# Patient Record
Sex: Female | Born: 1945 | Race: White | Hispanic: No | Marital: Married | Smoking: Former smoker
Health system: Southern US, Community
[De-identification: ages and names within clinical notes are randomized; demographics above are authoritative.]

## PROBLEM LIST (undated history)

## (undated) DIAGNOSIS — K648 Other hemorrhoids: Secondary | ICD-10-CM

## (undated) DIAGNOSIS — T1491XA Suicide attempt, initial encounter: Secondary | ICD-10-CM

## (undated) DIAGNOSIS — H9190 Unspecified hearing loss, unspecified ear: Secondary | ICD-10-CM

## (undated) DIAGNOSIS — Z8711 Personal history of peptic ulcer disease: Secondary | ICD-10-CM

## (undated) DIAGNOSIS — K56609 Unspecified intestinal obstruction, unspecified as to partial versus complete obstruction: Secondary | ICD-10-CM

## (undated) DIAGNOSIS — Z833 Family history of diabetes mellitus: Secondary | ICD-10-CM

## (undated) DIAGNOSIS — K5090 Crohn's disease, unspecified, without complications: Secondary | ICD-10-CM

## (undated) DIAGNOSIS — K439 Ventral hernia without obstruction or gangrene: Secondary | ICD-10-CM

## (undated) DIAGNOSIS — F329 Major depressive disorder, single episode, unspecified: Secondary | ICD-10-CM

## (undated) DIAGNOSIS — Z87442 Personal history of urinary calculi: Secondary | ICD-10-CM

## (undated) DIAGNOSIS — N390 Urinary tract infection, site not specified: Secondary | ICD-10-CM

## (undated) DIAGNOSIS — M545 Low back pain, unspecified: Secondary | ICD-10-CM

## (undated) DIAGNOSIS — D649 Anemia, unspecified: Secondary | ICD-10-CM

## (undated) DIAGNOSIS — K766 Portal hypertension: Secondary | ICD-10-CM

## (undated) DIAGNOSIS — F3289 Other specified depressive episodes: Secondary | ICD-10-CM

## (undated) DIAGNOSIS — K5900 Constipation, unspecified: Secondary | ICD-10-CM

## (undated) DIAGNOSIS — M5030 Other cervical disc degeneration, unspecified cervical region: Secondary | ICD-10-CM

## (undated) DIAGNOSIS — M797 Fibromyalgia: Secondary | ICD-10-CM

## (undated) DIAGNOSIS — K7460 Unspecified cirrhosis of liver: Secondary | ICD-10-CM

## (undated) DIAGNOSIS — Z8719 Personal history of other diseases of the digestive system: Secondary | ICD-10-CM

## (undated) DIAGNOSIS — I609 Nontraumatic subarachnoid hemorrhage, unspecified: Secondary | ICD-10-CM

## (undated) DIAGNOSIS — D509 Iron deficiency anemia, unspecified: Secondary | ICD-10-CM

## (undated) DIAGNOSIS — I10 Essential (primary) hypertension: Secondary | ICD-10-CM

## (undated) DIAGNOSIS — R161 Splenomegaly, not elsewhere classified: Secondary | ICD-10-CM

## (undated) DIAGNOSIS — E119 Type 2 diabetes mellitus without complications: Secondary | ICD-10-CM

## (undated) HISTORY — PX: PARTIAL COLECTOMY: SHX5273

## (undated) HISTORY — DX: Low back pain, unspecified: M54.50

## (undated) HISTORY — DX: Personal history of peptic ulcer disease: Z87.11

## (undated) HISTORY — DX: Nontraumatic subarachnoid hemorrhage, unspecified: I60.9

## (undated) HISTORY — DX: Personal history of urinary calculi: Z87.442

## (undated) HISTORY — DX: Low back pain: M54.5

## (undated) HISTORY — DX: Crohn's disease, unspecified, without complications: K50.90

## (undated) HISTORY — DX: Type 2 diabetes mellitus without complications: E11.9

## (undated) HISTORY — PX: HERNIA REPAIR: SHX51

## (undated) HISTORY — DX: Essential (primary) hypertension: I10

## (undated) HISTORY — DX: Morbid (severe) obesity due to excess calories: E66.01

## (undated) HISTORY — DX: Other specified depressive episodes: F32.89

## (undated) HISTORY — DX: Urinary tract infection, site not specified: N39.0

## (undated) HISTORY — DX: Fibromyalgia: M79.7

## (undated) HISTORY — DX: Splenomegaly, not elsewhere classified: R16.1

## (undated) HISTORY — DX: Portal hypertension: K76.6

## (undated) HISTORY — PX: LIVER BIOPSY: SHX301

## (undated) HISTORY — DX: Unspecified hearing loss, unspecified ear: H91.90

## (undated) HISTORY — PX: ABDOMINAL HYSTERECTOMY: SHX81

## (undated) HISTORY — DX: Personal history of other diseases of the digestive system: Z87.19

## (undated) HISTORY — DX: Ventral hernia without obstruction or gangrene: K43.9

## (undated) HISTORY — PX: OTHER SURGICAL HISTORY: SHX169

## (undated) HISTORY — PX: BREAST CYST EXCISION: SHX579

## (undated) HISTORY — DX: Other hemorrhoids: K64.8

## (undated) HISTORY — DX: Unspecified cirrhosis of liver: K74.60

## (undated) HISTORY — DX: Constipation, unspecified: K59.00

## (undated) HISTORY — DX: Other cervical disc degeneration, unspecified cervical region: M50.30

## (undated) HISTORY — PX: CHOLECYSTECTOMY: SHX55

## (undated) HISTORY — DX: Suicide attempt, initial encounter: T14.91XA

## (undated) HISTORY — PX: ORIF TIBIA & FIBULA FRACTURES: SHX2131

## (undated) HISTORY — DX: Family history of diabetes mellitus: Z83.3

## (undated) HISTORY — DX: Anemia, unspecified: D64.9

## (undated) HISTORY — DX: Unspecified intestinal obstruction, unspecified as to partial versus complete obstruction: K56.609

## (undated) HISTORY — DX: Major depressive disorder, single episode, unspecified: F32.9

## (undated) HISTORY — DX: Iron deficiency anemia, unspecified: D50.9

---

## 1998-01-21 ENCOUNTER — Inpatient Hospital Stay (HOSPITAL_COMMUNITY): Admission: EM | Admit: 1998-01-21 | Discharge: 1998-01-24 | Payer: Self-pay | Admitting: Emergency Medicine

## 1998-05-14 ENCOUNTER — Ambulatory Visit (HOSPITAL_BASED_OUTPATIENT_CLINIC_OR_DEPARTMENT_OTHER): Admission: RE | Admit: 1998-05-14 | Discharge: 1998-05-14 | Payer: Self-pay | Admitting: Orthopedic Surgery

## 1998-07-31 ENCOUNTER — Encounter: Payer: Self-pay | Admitting: Internal Medicine

## 1998-07-31 ENCOUNTER — Ambulatory Visit (HOSPITAL_COMMUNITY): Admission: RE | Admit: 1998-07-31 | Discharge: 1998-07-31 | Payer: Self-pay | Admitting: Internal Medicine

## 1998-12-18 ENCOUNTER — Other Ambulatory Visit: Admission: RE | Admit: 1998-12-18 | Discharge: 1998-12-18 | Payer: Self-pay | Admitting: Obstetrics and Gynecology

## 1999-05-07 ENCOUNTER — Encounter: Admission: RE | Admit: 1999-05-07 | Discharge: 1999-08-05 | Payer: Self-pay | Admitting: Internal Medicine

## 1999-08-19 ENCOUNTER — Encounter: Payer: Self-pay | Admitting: Internal Medicine

## 1999-08-19 ENCOUNTER — Encounter: Admission: RE | Admit: 1999-08-19 | Discharge: 1999-08-19 | Payer: Self-pay | Admitting: Internal Medicine

## 1999-11-14 ENCOUNTER — Ambulatory Visit (HOSPITAL_COMMUNITY): Admission: RE | Admit: 1999-11-14 | Discharge: 1999-11-14 | Payer: Self-pay | Admitting: Internal Medicine

## 1999-11-14 ENCOUNTER — Encounter: Payer: Self-pay | Admitting: Internal Medicine

## 2000-01-21 ENCOUNTER — Other Ambulatory Visit: Admission: RE | Admit: 2000-01-21 | Discharge: 2000-01-21 | Payer: Self-pay | Admitting: *Deleted

## 2000-01-31 ENCOUNTER — Encounter: Payer: Self-pay | Admitting: Internal Medicine

## 2000-01-31 ENCOUNTER — Encounter: Payer: Self-pay | Admitting: Emergency Medicine

## 2000-01-31 ENCOUNTER — Inpatient Hospital Stay (HOSPITAL_COMMUNITY): Admission: EM | Admit: 2000-01-31 | Discharge: 2000-02-01 | Payer: Self-pay | Admitting: Emergency Medicine

## 2000-02-24 ENCOUNTER — Ambulatory Visit (HOSPITAL_BASED_OUTPATIENT_CLINIC_OR_DEPARTMENT_OTHER): Admission: RE | Admit: 2000-02-24 | Discharge: 2000-02-24 | Payer: Self-pay

## 2000-04-04 DIAGNOSIS — T1491XA Suicide attempt, initial encounter: Secondary | ICD-10-CM

## 2000-04-04 HISTORY — DX: Suicide attempt, initial encounter: T14.91XA

## 2000-04-09 ENCOUNTER — Encounter: Payer: Self-pay | Admitting: Internal Medicine

## 2000-04-09 ENCOUNTER — Ambulatory Visit (HOSPITAL_COMMUNITY): Admission: RE | Admit: 2000-04-09 | Discharge: 2000-04-09 | Payer: Self-pay | Admitting: Internal Medicine

## 2000-04-26 ENCOUNTER — Emergency Department (HOSPITAL_COMMUNITY): Admission: EM | Admit: 2000-04-26 | Discharge: 2000-04-26 | Payer: Self-pay | Admitting: Emergency Medicine

## 2000-04-26 ENCOUNTER — Encounter: Payer: Self-pay | Admitting: Emergency Medicine

## 2000-04-28 ENCOUNTER — Ambulatory Visit (HOSPITAL_COMMUNITY): Admission: RE | Admit: 2000-04-28 | Discharge: 2000-04-28 | Payer: Self-pay | Admitting: Internal Medicine

## 2000-05-01 ENCOUNTER — Ambulatory Visit (HOSPITAL_COMMUNITY): Admission: RE | Admit: 2000-05-01 | Discharge: 2000-05-01 | Payer: Self-pay | Admitting: Internal Medicine

## 2000-05-01 ENCOUNTER — Encounter: Payer: Self-pay | Admitting: Internal Medicine

## 2000-05-27 ENCOUNTER — Encounter: Payer: Self-pay | Admitting: Internal Medicine

## 2000-05-27 ENCOUNTER — Ambulatory Visit (HOSPITAL_COMMUNITY): Admission: RE | Admit: 2000-05-27 | Discharge: 2000-05-27 | Payer: Self-pay | Admitting: Internal Medicine

## 2000-06-10 ENCOUNTER — Ambulatory Visit (HOSPITAL_COMMUNITY): Admission: RE | Admit: 2000-06-10 | Discharge: 2000-06-10 | Payer: Self-pay | Admitting: Internal Medicine

## 2000-06-10 ENCOUNTER — Encounter: Payer: Self-pay | Admitting: Internal Medicine

## 2000-07-27 ENCOUNTER — Encounter: Payer: Self-pay | Admitting: *Deleted

## 2000-07-27 ENCOUNTER — Encounter: Admission: RE | Admit: 2000-07-27 | Discharge: 2000-07-27 | Payer: Self-pay | Admitting: *Deleted

## 2000-11-21 ENCOUNTER — Inpatient Hospital Stay (HOSPITAL_COMMUNITY): Admission: EM | Admit: 2000-11-21 | Discharge: 2000-11-25 | Payer: Self-pay | Admitting: Emergency Medicine

## 2000-11-21 ENCOUNTER — Encounter: Payer: Self-pay | Admitting: Emergency Medicine

## 2000-11-22 ENCOUNTER — Encounter: Payer: Self-pay | Admitting: Pulmonary Disease

## 2001-06-07 ENCOUNTER — Emergency Department (HOSPITAL_COMMUNITY): Admission: EM | Admit: 2001-06-07 | Discharge: 2001-06-07 | Payer: Self-pay | Admitting: *Deleted

## 2001-06-07 ENCOUNTER — Ambulatory Visit (HOSPITAL_BASED_OUTPATIENT_CLINIC_OR_DEPARTMENT_OTHER): Admission: RE | Admit: 2001-06-07 | Discharge: 2001-06-07 | Payer: Self-pay | Admitting: Oral Surgery

## 2001-06-08 ENCOUNTER — Emergency Department (HOSPITAL_COMMUNITY): Admission: EM | Admit: 2001-06-08 | Discharge: 2001-06-08 | Payer: Self-pay | Admitting: Emergency Medicine

## 2001-06-22 ENCOUNTER — Ambulatory Visit (HOSPITAL_BASED_OUTPATIENT_CLINIC_OR_DEPARTMENT_OTHER): Admission: RE | Admit: 2001-06-22 | Discharge: 2001-06-22 | Payer: Self-pay | Admitting: Oral Surgery

## 2001-08-18 ENCOUNTER — Other Ambulatory Visit: Admission: RE | Admit: 2001-08-18 | Discharge: 2001-08-18 | Payer: Self-pay | Admitting: Obstetrics and Gynecology

## 2001-10-09 ENCOUNTER — Inpatient Hospital Stay (HOSPITAL_COMMUNITY): Admission: EM | Admit: 2001-10-09 | Discharge: 2001-10-12 | Payer: Self-pay | Admitting: Psychiatry

## 2001-10-22 ENCOUNTER — Encounter: Payer: Self-pay | Admitting: Emergency Medicine

## 2001-10-22 ENCOUNTER — Emergency Department (HOSPITAL_COMMUNITY): Admission: EM | Admit: 2001-10-22 | Discharge: 2001-10-22 | Payer: Self-pay | Admitting: Emergency Medicine

## 2001-11-01 ENCOUNTER — Ambulatory Visit (HOSPITAL_COMMUNITY): Admission: RE | Admit: 2001-11-01 | Discharge: 2001-11-01 | Payer: Self-pay | Admitting: Internal Medicine

## 2001-11-01 ENCOUNTER — Encounter: Payer: Self-pay | Admitting: Internal Medicine

## 2002-01-04 ENCOUNTER — Inpatient Hospital Stay (HOSPITAL_COMMUNITY): Admission: RE | Admit: 2002-01-04 | Discharge: 2002-01-10 | Payer: Self-pay

## 2002-01-07 ENCOUNTER — Encounter: Payer: Self-pay | Admitting: General Surgery

## 2002-02-17 ENCOUNTER — Ambulatory Visit (HOSPITAL_COMMUNITY): Admission: RE | Admit: 2002-02-17 | Discharge: 2002-02-17 | Payer: Self-pay

## 2002-03-14 ENCOUNTER — Inpatient Hospital Stay (HOSPITAL_COMMUNITY): Admission: RE | Admit: 2002-03-14 | Discharge: 2002-03-17 | Payer: Self-pay

## 2002-05-15 ENCOUNTER — Inpatient Hospital Stay (HOSPITAL_COMMUNITY): Admission: AD | Admit: 2002-05-15 | Discharge: 2002-06-02 | Payer: Self-pay | Admitting: General Surgery

## 2002-05-16 ENCOUNTER — Encounter: Payer: Self-pay | Admitting: General Surgery

## 2002-05-17 ENCOUNTER — Encounter: Payer: Self-pay | Admitting: General Surgery

## 2002-05-20 ENCOUNTER — Encounter: Payer: Self-pay | Admitting: General Surgery

## 2002-05-22 ENCOUNTER — Encounter: Payer: Self-pay | Admitting: General Surgery

## 2002-06-06 ENCOUNTER — Encounter: Payer: Self-pay | Admitting: Emergency Medicine

## 2002-06-06 ENCOUNTER — Inpatient Hospital Stay (HOSPITAL_COMMUNITY): Admission: EM | Admit: 2002-06-06 | Discharge: 2002-06-07 | Payer: Self-pay | Admitting: Emergency Medicine

## 2002-08-04 ENCOUNTER — Inpatient Hospital Stay (HOSPITAL_COMMUNITY): Admission: RE | Admit: 2002-08-04 | Discharge: 2002-08-08 | Payer: Self-pay

## 2002-12-07 ENCOUNTER — Inpatient Hospital Stay (HOSPITAL_COMMUNITY): Admission: RE | Admit: 2002-12-07 | Discharge: 2002-12-13 | Payer: Self-pay

## 2003-01-26 ENCOUNTER — Encounter: Admission: RE | Admit: 2003-01-26 | Discharge: 2003-01-26 | Payer: Self-pay

## 2003-01-29 ENCOUNTER — Ambulatory Visit (HOSPITAL_COMMUNITY): Admission: RE | Admit: 2003-01-29 | Discharge: 2003-01-29 | Payer: Self-pay

## 2003-02-12 ENCOUNTER — Ambulatory Visit (HOSPITAL_COMMUNITY): Admission: RE | Admit: 2003-02-12 | Discharge: 2003-02-12 | Payer: Self-pay

## 2003-03-07 ENCOUNTER — Ambulatory Visit (HOSPITAL_COMMUNITY): Admission: RE | Admit: 2003-03-07 | Discharge: 2003-03-07 | Payer: Self-pay

## 2003-03-14 ENCOUNTER — Inpatient Hospital Stay (HOSPITAL_COMMUNITY): Admission: RE | Admit: 2003-03-14 | Discharge: 2003-03-21 | Payer: Self-pay

## 2003-03-21 ENCOUNTER — Encounter: Payer: Self-pay | Admitting: Surgery

## 2003-07-10 ENCOUNTER — Ambulatory Visit (HOSPITAL_COMMUNITY): Admission: RE | Admit: 2003-07-10 | Discharge: 2003-07-10 | Payer: Self-pay | Admitting: Orthopedic Surgery

## 2003-07-10 ENCOUNTER — Ambulatory Visit (HOSPITAL_BASED_OUTPATIENT_CLINIC_OR_DEPARTMENT_OTHER): Admission: RE | Admit: 2003-07-10 | Discharge: 2003-07-10 | Payer: Self-pay | Admitting: Orthopedic Surgery

## 2003-08-13 ENCOUNTER — Ambulatory Visit (HOSPITAL_COMMUNITY): Admission: RE | Admit: 2003-08-13 | Discharge: 2003-08-13 | Payer: Self-pay | Admitting: Orthopedic Surgery

## 2003-11-08 ENCOUNTER — Other Ambulatory Visit: Admission: RE | Admit: 2003-11-08 | Discharge: 2003-11-08 | Payer: Self-pay | Admitting: Obstetrics and Gynecology

## 2003-11-19 ENCOUNTER — Inpatient Hospital Stay (HOSPITAL_COMMUNITY): Admission: EM | Admit: 2003-11-19 | Discharge: 2003-11-28 | Payer: Self-pay | Admitting: Internal Medicine

## 2003-11-22 ENCOUNTER — Encounter: Payer: Self-pay | Admitting: Gastroenterology

## 2003-11-22 ENCOUNTER — Encounter (INDEPENDENT_AMBULATORY_CARE_PROVIDER_SITE_OTHER): Payer: Self-pay | Admitting: *Deleted

## 2004-02-27 ENCOUNTER — Ambulatory Visit (HOSPITAL_COMMUNITY): Admission: RE | Admit: 2004-02-27 | Discharge: 2004-02-27 | Payer: Self-pay | Admitting: Internal Medicine

## 2004-02-27 ENCOUNTER — Encounter (INDEPENDENT_AMBULATORY_CARE_PROVIDER_SITE_OTHER): Payer: Self-pay | Admitting: *Deleted

## 2004-02-27 ENCOUNTER — Encounter: Payer: Self-pay | Admitting: Internal Medicine

## 2004-04-25 ENCOUNTER — Emergency Department (HOSPITAL_COMMUNITY): Admission: EM | Admit: 2004-04-25 | Discharge: 2004-04-26 | Payer: Self-pay | Admitting: Emergency Medicine

## 2004-04-29 ENCOUNTER — Inpatient Hospital Stay (HOSPITAL_COMMUNITY): Admission: AD | Admit: 2004-04-29 | Discharge: 2004-05-05 | Payer: Self-pay | Admitting: Internal Medicine

## 2004-05-02 ENCOUNTER — Encounter: Payer: Self-pay | Admitting: Urology

## 2004-05-18 ENCOUNTER — Emergency Department (HOSPITAL_COMMUNITY): Admission: EM | Admit: 2004-05-18 | Discharge: 2004-05-18 | Payer: Self-pay | Admitting: *Deleted

## 2004-07-11 ENCOUNTER — Emergency Department (HOSPITAL_COMMUNITY): Admission: EM | Admit: 2004-07-11 | Discharge: 2004-07-11 | Payer: Self-pay | Admitting: Emergency Medicine

## 2004-07-23 ENCOUNTER — Encounter (HOSPITAL_BASED_OUTPATIENT_CLINIC_OR_DEPARTMENT_OTHER): Admission: RE | Admit: 2004-07-23 | Discharge: 2004-08-26 | Payer: Self-pay | Admitting: Internal Medicine

## 2004-08-04 ENCOUNTER — Ambulatory Visit: Payer: Self-pay | Admitting: Internal Medicine

## 2004-08-04 ENCOUNTER — Ambulatory Visit (HOSPITAL_COMMUNITY): Admission: RE | Admit: 2004-08-04 | Discharge: 2004-08-04 | Payer: Self-pay | Admitting: Internal Medicine

## 2004-08-18 ENCOUNTER — Ambulatory Visit: Payer: Self-pay | Admitting: Internal Medicine

## 2004-09-15 ENCOUNTER — Ambulatory Visit: Payer: Self-pay | Admitting: Internal Medicine

## 2004-10-01 ENCOUNTER — Ambulatory Visit: Payer: Self-pay | Admitting: Internal Medicine

## 2004-10-02 ENCOUNTER — Ambulatory Visit: Payer: Self-pay | Admitting: Internal Medicine

## 2004-10-02 ENCOUNTER — Ambulatory Visit (HOSPITAL_COMMUNITY): Admission: RE | Admit: 2004-10-02 | Discharge: 2004-10-02 | Payer: Self-pay | Admitting: Internal Medicine

## 2004-10-23 ENCOUNTER — Ambulatory Visit: Payer: Self-pay | Admitting: Internal Medicine

## 2004-12-05 ENCOUNTER — Ambulatory Visit: Payer: Self-pay | Admitting: Internal Medicine

## 2004-12-06 ENCOUNTER — Inpatient Hospital Stay (HOSPITAL_COMMUNITY): Admission: EM | Admit: 2004-12-06 | Discharge: 2004-12-08 | Payer: Self-pay | Admitting: Emergency Medicine

## 2004-12-06 ENCOUNTER — Ambulatory Visit: Payer: Self-pay | Admitting: Internal Medicine

## 2004-12-16 ENCOUNTER — Ambulatory Visit: Payer: Self-pay | Admitting: Internal Medicine

## 2004-12-16 ENCOUNTER — Emergency Department (HOSPITAL_COMMUNITY): Admission: EM | Admit: 2004-12-16 | Discharge: 2004-12-16 | Payer: Self-pay | Admitting: Emergency Medicine

## 2004-12-22 ENCOUNTER — Ambulatory Visit: Payer: Self-pay | Admitting: Internal Medicine

## 2005-01-19 ENCOUNTER — Ambulatory Visit: Payer: Self-pay | Admitting: Internal Medicine

## 2005-01-26 ENCOUNTER — Ambulatory Visit: Payer: Self-pay | Admitting: Internal Medicine

## 2005-02-03 ENCOUNTER — Ambulatory Visit: Payer: Self-pay | Admitting: Endocrinology

## 2005-02-06 ENCOUNTER — Ambulatory Visit: Payer: Self-pay

## 2005-02-23 ENCOUNTER — Ambulatory Visit: Payer: Self-pay | Admitting: Internal Medicine

## 2005-02-23 ENCOUNTER — Ambulatory Visit: Payer: Self-pay | Admitting: Endocrinology

## 2005-02-23 ENCOUNTER — Ambulatory Visit (HOSPITAL_COMMUNITY): Admission: RE | Admit: 2005-02-23 | Discharge: 2005-02-23 | Payer: Self-pay | Admitting: Internal Medicine

## 2005-03-07 ENCOUNTER — Emergency Department (HOSPITAL_COMMUNITY): Admission: EM | Admit: 2005-03-07 | Discharge: 2005-03-07 | Payer: Self-pay | Admitting: Emergency Medicine

## 2005-03-09 ENCOUNTER — Ambulatory Visit: Payer: Self-pay | Admitting: Internal Medicine

## 2005-03-17 ENCOUNTER — Ambulatory Visit: Payer: Self-pay | Admitting: Internal Medicine

## 2005-03-20 ENCOUNTER — Ambulatory Visit: Payer: Self-pay | Admitting: Internal Medicine

## 2005-04-17 ENCOUNTER — Ambulatory Visit: Payer: Self-pay | Admitting: Endocrinology

## 2005-04-21 ENCOUNTER — Ambulatory Visit: Payer: Self-pay | Admitting: Internal Medicine

## 2005-04-24 ENCOUNTER — Ambulatory Visit (HOSPITAL_COMMUNITY): Admission: RE | Admit: 2005-04-24 | Discharge: 2005-04-24 | Payer: Self-pay | Admitting: Internal Medicine

## 2005-04-27 ENCOUNTER — Ambulatory Visit: Payer: Self-pay | Admitting: Internal Medicine

## 2005-05-06 ENCOUNTER — Ambulatory Visit: Payer: Self-pay | Admitting: Endocrinology

## 2005-05-06 ENCOUNTER — Inpatient Hospital Stay (HOSPITAL_COMMUNITY): Admission: EM | Admit: 2005-05-06 | Discharge: 2005-05-13 | Payer: Self-pay | Admitting: Emergency Medicine

## 2005-05-06 ENCOUNTER — Ambulatory Visit: Payer: Self-pay | Admitting: Gastroenterology

## 2005-05-07 ENCOUNTER — Encounter: Payer: Self-pay | Admitting: Gastroenterology

## 2005-05-07 ENCOUNTER — Encounter (INDEPENDENT_AMBULATORY_CARE_PROVIDER_SITE_OTHER): Payer: Self-pay | Admitting: Specialist

## 2005-05-20 ENCOUNTER — Ambulatory Visit: Payer: Self-pay | Admitting: Internal Medicine

## 2005-05-26 ENCOUNTER — Inpatient Hospital Stay (HOSPITAL_COMMUNITY): Admission: AD | Admit: 2005-05-26 | Discharge: 2005-05-28 | Payer: Self-pay | Admitting: Internal Medicine

## 2005-05-26 ENCOUNTER — Ambulatory Visit: Payer: Self-pay | Admitting: Internal Medicine

## 2005-06-02 ENCOUNTER — Ambulatory Visit: Payer: Self-pay | Admitting: Internal Medicine

## 2005-06-02 ENCOUNTER — Inpatient Hospital Stay (HOSPITAL_COMMUNITY): Admission: EM | Admit: 2005-06-02 | Discharge: 2005-06-05 | Payer: Self-pay | Admitting: Emergency Medicine

## 2005-06-12 ENCOUNTER — Ambulatory Visit: Payer: Self-pay | Admitting: Internal Medicine

## 2005-06-29 ENCOUNTER — Ambulatory Visit: Payer: Self-pay | Admitting: Internal Medicine

## 2005-07-13 ENCOUNTER — Ambulatory Visit: Payer: Self-pay | Admitting: Internal Medicine

## 2005-07-17 ENCOUNTER — Emergency Department (HOSPITAL_COMMUNITY): Admission: EM | Admit: 2005-07-17 | Discharge: 2005-07-17 | Payer: Self-pay | Admitting: *Deleted

## 2005-08-10 ENCOUNTER — Ambulatory Visit: Payer: Self-pay | Admitting: Internal Medicine

## 2005-09-07 ENCOUNTER — Ambulatory Visit: Payer: Self-pay | Admitting: Internal Medicine

## 2005-09-14 ENCOUNTER — Ambulatory Visit: Payer: Self-pay | Admitting: Internal Medicine

## 2005-10-12 ENCOUNTER — Ambulatory Visit: Payer: Self-pay | Admitting: Internal Medicine

## 2005-10-20 ENCOUNTER — Ambulatory Visit: Payer: Self-pay | Admitting: Internal Medicine

## 2005-10-20 ENCOUNTER — Observation Stay (HOSPITAL_COMMUNITY): Admission: EM | Admit: 2005-10-20 | Discharge: 2005-10-21 | Payer: Self-pay | Admitting: Emergency Medicine

## 2005-10-30 ENCOUNTER — Ambulatory Visit: Payer: Self-pay | Admitting: Internal Medicine

## 2005-11-16 ENCOUNTER — Ambulatory Visit: Payer: Self-pay | Admitting: Internal Medicine

## 2005-12-07 ENCOUNTER — Ambulatory Visit: Payer: Self-pay | Admitting: Internal Medicine

## 2006-01-11 ENCOUNTER — Ambulatory Visit: Payer: Self-pay | Admitting: Internal Medicine

## 2006-01-25 ENCOUNTER — Other Ambulatory Visit: Admission: RE | Admit: 2006-01-25 | Discharge: 2006-01-25 | Payer: Self-pay | Admitting: Gynecology

## 2006-02-04 ENCOUNTER — Ambulatory Visit: Payer: Self-pay | Admitting: Internal Medicine

## 2006-03-18 ENCOUNTER — Encounter: Admission: RE | Admit: 2006-03-18 | Discharge: 2006-03-18 | Payer: Self-pay

## 2006-03-19 ENCOUNTER — Inpatient Hospital Stay (HOSPITAL_COMMUNITY): Admission: AD | Admit: 2006-03-19 | Discharge: 2006-03-23 | Payer: Self-pay

## 2006-03-29 ENCOUNTER — Ambulatory Visit: Payer: Self-pay | Admitting: Internal Medicine

## 2006-04-01 ENCOUNTER — Encounter: Admission: RE | Admit: 2006-04-01 | Discharge: 2006-04-01 | Payer: Self-pay

## 2006-04-07 ENCOUNTER — Encounter: Payer: Self-pay | Admitting: Vascular Surgery

## 2006-04-07 ENCOUNTER — Inpatient Hospital Stay (HOSPITAL_COMMUNITY): Admission: EM | Admit: 2006-04-07 | Discharge: 2006-04-11 | Payer: Self-pay | Admitting: Family Medicine

## 2006-04-08 ENCOUNTER — Ambulatory Visit: Payer: Self-pay | Admitting: Internal Medicine

## 2006-04-26 ENCOUNTER — Ambulatory Visit: Payer: Self-pay | Admitting: Internal Medicine

## 2006-04-27 ENCOUNTER — Encounter: Payer: Self-pay | Admitting: Vascular Surgery

## 2006-04-28 ENCOUNTER — Inpatient Hospital Stay (HOSPITAL_COMMUNITY): Admission: AD | Admit: 2006-04-28 | Discharge: 2006-04-30 | Payer: Self-pay | Admitting: Internal Medicine

## 2006-04-29 ENCOUNTER — Ambulatory Visit: Payer: Self-pay | Admitting: Infectious Diseases

## 2006-05-03 ENCOUNTER — Inpatient Hospital Stay (HOSPITAL_COMMUNITY): Admission: EM | Admit: 2006-05-03 | Discharge: 2006-05-06 | Payer: Self-pay | Admitting: Emergency Medicine

## 2006-05-10 ENCOUNTER — Ambulatory Visit: Payer: Self-pay | Admitting: Internal Medicine

## 2006-05-12 ENCOUNTER — Ambulatory Visit: Payer: Self-pay | Admitting: Internal Medicine

## 2006-05-20 ENCOUNTER — Ambulatory Visit: Payer: Self-pay | Admitting: Internal Medicine

## 2006-06-04 ENCOUNTER — Ambulatory Visit: Payer: Self-pay | Admitting: Internal Medicine

## 2006-06-04 ENCOUNTER — Inpatient Hospital Stay (HOSPITAL_COMMUNITY): Admission: AD | Admit: 2006-06-04 | Discharge: 2006-06-09 | Payer: Self-pay | Admitting: Internal Medicine

## 2006-06-05 ENCOUNTER — Ambulatory Visit: Payer: Self-pay | Admitting: Internal Medicine

## 2006-06-15 ENCOUNTER — Ambulatory Visit: Payer: Self-pay | Admitting: Internal Medicine

## 2006-06-30 ENCOUNTER — Inpatient Hospital Stay (HOSPITAL_COMMUNITY): Admission: RE | Admit: 2006-06-30 | Discharge: 2006-07-01 | Payer: Self-pay

## 2006-07-12 ENCOUNTER — Ambulatory Visit: Payer: Self-pay | Admitting: Internal Medicine

## 2006-07-30 ENCOUNTER — Ambulatory Visit: Payer: Self-pay | Admitting: Internal Medicine

## 2006-08-30 ENCOUNTER — Ambulatory Visit: Payer: Self-pay | Admitting: Internal Medicine

## 2006-09-09 ENCOUNTER — Encounter: Admission: RE | Admit: 2006-09-09 | Discharge: 2006-09-09 | Payer: Self-pay

## 2006-09-09 ENCOUNTER — Encounter: Payer: Self-pay | Admitting: Internal Medicine

## 2006-09-24 ENCOUNTER — Ambulatory Visit: Payer: Self-pay | Admitting: Internal Medicine

## 2006-10-25 ENCOUNTER — Ambulatory Visit: Payer: Self-pay | Admitting: Internal Medicine

## 2006-11-22 ENCOUNTER — Ambulatory Visit: Payer: Self-pay | Admitting: Internal Medicine

## 2006-12-06 ENCOUNTER — Ambulatory Visit: Payer: Self-pay | Admitting: Internal Medicine

## 2006-12-06 LAB — CONVERTED CEMR LAB
AST: 21 units/L (ref 0–37)
Albumin: 3 g/dL — ABNORMAL LOW (ref 3.5–5.2)
BUN: 12 mg/dL (ref 6–23)
Basophils Absolute: 0 10*3/uL (ref 0.0–0.1)
Calcium: 9.4 mg/dL (ref 8.4–10.5)
Chloride: 101 meq/L (ref 96–112)
Cholesterol: 117 mg/dL (ref 0–200)
Eosinophils Absolute: 0.1 10*3/uL (ref 0.0–0.6)
GFR calc non Af Amer: 78 mL/min
Hgb A1c MFr Bld: 8.8 % — ABNORMAL HIGH (ref 4.6–6.0)
Iron: 59 ug/dL (ref 42–145)
MCHC: 34.1 g/dL (ref 30.0–36.0)
MCV: 89.1 fL (ref 78.0–100.0)
Monocytes Relative: 8.4 % (ref 3.0–11.0)
Platelets: 37 10*3/uL — CL (ref 150–400)
RBC: 2.89 M/uL — ABNORMAL LOW (ref 3.87–5.11)
Sodium: 135 meq/L (ref 135–145)
Total CHOL/HDL Ratio: 2.7
Triglycerides: 63 mg/dL (ref 0–149)

## 2006-12-07 ENCOUNTER — Ambulatory Visit: Payer: Self-pay | Admitting: Internal Medicine

## 2006-12-07 ENCOUNTER — Observation Stay (HOSPITAL_COMMUNITY): Admission: AD | Admit: 2006-12-07 | Discharge: 2006-12-09 | Payer: Self-pay | Admitting: Internal Medicine

## 2006-12-14 ENCOUNTER — Ambulatory Visit: Payer: Self-pay | Admitting: Gastroenterology

## 2006-12-17 ENCOUNTER — Ambulatory Visit: Payer: Self-pay | Admitting: Internal Medicine

## 2007-01-24 ENCOUNTER — Ambulatory Visit: Payer: Self-pay | Admitting: Internal Medicine

## 2007-01-24 LAB — CONVERTED CEMR LAB
ALT: 42 units/L — ABNORMAL HIGH (ref 0–40)
AST: 46 units/L — ABNORMAL HIGH (ref 0–37)
Albumin: 3.6 g/dL (ref 3.5–5.2)
Basophils Absolute: 0 10*3/uL (ref 0.0–0.1)
HCT: 42.8 % (ref 36.0–46.0)
INR: 1.4 (ref 0.9–2.0)
MCHC: 33.6 g/dL (ref 30.0–36.0)
Monocytes Relative: 7 % (ref 3.0–11.0)
Neutrophils Relative %: 67.9 % (ref 43.0–77.0)
RBC: 4.73 M/uL (ref 3.87–5.11)
RDW: 15.5 % — ABNORMAL HIGH (ref 11.5–14.6)

## 2007-02-04 ENCOUNTER — Inpatient Hospital Stay (HOSPITAL_COMMUNITY): Admission: EM | Admit: 2007-02-04 | Discharge: 2007-02-11 | Payer: Self-pay | Admitting: Emergency Medicine

## 2007-02-04 ENCOUNTER — Ambulatory Visit: Payer: Self-pay | Admitting: Internal Medicine

## 2007-02-09 ENCOUNTER — Ambulatory Visit: Payer: Self-pay | Admitting: Physical Medicine & Rehabilitation

## 2007-02-13 ENCOUNTER — Emergency Department (HOSPITAL_COMMUNITY): Admission: EM | Admit: 2007-02-13 | Discharge: 2007-02-13 | Payer: Self-pay | Admitting: Emergency Medicine

## 2007-03-02 ENCOUNTER — Encounter: Payer: Self-pay | Admitting: Endocrinology

## 2007-03-02 DIAGNOSIS — N390 Urinary tract infection, site not specified: Secondary | ICD-10-CM | POA: Insufficient documentation

## 2007-03-02 DIAGNOSIS — F329 Major depressive disorder, single episode, unspecified: Secondary | ICD-10-CM

## 2007-03-02 DIAGNOSIS — M545 Low back pain: Secondary | ICD-10-CM

## 2007-03-02 DIAGNOSIS — Z87442 Personal history of urinary calculi: Secondary | ICD-10-CM | POA: Insufficient documentation

## 2007-03-02 DIAGNOSIS — Z8719 Personal history of other diseases of the digestive system: Secondary | ICD-10-CM | POA: Insufficient documentation

## 2007-03-02 DIAGNOSIS — H9190 Unspecified hearing loss, unspecified ear: Secondary | ICD-10-CM | POA: Insufficient documentation

## 2007-03-02 DIAGNOSIS — I10 Essential (primary) hypertension: Secondary | ICD-10-CM | POA: Insufficient documentation

## 2007-03-02 DIAGNOSIS — E119 Type 2 diabetes mellitus without complications: Secondary | ICD-10-CM | POA: Insufficient documentation

## 2007-03-02 DIAGNOSIS — K7460 Unspecified cirrhosis of liver: Secondary | ICD-10-CM | POA: Insufficient documentation

## 2007-03-02 DIAGNOSIS — I609 Nontraumatic subarachnoid hemorrhage, unspecified: Secondary | ICD-10-CM

## 2007-03-02 DIAGNOSIS — D509 Iron deficiency anemia, unspecified: Secondary | ICD-10-CM

## 2007-03-29 ENCOUNTER — Ambulatory Visit: Payer: Self-pay | Admitting: Internal Medicine

## 2007-03-29 LAB — CONVERTED CEMR LAB
Albumin: 2.8 g/dL — ABNORMAL LOW (ref 3.5–5.2)
Basophils Absolute: 0 10*3/uL (ref 0.0–0.1)
Bilirubin, Direct: 0.3 mg/dL (ref 0.0–0.3)
Eosinophils Absolute: 0.1 10*3/uL (ref 0.0–0.6)
GFR calc non Af Amer: 68 mL/min
HCT: 33.3 % — ABNORMAL LOW (ref 36.0–46.0)
Hemoglobin: 11.1 g/dL — ABNORMAL LOW (ref 12.0–15.0)
Lymphocytes Relative: 33.8 % (ref 12.0–46.0)
MCHC: 33.3 g/dL (ref 30.0–36.0)
MCV: 91.5 fL (ref 78.0–100.0)
Monocytes Absolute: 0.4 10*3/uL (ref 0.2–0.7)
Neutrophils Relative %: 47.4 % (ref 43.0–77.0)
Potassium: 4.5 meq/L (ref 3.5–5.1)
Sodium: 134 meq/L — ABNORMAL LOW (ref 135–145)
TSH: 1.93 microintl units/mL (ref 0.35–5.50)
Total Bilirubin: 1.2 mg/dL (ref 0.3–1.2)

## 2007-04-14 ENCOUNTER — Ambulatory Visit: Payer: Self-pay | Admitting: Internal Medicine

## 2007-04-14 ENCOUNTER — Observation Stay (HOSPITAL_COMMUNITY): Admission: EM | Admit: 2007-04-14 | Discharge: 2007-04-17 | Payer: Self-pay | Admitting: Emergency Medicine

## 2007-04-14 ENCOUNTER — Ambulatory Visit: Payer: Self-pay | Admitting: Cardiology

## 2007-04-19 ENCOUNTER — Ambulatory Visit: Payer: Self-pay | Admitting: Internal Medicine

## 2007-06-23 ENCOUNTER — Ambulatory Visit: Payer: Self-pay | Admitting: Internal Medicine

## 2007-06-23 LAB — CONVERTED CEMR LAB
ALT: 31 units/L (ref 0–35)
AST: 30 units/L (ref 0–37)
Alkaline Phosphatase: 135 units/L — ABNORMAL HIGH (ref 39–117)
Basophils Relative: 0.3 % (ref 0.0–1.0)
Bilirubin, Direct: 0.3 mg/dL (ref 0.0–0.3)
CO2: 26 meq/L (ref 19–32)
Calcium: 10.3 mg/dL (ref 8.4–10.5)
Chloride: 107 meq/L (ref 96–112)
Creatinine, Ser: 0.7 mg/dL (ref 0.4–1.2)
Eosinophils Relative: 4 % (ref 0.0–5.0)
GFR calc Af Amer: 110 mL/min
Glucose, Bld: 355 mg/dL — ABNORMAL HIGH (ref 70–99)
Hemoglobin: 13.2 g/dL (ref 12.0–15.0)
Lymphocytes Relative: 26.5 % (ref 12.0–46.0)
Neutro Abs: 2.2 10*3/uL (ref 1.4–7.7)
Platelets: 53 10*3/uL — ABNORMAL LOW (ref 150–400)
RDW: 15.7 % — ABNORMAL HIGH (ref 11.5–14.6)
Total Bilirubin: 1.2 mg/dL (ref 0.3–1.2)
Total Protein: 7.8 g/dL (ref 6.0–8.3)
WBC: 3.4 10*3/uL — ABNORMAL LOW (ref 4.5–10.5)

## 2007-08-05 ENCOUNTER — Ambulatory Visit: Payer: Self-pay | Admitting: Internal Medicine

## 2007-08-10 ENCOUNTER — Other Ambulatory Visit: Admission: RE | Admit: 2007-08-10 | Discharge: 2007-08-10 | Payer: Self-pay | Admitting: Gynecology

## 2007-08-26 ENCOUNTER — Ambulatory Visit: Payer: Self-pay | Admitting: Internal Medicine

## 2007-12-07 ENCOUNTER — Ambulatory Visit: Payer: Self-pay | Admitting: Internal Medicine

## 2008-01-23 ENCOUNTER — Ambulatory Visit: Payer: Self-pay | Admitting: Internal Medicine

## 2008-01-25 LAB — CONVERTED CEMR LAB
ALT: 25 units/L (ref 0–35)
AST: 27 units/L (ref 0–37)
Albumin: 3.2 g/dL — ABNORMAL LOW (ref 3.5–5.2)
Ammonia: 40 umol/L — ABNORMAL HIGH (ref 11–35)
BUN: 12 mg/dL (ref 6–23)
Basophils Absolute: 0 10*3/uL (ref 0.0–0.1)
Basophils Relative: 0.2 % (ref 0.0–1.0)
CO2: 25 meq/L (ref 19–32)
Calcium: 9.5 mg/dL (ref 8.4–10.5)
Chloride: 110 meq/L (ref 96–112)
Creatinine, Ser: 0.9 mg/dL (ref 0.4–1.2)
Eosinophils Absolute: 0.1 10*3/uL (ref 0.0–0.7)
Eosinophils Relative: 4.3 % (ref 0.0–5.0)
Hemoglobin: 13.1 g/dL (ref 12.0–15.0)
Hgb A1c MFr Bld: 9.3 % — ABNORMAL HIGH (ref 4.6–6.0)
MCHC: 33.3 g/dL (ref 30.0–36.0)
MCV: 89.4 fL (ref 78.0–100.0)
Mucus, UA: NEGATIVE
Neutro Abs: 1.2 10*3/uL — ABNORMAL LOW (ref 1.4–7.7)
Neutrophils Relative %: 61 % (ref 43.0–77.0)
RBC: 4.4 M/uL (ref 3.87–5.11)
Total Protein, Urine: 30 mg/dL — AB
Total Protein: 7.2 g/dL (ref 6.0–8.3)
Urine Glucose: 250 mg/dL — AB
WBC: 2 10*3/uL — ABNORMAL LOW (ref 4.5–10.5)
pH: 5.5 (ref 5.0–8.0)

## 2008-01-27 ENCOUNTER — Ambulatory Visit: Payer: Self-pay | Admitting: Internal Medicine

## 2008-03-16 ENCOUNTER — Encounter: Payer: Self-pay | Admitting: Internal Medicine

## 2008-07-23 ENCOUNTER — Ambulatory Visit: Payer: Self-pay | Admitting: Internal Medicine

## 2008-08-14 ENCOUNTER — Encounter: Payer: Self-pay | Admitting: Internal Medicine

## 2008-09-03 ENCOUNTER — Ambulatory Visit: Payer: Self-pay | Admitting: Internal Medicine

## 2008-09-03 DIAGNOSIS — L719 Rosacea, unspecified: Secondary | ICD-10-CM

## 2008-09-07 LAB — CONVERTED CEMR LAB
ALT: 32 units/L (ref 0–35)
AST: 29 units/L (ref 0–37)
Albumin: 3.3 g/dL — ABNORMAL LOW (ref 3.5–5.2)
BUN: 11 mg/dL (ref 6–23)
CO2: 26 meq/L (ref 19–32)
Calcium: 10.1 mg/dL (ref 8.4–10.5)
Chloride: 110 meq/L (ref 96–112)
Creatinine, Ser: 0.8 mg/dL (ref 0.4–1.2)
Glucose, Bld: 231 mg/dL — ABNORMAL HIGH (ref 70–99)
Hgb A1c MFr Bld: 7.6 % — ABNORMAL HIGH (ref 4.6–6.0)
Total Bilirubin: 1.1 mg/dL (ref 0.3–1.2)
Total Protein: 7.4 g/dL (ref 6.0–8.3)

## 2008-10-30 ENCOUNTER — Telehealth: Payer: Self-pay | Admitting: Internal Medicine

## 2008-10-31 ENCOUNTER — Ambulatory Visit: Payer: Self-pay | Admitting: Gastroenterology

## 2008-10-31 DIAGNOSIS — K5010 Crohn's disease of large intestine without complications: Secondary | ICD-10-CM

## 2008-10-31 DIAGNOSIS — K5900 Constipation, unspecified: Secondary | ICD-10-CM | POA: Insufficient documentation

## 2008-10-31 DIAGNOSIS — K56609 Unspecified intestinal obstruction, unspecified as to partial versus complete obstruction: Secondary | ICD-10-CM | POA: Insufficient documentation

## 2008-11-01 ENCOUNTER — Telehealth: Payer: Self-pay | Admitting: Nurse Practitioner

## 2008-11-01 ENCOUNTER — Encounter: Payer: Self-pay | Admitting: Nurse Practitioner

## 2008-11-06 ENCOUNTER — Telehealth: Payer: Self-pay | Admitting: Physician Assistant

## 2008-11-07 DIAGNOSIS — Z8711 Personal history of peptic ulcer disease: Secondary | ICD-10-CM

## 2008-11-07 DIAGNOSIS — K439 Ventral hernia without obstruction or gangrene: Secondary | ICD-10-CM | POA: Insufficient documentation

## 2008-11-07 DIAGNOSIS — M5030 Other cervical disc degeneration, unspecified cervical region: Secondary | ICD-10-CM | POA: Insufficient documentation

## 2008-11-12 ENCOUNTER — Telehealth: Payer: Self-pay | Admitting: Internal Medicine

## 2008-11-12 ENCOUNTER — Encounter: Payer: Self-pay | Admitting: Gastroenterology

## 2008-11-12 ENCOUNTER — Ambulatory Visit: Payer: Self-pay | Admitting: Internal Medicine

## 2008-11-13 ENCOUNTER — Encounter: Payer: Self-pay | Admitting: Internal Medicine

## 2008-11-13 ENCOUNTER — Ambulatory Visit: Payer: Self-pay | Admitting: Internal Medicine

## 2008-11-17 ENCOUNTER — Encounter: Payer: Self-pay | Admitting: Internal Medicine

## 2008-12-03 ENCOUNTER — Ambulatory Visit: Payer: Self-pay | Admitting: Internal Medicine

## 2008-12-04 LAB — CONVERTED CEMR LAB
ALT: 36 units/L — ABNORMAL HIGH (ref 0–35)
Basophils Absolute: 0 10*3/uL (ref 0.0–0.1)
Basophils Relative: 0.6 % (ref 0.0–3.0)
CO2: 25 meq/L (ref 19–32)
Calcium: 9.6 mg/dL (ref 8.4–10.5)
Creatinine, Ser: 0.8 mg/dL (ref 0.4–1.2)
Ferritin: 62.8 ng/mL (ref 10.0–291.0)
GFR calc Af Amer: 94 mL/min
Glucose, Bld: 208 mg/dL — ABNORMAL HIGH (ref 70–99)
HCT: 41 % (ref 36.0–46.0)
Hemoglobin: 14 g/dL (ref 12.0–15.0)
Lymphocytes Relative: 19.5 % (ref 12.0–46.0)
MCHC: 34.2 g/dL (ref 30.0–36.0)
Monocytes Absolute: 0.2 10*3/uL (ref 0.1–1.0)
Neutro Abs: 2.7 10*3/uL (ref 1.4–7.7)
Prothrombin Time: 11.8 s (ref 10.9–13.3)
RBC: 4.53 M/uL (ref 3.87–5.11)
Total Bilirubin: 1.8 mg/dL — ABNORMAL HIGH (ref 0.3–1.2)
Total Protein: 7.4 g/dL (ref 6.0–8.3)
Vitamin B-12: 411 pg/mL (ref 211–911)

## 2009-01-28 ENCOUNTER — Ambulatory Visit: Payer: Self-pay | Admitting: Internal Medicine

## 2009-01-28 DIAGNOSIS — R1012 Left upper quadrant pain: Secondary | ICD-10-CM | POA: Insufficient documentation

## 2009-01-29 ENCOUNTER — Encounter: Payer: Self-pay | Admitting: Internal Medicine

## 2009-02-12 ENCOUNTER — Encounter: Admission: RE | Admit: 2009-02-12 | Discharge: 2009-02-12 | Payer: Self-pay | Admitting: Internal Medicine

## 2009-04-02 ENCOUNTER — Ambulatory Visit: Payer: Self-pay | Admitting: Internal Medicine

## 2009-04-05 LAB — CONVERTED CEMR LAB
Albumin: 3 g/dL — ABNORMAL LOW (ref 3.5–5.2)
Ammonia: 61 umol/L — ABNORMAL HIGH (ref 11–35)
CO2: 29 meq/L (ref 19–32)
Calcium: 9.6 mg/dL (ref 8.4–10.5)
Creatinine, Ser: 0.8 mg/dL (ref 0.4–1.2)
Glucose, Bld: 340 mg/dL — ABNORMAL HIGH (ref 70–99)
INR: 1.2 — ABNORMAL HIGH (ref 0.8–1.0)
Prothrombin Time: 12.5 s (ref 10.9–13.3)
Total Protein: 7.3 g/dL (ref 6.0–8.3)

## 2009-06-03 ENCOUNTER — Emergency Department (HOSPITAL_COMMUNITY): Admission: EM | Admit: 2009-06-03 | Discharge: 2009-06-03 | Payer: Self-pay | Admitting: Emergency Medicine

## 2009-06-03 ENCOUNTER — Telehealth: Payer: Self-pay | Admitting: Internal Medicine

## 2009-06-04 ENCOUNTER — Ambulatory Visit: Payer: Self-pay | Admitting: Internal Medicine

## 2009-06-04 DIAGNOSIS — E860 Dehydration: Secondary | ICD-10-CM | POA: Insufficient documentation

## 2009-06-07 ENCOUNTER — Telehealth: Payer: Self-pay | Admitting: Internal Medicine

## 2009-06-13 ENCOUNTER — Ambulatory Visit: Payer: Self-pay | Admitting: Internal Medicine

## 2009-06-14 LAB — CONVERTED CEMR LAB
AST: 34 units/L (ref 0–37)
Albumin: 3.1 g/dL — ABNORMAL LOW (ref 3.5–5.2)
BUN: 17 mg/dL (ref 6–23)
Calcium: 9.8 mg/dL (ref 8.4–10.5)
Cholesterol: 159 mg/dL (ref 0–200)
GFR calc non Af Amer: 77.36 mL/min (ref >60)
Glucose, Bld: 217 mg/dL — ABNORMAL HIGH (ref 70–99)
Nitrite: NEGATIVE
Potassium: 4.4 meq/L (ref 3.5–5.1)
Prothrombin Time: 11.3 s (ref 9.1–11.7)
Specific Gravity, Urine: 1.025 (ref 1.000–1.030)
Urine Glucose: >=1000 mg/dL
Urobilinogen, UA: 0.2 (ref 0.0–1.0)
VLDL: 7.4 mg/dL (ref 0.0–40.0)

## 2009-06-17 ENCOUNTER — Ambulatory Visit: Payer: Self-pay | Admitting: Internal Medicine

## 2009-06-25 ENCOUNTER — Telehealth: Payer: Self-pay | Admitting: Internal Medicine

## 2009-06-26 ENCOUNTER — Telehealth: Payer: Self-pay | Admitting: Internal Medicine

## 2009-07-31 ENCOUNTER — Ambulatory Visit: Payer: Self-pay | Admitting: Internal Medicine

## 2009-07-31 DIAGNOSIS — R1011 Right upper quadrant pain: Secondary | ICD-10-CM

## 2009-07-31 LAB — CONVERTED CEMR LAB: Blood Glucose, Fingerstick: 281

## 2009-08-01 LAB — CONVERTED CEMR LAB
Albumin: 3.2 g/dL — ABNORMAL LOW (ref 3.5–5.2)
BUN: 14 mg/dL (ref 6–23)
Basophils Relative: 0.1 % (ref 0.0–3.0)
CO2: 28 meq/L (ref 19–32)
Calcium: 9.9 mg/dL (ref 8.4–10.5)
Creatinine, Ser: 1 mg/dL (ref 0.4–1.2)
Eosinophils Relative: 5 % (ref 0.0–5.0)
Glucose, Bld: 300 mg/dL — ABNORMAL HIGH (ref 70–99)
Lipase: 43 units/L (ref 11.0–59.0)
Lymphocytes Relative: 18.4 % (ref 12.0–46.0)
Neutrophils Relative %: 70.4 % (ref 43.0–77.0)
Nitrite: NEGATIVE
Platelets: 42 10*3/uL — CL (ref 150.0–400.0)
RBC: 4.35 M/uL (ref 3.87–5.11)
Sed Rate: 18 mm/hr (ref 0–22)
Total Protein, Urine: NEGATIVE mg/dL
Total Protein: 7.2 g/dL (ref 6.0–8.3)
WBC: 2.7 10*3/uL — ABNORMAL LOW (ref 4.5–10.5)
pH: 6.5 (ref 5.0–8.0)

## 2009-08-02 ENCOUNTER — Telehealth: Payer: Self-pay | Admitting: Internal Medicine

## 2009-08-06 ENCOUNTER — Ambulatory Visit: Payer: Self-pay | Admitting: Internal Medicine

## 2009-10-11 ENCOUNTER — Ambulatory Visit: Payer: Self-pay | Admitting: Internal Medicine

## 2009-10-11 LAB — CONVERTED CEMR LAB
ALT: 29 units/L (ref 0–35)
AST: 34 units/L (ref 0–37)
Albumin: 3.2 g/dL — ABNORMAL LOW (ref 3.5–5.2)
BUN: 17 mg/dL (ref 6–23)
Basophils Absolute: 0 10*3/uL (ref 0.0–0.1)
Chloride: 106 meq/L (ref 96–112)
Creatinine, Ser: 1 mg/dL (ref 0.4–1.2)
Eosinophils Relative: 6 % — ABNORMAL HIGH (ref 0–5)
Glucose, Bld: 144 mg/dL — ABNORMAL HIGH (ref 70–99)
HCT: 43.5 % (ref 36.0–46.0)
Hgb A1c MFr Bld: 8.9 % — ABNORMAL HIGH (ref 4.6–6.5)
Lymphocytes Relative: 31 % (ref 12–46)
Lymphs Abs: 1.1 10*3/uL (ref 0.7–4.0)
Neutro Abs: 2 10*3/uL (ref 1.7–7.7)
Neutrophils Relative %: 56 % (ref 43–77)
Platelets: 41 10*3/uL — CL (ref 150–400)
Potassium: 4.4 meq/L (ref 3.5–5.1)
RDW: 14.8 % (ref 11.5–15.5)
Total Bilirubin: 1.5 mg/dL — ABNORMAL HIGH (ref 0.3–1.2)
Total Protein: 7.8 g/dL (ref 6.0–8.3)
WBC: 3.6 10*3/uL — ABNORMAL LOW (ref 4.0–10.5)

## 2009-10-29 ENCOUNTER — Ambulatory Visit: Payer: Self-pay | Admitting: Internal Medicine

## 2009-10-29 DIAGNOSIS — Z87891 Personal history of nicotine dependence: Secondary | ICD-10-CM

## 2009-12-03 ENCOUNTER — Ambulatory Visit: Payer: Self-pay | Admitting: Internal Medicine

## 2009-12-03 ENCOUNTER — Telehealth: Payer: Self-pay | Admitting: Internal Medicine

## 2009-12-03 LAB — CONVERTED CEMR LAB
AST: 26 units/L (ref 0–37)
Alkaline Phosphatase: 64 units/L (ref 39–117)
BUN: 15 mg/dL (ref 6–23)
Basophils Absolute: 0 10*3/uL (ref 0.0–0.1)
Basophils Relative: 0.5 % (ref 0.0–3.0)
Bilirubin, Direct: 0.3 mg/dL (ref 0.0–0.3)
Chloride: 108 meq/L (ref 96–112)
Eosinophils Absolute: 0.2 10*3/uL (ref 0.0–0.7)
GFR calc non Af Amer: 67.43 mL/min (ref >60)
Hemoglobin: 13.6 g/dL (ref 12.0–15.0)
Hgb A1c MFr Bld: 9.6 % — ABNORMAL HIGH (ref 4.6–6.5)
Lymphocytes Relative: 23.4 % (ref 12.0–46.0)
MCHC: 33.1 g/dL (ref 30.0–36.0)
Monocytes Relative: 8.8 % (ref 3.0–12.0)
Neutro Abs: 1.8 10*3/uL (ref 1.4–7.7)
Neutrophils Relative %: 60.5 % (ref 43.0–77.0)
Potassium: 4.2 meq/L (ref 3.5–5.1)
RBC: 4.34 M/uL (ref 3.87–5.11)
Sodium: 139 meq/L (ref 135–145)
Vitamin B-12: 392 pg/mL (ref 211–911)

## 2009-12-10 ENCOUNTER — Ambulatory Visit: Payer: Self-pay | Admitting: Internal Medicine

## 2009-12-30 ENCOUNTER — Encounter: Payer: Self-pay | Admitting: Internal Medicine

## 2010-01-03 ENCOUNTER — Ambulatory Visit: Payer: Self-pay | Admitting: Internal Medicine

## 2010-01-03 DIAGNOSIS — M25569 Pain in unspecified knee: Secondary | ICD-10-CM

## 2010-01-03 DIAGNOSIS — D696 Thrombocytopenia, unspecified: Secondary | ICD-10-CM

## 2010-01-22 ENCOUNTER — Telehealth: Payer: Self-pay | Admitting: Internal Medicine

## 2010-02-20 ENCOUNTER — Ambulatory Visit: Payer: Self-pay | Admitting: Internal Medicine

## 2010-02-21 LAB — CONVERTED CEMR LAB
ALT: 22 units/L (ref 0–35)
AST: 25 units/L (ref 0–37)
Albumin: 3.2 g/dL — ABNORMAL LOW (ref 3.5–5.2)
Alkaline Phosphatase: 55 units/L (ref 39–117)
BUN: 15 mg/dL (ref 6–23)
Basophils Absolute: 0 10*3/uL (ref 0.0–0.1)
CO2: 24 meq/L (ref 19–32)
Eosinophils Absolute: 0.2 10*3/uL (ref 0.0–0.7)
Glucose, Bld: 201 mg/dL — ABNORMAL HIGH (ref 70–99)
Hgb A1c MFr Bld: 9.2 % — ABNORMAL HIGH (ref 4.6–6.5)
INR: 1.2 — ABNORMAL HIGH (ref 0.8–1.0)
Lymphocytes Relative: 30.3 % (ref 12.0–46.0)
MCHC: 34.4 g/dL (ref 30.0–36.0)
Neutro Abs: 1.3 10*3/uL — ABNORMAL LOW (ref 1.4–7.7)
Neutrophils Relative %: 50.9 % (ref 43.0–77.0)
Platelets: 36 10*3/uL — CL (ref 150.0–400.0)
Potassium: 4.1 meq/L (ref 3.5–5.1)
Prothrombin Time: 12 s — ABNORMAL HIGH (ref 9.1–11.7)
RDW: 16.6 % — ABNORMAL HIGH (ref 11.5–14.6)
Sodium: 136 meq/L (ref 135–145)
TSH: 3.39 microintl units/mL (ref 0.35–5.50)
Total Protein: 6.8 g/dL (ref 6.0–8.3)

## 2010-02-25 ENCOUNTER — Ambulatory Visit: Payer: Self-pay | Admitting: Internal Medicine

## 2010-02-25 DIAGNOSIS — M79609 Pain in unspecified limb: Secondary | ICD-10-CM | POA: Insufficient documentation

## 2010-02-25 DIAGNOSIS — R21 Rash and other nonspecific skin eruption: Secondary | ICD-10-CM | POA: Insufficient documentation

## 2010-03-05 ENCOUNTER — Encounter (INDEPENDENT_AMBULATORY_CARE_PROVIDER_SITE_OTHER): Payer: Self-pay | Admitting: *Deleted

## 2010-03-21 ENCOUNTER — Ambulatory Visit: Payer: Self-pay | Admitting: Internal Medicine

## 2010-04-11 ENCOUNTER — Ambulatory Visit: Payer: Self-pay | Admitting: Internal Medicine

## 2010-04-11 DIAGNOSIS — L03119 Cellulitis of unspecified part of limb: Secondary | ICD-10-CM

## 2010-04-11 DIAGNOSIS — L02419 Cutaneous abscess of limb, unspecified: Secondary | ICD-10-CM | POA: Insufficient documentation

## 2010-04-22 ENCOUNTER — Ambulatory Visit: Payer: Self-pay | Admitting: Internal Medicine

## 2010-04-22 ENCOUNTER — Telehealth: Payer: Self-pay | Admitting: Internal Medicine

## 2010-04-22 LAB — CONVERTED CEMR LAB
Bilirubin, Direct: 0.3 mg/dL (ref 0.0–0.3)
CO2: 28 meq/L (ref 19–32)
Calcium: 9.7 mg/dL (ref 8.4–10.5)
Creatinine, Ser: 0.8 mg/dL (ref 0.4–1.2)
Eosinophils Relative: 6.5 % — ABNORMAL HIGH (ref 0.0–5.0)
GFR calc non Af Amer: 78.27 mL/min (ref >60)
HCT: 41 % (ref 36.0–46.0)
INR: 1.1 — ABNORMAL HIGH (ref 0.8–1.0)
Lymphs Abs: 0.7 10*3/uL (ref 0.7–4.0)
Monocytes Relative: 8.1 % (ref 3.0–12.0)
Neutrophils Relative %: 62.1 % (ref 43.0–77.0)
Platelets: 37 10*3/uL — CL (ref 150.0–400.0)
Rh Type: NEGATIVE
Total Protein: 7.2 g/dL (ref 6.0–8.3)
WBC: 3 10*3/uL — ABNORMAL LOW (ref 4.5–10.5)

## 2010-04-28 ENCOUNTER — Ambulatory Visit: Payer: Self-pay | Admitting: Internal Medicine

## 2010-06-03 ENCOUNTER — Telehealth: Payer: Self-pay | Admitting: Internal Medicine

## 2010-06-03 ENCOUNTER — Ambulatory Visit: Payer: Self-pay | Admitting: Cardiovascular Disease

## 2010-06-03 ENCOUNTER — Ambulatory Visit: Payer: Self-pay | Admitting: Internal Medicine

## 2010-06-03 DIAGNOSIS — R519 Headache, unspecified: Secondary | ICD-10-CM | POA: Insufficient documentation

## 2010-06-03 DIAGNOSIS — R51 Headache: Secondary | ICD-10-CM

## 2010-06-03 DIAGNOSIS — R040 Epistaxis: Secondary | ICD-10-CM | POA: Insufficient documentation

## 2010-06-04 LAB — CONVERTED CEMR LAB
AST: 32 units/L (ref 0–37)
Albumin: 2.9 g/dL — ABNORMAL LOW (ref 3.5–5.2)
Alkaline Phosphatase: 65 units/L (ref 39–117)
Basophils Absolute: 0 10*3/uL (ref 0.0–0.1)
Basophils Relative: 0.2 % (ref 0.0–3.0)
Chloride: 107 meq/L (ref 96–112)
Eosinophils Relative: 5.6 % — ABNORMAL HIGH (ref 0.0–5.0)
HCT: 39.6 % (ref 36.0–46.0)
Lymphs Abs: 0.7 10*3/uL (ref 0.7–4.0)
MCV: 96.6 fL (ref 78.0–100.0)
Monocytes Absolute: 0.2 10*3/uL (ref 0.1–1.0)
Potassium: 4.3 meq/L (ref 3.5–5.1)
RBC: 4.1 M/uL (ref 3.87–5.11)
Total Bilirubin: 1.3 mg/dL — ABNORMAL HIGH (ref 0.3–1.2)
WBC: 3 10*3/uL — ABNORMAL LOW (ref 4.5–10.5)

## 2010-06-10 ENCOUNTER — Telehealth: Payer: Self-pay | Admitting: Internal Medicine

## 2010-06-18 ENCOUNTER — Encounter: Payer: Self-pay | Admitting: Internal Medicine

## 2010-06-24 ENCOUNTER — Ambulatory Visit: Payer: Self-pay | Admitting: Internal Medicine

## 2010-06-24 DIAGNOSIS — J209 Acute bronchitis, unspecified: Secondary | ICD-10-CM | POA: Insufficient documentation

## 2010-06-26 ENCOUNTER — Telehealth: Payer: Self-pay | Admitting: Internal Medicine

## 2010-06-30 ENCOUNTER — Telehealth: Payer: Self-pay | Admitting: Internal Medicine

## 2010-07-01 ENCOUNTER — Ambulatory Visit: Payer: Self-pay | Admitting: Internal Medicine

## 2010-07-01 DIAGNOSIS — R609 Edema, unspecified: Secondary | ICD-10-CM

## 2010-07-04 ENCOUNTER — Telehealth: Payer: Self-pay | Admitting: Internal Medicine

## 2010-07-10 ENCOUNTER — Telehealth: Payer: Self-pay | Admitting: Internal Medicine

## 2010-07-10 ENCOUNTER — Ambulatory Visit: Payer: Self-pay | Admitting: Internal Medicine

## 2010-07-10 LAB — CONVERTED CEMR LAB
ALT: 35 units/L (ref 0–35)
AST: 52 units/L — ABNORMAL HIGH (ref 0–37)
Albumin: 2.8 g/dL — ABNORMAL LOW (ref 3.5–5.2)
Alkaline Phosphatase: 99 units/L (ref 39–117)
Basophils Absolute: 0 10*3/uL (ref 0.0–0.1)
Chloride: 100 meq/L (ref 96–112)
GFR calc non Af Amer: 79.38 mL/min (ref >60)
Glucose, Bld: 445 mg/dL — ABNORMAL HIGH (ref 70–99)
HCT: 38.8 % (ref 36.0–46.0)
Lymphs Abs: 0.5 10*3/uL — ABNORMAL LOW (ref 0.7–4.0)
MCV: 94.9 fL (ref 78.0–100.0)
Monocytes Absolute: 0.2 10*3/uL (ref 0.1–1.0)
Neutrophils Relative %: 58.8 % (ref 43.0–77.0)
Platelets: 38 10*3/uL — CL (ref 150.0–400.0)
Potassium: 4.4 meq/L (ref 3.5–5.1)
RDW: 16.6 % — ABNORMAL HIGH (ref 11.5–14.6)
Sodium: 135 meq/L (ref 135–145)
Total Bilirubin: 1.2 mg/dL (ref 0.3–1.2)

## 2010-07-14 ENCOUNTER — Telehealth: Payer: Self-pay | Admitting: Internal Medicine

## 2010-07-15 ENCOUNTER — Ambulatory Visit: Payer: Self-pay | Admitting: Internal Medicine

## 2010-08-05 ENCOUNTER — Ambulatory Visit: Payer: Self-pay | Admitting: Internal Medicine

## 2010-08-05 DIAGNOSIS — R319 Hematuria, unspecified: Secondary | ICD-10-CM | POA: Insufficient documentation

## 2010-08-05 LAB — CONVERTED CEMR LAB
Basophils Absolute: 0 10*3/uL (ref 0.0–0.1)
Basophils Relative: 0.1 % (ref 0.0–3.0)
Calcium: 10.4 mg/dL (ref 8.4–10.5)
Eosinophils Relative: 8.1 % — ABNORMAL HIGH (ref 0.0–5.0)
GFR calc non Af Amer: 70.9 mL/min (ref >60)
Hemoglobin: 13.6 g/dL (ref 12.0–15.0)
Lymphocytes Relative: 24 % (ref 12.0–46.0)
Monocytes Relative: 8.4 % (ref 3.0–12.0)
Neutro Abs: 2.1 10*3/uL (ref 1.4–7.7)
Potassium: 4.4 meq/L (ref 3.5–5.1)
RBC: 4.23 M/uL (ref 3.87–5.11)
Sodium: 138 meq/L (ref 135–145)
WBC: 3.5 10*3/uL — ABNORMAL LOW (ref 4.5–10.5)

## 2010-08-06 ENCOUNTER — Telehealth: Payer: Self-pay | Admitting: Internal Medicine

## 2010-08-08 ENCOUNTER — Ambulatory Visit: Payer: Self-pay | Admitting: Internal Medicine

## 2010-08-11 LAB — CONVERTED CEMR LAB
Nitrite: NEGATIVE
Specific Gravity, Urine: 1.01 (ref 1.000–1.030)
Total Protein, Urine: NEGATIVE mg/dL

## 2010-08-15 ENCOUNTER — Ambulatory Visit: Payer: Self-pay | Admitting: Internal Medicine

## 2010-08-15 DIAGNOSIS — R1031 Right lower quadrant pain: Secondary | ICD-10-CM

## 2010-08-21 ENCOUNTER — Ambulatory Visit: Payer: Self-pay | Admitting: Cardiovascular Disease

## 2010-08-22 ENCOUNTER — Telehealth: Payer: Self-pay | Admitting: Internal Medicine

## 2010-08-26 ENCOUNTER — Encounter: Payer: Self-pay | Admitting: Internal Medicine

## 2010-09-09 ENCOUNTER — Ambulatory Visit: Payer: Self-pay | Admitting: Internal Medicine

## 2010-09-15 ENCOUNTER — Telehealth: Payer: Self-pay | Admitting: Internal Medicine

## 2010-09-15 LAB — CONVERTED CEMR LAB
Albumin: 2.9 g/dL — ABNORMAL LOW (ref 3.5–5.2)
Alkaline Phosphatase: 62 units/L (ref 39–117)
BUN: 18 mg/dL (ref 6–23)
Basophils Relative: 0.2 % (ref 0.0–3.0)
Calcium: 9.4 mg/dL (ref 8.4–10.5)
Eosinophils Relative: 4.7 % (ref 0.0–5.0)
GFR calc non Af Amer: 88.43 mL/min (ref >60.00)
Glucose, Bld: 179 mg/dL — ABNORMAL HIGH (ref 70–99)
HCT: 38.3 % (ref 36.0–46.0)
Hemoglobin: 13.2 g/dL (ref 12.0–15.0)
Lymphs Abs: 0.6 10*3/uL — ABNORMAL LOW (ref 0.7–4.0)
Monocytes Relative: 10 % (ref 3.0–12.0)
Neutro Abs: 1.6 10*3/uL (ref 1.4–7.7)
Sodium: 139 meq/L (ref 135–145)
WBC: 2.6 10*3/uL — ABNORMAL LOW (ref 4.5–10.5)

## 2010-09-16 ENCOUNTER — Ambulatory Visit: Payer: Self-pay | Admitting: Internal Medicine

## 2010-10-26 ENCOUNTER — Encounter: Payer: Self-pay | Admitting: Gynecology

## 2010-10-26 ENCOUNTER — Encounter: Payer: Self-pay | Admitting: Endocrinology

## 2010-10-26 ENCOUNTER — Encounter: Payer: Self-pay | Admitting: Internal Medicine

## 2010-10-27 ENCOUNTER — Telehealth: Payer: Self-pay | Admitting: Internal Medicine

## 2010-11-04 NOTE — Assessment & Plan Note (Signed)
Summary: 1 mo rov /nws  RS'D PER PT/NWS   Vital Signs:  Patient profile:   65 year old female Height:      63 inches Weight:      211 pounds BMI:     37.51 O2 Sat:      92 % on Room air Temp:     98.9 degrees F oral Pulse rate:   89 / minute Pulse rhythm:   regular Resp:     16 per minute BP sitting:   110 / 80  (left arm) Cuff size:   large  Vitals Entered By: Lanier Prude, CMA(AAMA) (April 28, 2010 3:43 PM)  O2 Flow:  Room air CC: 1 mo f/u.  c/o bilat knee pain/dizziness/HA X 3 days Is Patient Diabetic? Yes Comments pt is not takingomeprazole or Soothe XP   Primary Care Provider:  Trinna Post Christe Tellez,MD  CC:  1 mo f/u.  c/o bilat knee pain/dizziness/HA X 3 days.  History of Present Illness: The patient presents for a follow up of hypertension, diabetes, knee OA C/o HA today  Current Medications (verified): 1)  Spironolactone 100 Mg  Tabs (Spironolactone) .Marland Kitchen.. 1 By Mouth Qd 2)  Vitamin D3 1000 Unit  Tabs (Cholecalciferol) .Marland Kitchen.. 1 Qd 3)  Omeprazole 20 Mg Cpdr (Omeprazole) .... One By Mouth Daily 4)  Freestyle Lite   Strp (Glucose Blood) .... Use Bid 5)  Freestyle Lancets   Misc (Lancets) .... Two Times A Day Prn 6)  Lactulose 10 Gm/38ml Soln (Lactulose) .... 30 Cc By Mouth 2-3 Times A Week 7)  Budeprion Sr 150 Mg Tb12 (Bupropion Hcl) .Marland Kitchen.. 1 By Mouth Q Am 8)  Citalopram Hydrobromide 10 Mg  Tabs (Citalopram Hydrobromide) .... Take 1 Tab By Mouth Daily 9)  Furosemide 40 Mg Tabs (Furosemide) .... Take One Tablet Daily As Needed Bad Swelling X 1-3 D 10)  Ulticare Short Pen Needles 31g X 8 Mm Misc (Insulin Pen Needle) .... Use As Directed 11)  Humalog Pen 100 Unit/ml Soln (Insulin Lispro (Human)) .... 7 To 14 Units Qid 12)  Bd Pen Needle Mini U/f 31g X 5 Mm Misc (Insulin Pen Needle) .... Use As Directed 13)  Gabapentin 100 Mg Caps (Gabapentin) .Marland Kitchen.. 1 By Mouth Three Times A Day Prn 14)  Gabapentin 300 Mg Caps (Gabapentin) .Marland Kitchen.. 1 By Mouth Three Times A Day As Needed Pain 15)   Soothe Xp  Soln (Artificial Tear Solution) .Marland Kitchen.. 1-2 Gtt Each Eye Three Times A Day Prn 16)  Pennsaid 1.5 % Soln (Diclofenac Sodium) .... 3-5 Gtt On Skin Three Times A Day For Pain 17)  Triamcinolone Acetonide 0.5 % Crea (Triamcinolone Acetonide) .... Use Two Times A Day Prn 18)  Accu-Chek Compact Test Drum  Strp (Glucose Blood) .... Check Blood Sugar Two Times A Day  Allergies (verified): 1)  ! 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) CONSTIPATION (ICD-564.00) BOWEL OBSTRUCTION (ICD-560.9) WELL ADULT EXAM (ICD-V70.0) ROSACEA (ICD-695.3) CIRRHOSIS (ICD-571.5) DIABETES MELLITUS, TYPE II (ICD-250.00) HYPERTENSION (ICD-401.9) SUBARACHNOID HEMORRHAGE (ICD-430) DEPRESSION (ICD-311) ANEMIA-IRON 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) LOW BACK PAIN (ICD-724.2) HEARING LOSS (ICD-389.9) UTI'S, CHRONIC (ICD-599.0) MORBID OBESITY (ICD-278.01) PANCREATITIS, HX OF (ICD-V12.70) NEPHROLITHIASIS, HX OF (ICD-V13.01) FAMILY HISTORY DIABETES 1ST DEGREE RELATIVE (ICD-V18.0)  Past Surgical History: Cholecystectomy hysterectomy partial colectomy breast  cyst removal  repair of right tibia-fibula fracture      MRI (L) SPINE (05/1999) LIVER BIOSPY SUCIDIAL ATTEMPT (04/2000) EGD (05/07/2005)  Review of Systems       The patient complains of difficulty walking and depression.  The patient denies fever and dyspnea on exertion.    Physical Exam  General:  NAD overweight-appearing.   Nose:  External nasal examination shows no deformity or inflammation. Nasal mucosa are pink and moist without lesions or exudates. Mouth:  Not dry Lungs:  Clear throughout to auscultation. Heart:  Regular  rate and rhythm; no murmurs, rubs or bruits. Abdomen:   obese abdomen with normal active bowel sounds and multiple surgical scars in right upper quadrant and in midline. There was some  tenderness in RUQ. There was nol tenderness in the lower abdomen,  no mass or fullness. Msk:  L distal anterolat shin is tender with purple erythema area 11x20 cm - warm and tender R knee is tender w/ROM Neurologic:  Alert and  oriented x3;  grossly normal neurologically. Skin:  as above Psych:  Alert and cooperative. not suicidal and subdued.     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   Problem # 2:  HYPERGLYCEMIA (ICD-790.29) Assessment: Deteriorated Risks of noncompliance with diet/treatment discussed. Compliance encouraged.  Her updated medication list for this problem includes:    Humalog Pen 100 Unit/ml Soln (Insulin lispro (human)) .Marland KitchenMarland KitchenMarland KitchenMarland Kitchen 7  to 14 units qid  Labs Reviewed: Creat: 0.8 (04/22/2010)     Problem # 3:  KNEE PAIN (ICD-719.46) B; L>>R Assessment: Unchanged  Problem # 4:  DEPRESSION (ICD-311) Assessment: Unchanged  Her updated medication list for this problem includes:    Budeprion Sr 150 Mg Tb12 (Bupropion hcl) .Marland Kitchen... 1 by mouth q am    Citalopram Hydrobromide 10 Mg Tabs (Citalopram hydrobromide) .Marland Kitchen... Take 1 tab by mouth daily  Problem # 5:  DIABETES MELLITUS, TYPE II (ICD-250.00) Assessment: Deteriorated Risks of noncompliance with treatment discussed. Compliance encouraged.  Her updated medication list for this problem includes:    Humalog Pen 100 Unit/ml Soln (Insulin lispro (human)) .Marland Kitchen... 9 to 16 units qid  Labs Reviewed: Creat: 0.8 (04/22/2010)    Reviewed HgBA1c results: 9.2 (02/20/2010)  9.6 (12/03/2009)  Complete Medication List: 1)  Spironolactone 100 Mg Tabs (Spironolactone) .Marland Kitchen.. 1 by mouth qd 2)  Vitamin D3 1000 Unit Tabs (Cholecalciferol) .Marland Kitchen.. 1 qd 3)  Omeprazole  20 Mg Cpdr (Omeprazole) .... One by mouth daily 4)  Freestyle Lite Strp (Glucose blood) .... Use bid 5)  Freestyle Lancets Misc (Lancets) .... Two times a day prn 6)  Lactulose 10 Gm/56ml Soln (Lactulose) .... 30 cc by mouth 2-3 times a week 7)  Budeprion Sr 150 Mg Tb12 (Bupropion hcl) .Marland Kitchen.. 1 by mouth q am 8)  Citalopram Hydrobromide 10 Mg Tabs (Citalopram hydrobromide) .... Take 1 tab by mouth daily 9)  Furosemide 40 Mg Tabs (Furosemide) .... Take one tablet daily as needed bad swelling x 1-3 d 10)  Ulticare Short Pen Needles 31g X 8 Mm Misc (Insulin pen needle) .... Use as directed 11)  Humalog Pen 100 Unit/ml Soln (Insulin lispro (human)) .... 9 to 16 units qid 12)  Bd Pen Needle Mini U/f 31g X 5 Mm Misc (Insulin pen needle) .... Use as directed 13)  Gabapentin 100 Mg Caps (Gabapentin) .Marland Kitchen.. 1 by mouth three times a day prn 14)  Gabapentin 300 Mg Caps (Gabapentin) .Marland KitchenMarland KitchenMarland Kitchen  1 by mouth three times a day as needed pain 15)  Soothe Xp Soln (Artificial tear solution) .Marland Kitchen.. 1-2 gtt each eye three times a day prn 16)  Pennsaid 1.5 % Soln (Diclofenac sodium) .... 3-5 gtt on skin three times a day for pain 17)  Triamcinolone Acetonide 0.5 % Crea (Triamcinolone acetonide) .... Use two times a day prn 18)  Accu-chek Compact Test Drum Strp (Glucose blood) .... Check blood sugar two times a day  Patient Instructions: 1)  Please schedule a follow-up appointment in 1 month. 2)  BMP prior to visit, ICD-9: 995.20 3)  CBC w/ Diff prior to visit, ICD-9: 4)  Hepatic Panel prior to visit, ICD-9: 5)  INR

## 2010-11-04 NOTE — Progress Notes (Signed)
Summary: Rf Glimeperide  Phone Note Refill Request   Refills Requested: Medication #1:  Glimeperide 4mg  1 bid(not on active med list)   Supply Requested: 60  Method Requested: Electronic Initial call taken by: Lanier Prude, [facility]),  June 10, 2010 3:08 PM  Follow-up for Phone Call        Has she been taking it lately? Follow-up by: Tresa Garter MD,  June 10, 2010 4:44 PM  Additional Follow-up for Phone Call Additional follow up Details #1::        called pt/No answer. Additional Follow-up by: Lanier Prude, [facility]),  June 11, 2010 8:34 AM    Additional Follow-up for Phone Call Additional follow up Details #2::    Attempted to reach pt, no answer, no voicemail. Nicki Guadalajara Fergerson CMA [facility])  June 13, 2010 4:32 PM   called pt/no answer/no voicemail.........Marland Kitchenwill mail contact letter Follow-up by: Lanier Prude, [facility]),  June 18, 2010 2:21 PM   Appended Document: Rf Glimeperide pt in office today...she states she does take glimeperide 4mg  tabs.  Med added to active med list

## 2010-11-04 NOTE — Progress Notes (Signed)
Summary: CT results  Phone Note Other Incoming   Summary of Call: I spoke with [place] and patient studies are normal. I told them that the patient can go home and our office will call her there. Please advise. Initial call taken by: Lucious Groves CMA,  June 03, 2010 5:02 PM  Follow-up for Phone Call        agree Thx Follow-up by: Tresa Garter MD,  June 03, 2010 5:50 PM  Additional Follow-up for Phone Call Additional follow up Details #1::        Pt informed that CT was normal.  Additional Follow-up by: Lamar Sprinkles, CMA,  June 03, 2010 5:56 PM

## 2010-11-04 NOTE — Assessment & Plan Note (Signed)
Summary: blood in urine/offered same day w/another MD - refused/cd   Vital Signs:  Patient profile:   65 year old female Height:      63 inches Weight:      218 pounds BMI:     38.76 Temp:     98.1 degrees F oral Pulse rate:   72 / minute Pulse rhythm:   regular Resp:     16 per minute BP sitting:   122 / 78  (left arm) Cuff size:   large  Vitals Entered By: Lanier Prude, Beverly Gust) (August 15, 2010 4:26 PM) CC: dysuria, hematuria, Rt side abd pain Is Patient Diabetic? Yes Comments pt is not taking Tessalon pearles or prometha-codeine   Primary Care Provider:  Trinna Post Plotnikov,MD  CC:  dysuria, hematuria, and Rt side abd pain.  History of Present Illness: C/o pain in R abd and bloody urine x 4 wks - not better C/o callus on R foot  Current Medications (verified): 1)  Torsemide 20 Mg Tabs (Torsemide) .... 2 By Mouth Once Daily For Swelling 2)  Spironolactone 100 Mg  Tabs (Spironolactone) .Marland Kitchen.. 1 By Mouth Qd 3)  Lactulose 10 Gm/71ml Soln (Lactulose) .... 30 Cc By Mouth 2-3 Times A Week 4)  Budeprion Sr 150 Mg Tb12 (Bupropion Hcl) .Marland Kitchen.. 1 By Mouth Q Am 5)  Citalopram Hydrobromide 10 Mg  Tabs (Citalopram Hydrobromide) .... Take 1 Tab By Mouth Daily 6)  Ulticare Short Pen Needles 31g X 8 Mm Misc (Insulin Pen Needle) .... Use As Directed 7)  Humalog Pen 100 Unit/ml Soln (Insulin Lispro (Human)) .... 9 To 16 Units Qid 8)  Bd Pen Needle Mini U/f 31g X 5 Mm Misc (Insulin Pen Needle) .... Use As Directed 9)  Pennsaid 1.5 % Soln (Diclofenac Sodium) .... 3-5 Gtt On Skin Three Times A Day For Pain 10)  Promethazine-Codeine 6.25-10 Mg/13ml Syrp (Promethazine-Codeine) .... 5-10 Ml By Mouth Q Id As Needed Cough 11)  Tessalon Perles 100 Mg Caps (Benzonatate) .Marland Kitchen.. 1-2 By Mouth Two Times A Day As Needed Cogh 12)  Align  Caps (Probiotic Product) .Marland Kitchen.. 1 By Mouth Once Daily For Your Intesinal Flora Restoraion 13)  Freestyle Lite   Strp (Glucose Blood) .... Use Bid 14)  Vitamin D3 1000 Unit  Tabs  (Cholecalciferol) .Marland Kitchen.. 1 Qd 15)  Freestyle Lancets   Misc (Lancets) .... Two Times A Day Prn 16)  Accu-Chek Compact Test Drum  Strp (Glucose Blood) .... Check Blood Sugar Two Times A Day 17)  Gabapentin 400 Mg Caps (Gabapentin) .Marland Kitchen.. 1 By Mouth Three Times A Day As Needed Pain  Allergies (verified): 1)  ! 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) CONSTIPATION (ICD-564.00) BOWEL OBSTRUCTION (ICD-560.9) WELL ADULT EXAM (ICD-V70.0) ROSACEA (ICD-695.3) CIRRHOSIS (ICD-571.5) DIABETES MELLITUS, TYPE II (ICD-250.00) HYPERTENSION (ICD-401.9) SUBARACHNOID HEMORRHAGE (ICD-430) DEPRESSION (ICD-311) ANEMIA-IRON 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) LOW BACK PAIN (ICD-724.2) HEARING LOSS (ICD-389.9) UTI'S, CHRONIC (ICD-599.0) MORBID OBESITY (ICD-278.01) PANCREATITIS, HX OF (ICD-V12.70) NEPHROLITHIASIS, HX OF (ICD-V13.01) FAMILY HISTORY DIABETES 1ST DEGREE RELATIVE (ICD-V18.0)  Social History: Last updated: 04/02/2009 Occupation: disabled Married - may be separating 2009      still  married Former Smoker Illicit Drug Use - no 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  The patient denies anorexia, fever, and melena.    Physical Exam  General:  NAD overweight-appearing.   Nose:  External nasal examination shows no deformity or inflammation. R nostril w/dry blood Mouth:  Erythematous throat and intranasal mucosa c/w URI  Neck:  No deformities, masses, or tenderness noted. Lungs:  Clear throughout to auscultation. Heart:  Regular rate and rhythm; no murmurs, rubs or bruits. Abdomen:   obese abdomen  with normal active bowel sounds and multiple surgical scars in right upper quadrant and in midline. There was no  tenderness in RUQ. There was nol tenderness in the lower abdomen,  no mass or fullness. Msk:  R knee is not tender w/ROM a 1.5 cm R sole lat callus Extremities:  no edema B Neurologic:  Alert and  oriented x3;  grossly normal neurologically. Skin:  R lat foot callus   Impression & Recommendations:  Problem # 1:  RLQ PAIN (ICD-789.03) r/o kidney stone Assessment Deteriorated  Orders: Radiology Referral (Radiology) The labs were reviewed with the patient.   Problem # 2:  HEMATURIA UNSPECIFIED (ICD-599.70) Assessment: Unchanged  Orders: Radiology Referral (Radiology)  Problem # 3:  CALLUS, FOOT (ICD-700) R Assessment: Deteriorated Debrided w/a blade Tolerated well. Complicatons - none. Good pain relief following the procedure.   Complete Medication List: 1)  Torsemide 20 Mg Tabs (Torsemide) .... 2 by mouth once daily for swelling 2)  Spironolactone 100 Mg Tabs (Spironolactone) .Marland Kitchen.. 1 by mouth qd 3)  Lactulose 10 Gm/44ml Soln (Lactulose) .... 30 cc by mouth 2-3 times a week 4)  Budeprion Sr 150 Mg Tb12 (Bupropion hcl) .Marland Kitchen.. 1 by mouth q am 5)  Citalopram Hydrobromide 10 Mg Tabs (Citalopram hydrobromide) .... Take 1 tab by mouth daily 6)  Ulticare Short Pen Needles 31g X 8 Mm Misc (Insulin pen needle) .... Use as directed 7)  Humalog Pen 100 Unit/ml Soln (Insulin lispro (human)) .... 9 to 16 units qid 8)  Bd Pen Needle Mini U/f 31g X 5 Mm Misc (Insulin pen needle) .... Use as directed 9)  Pennsaid 1.5 % Soln (Diclofenac sodium) .... 3-5 gtt on skin three times a day for pain 10)  Promethazine-codeine 6.25-10 Mg/29ml Syrp (Promethazine-codeine) .... 5-10 ml by mouth q id as needed cough 11)  Tessalon Perles 100 Mg Caps (Benzonatate) .Marland Kitchen.. 1-2 by mouth two times a day as needed cogh 12)  Align Caps (Probiotic product) .Marland Kitchen.. 1 by mouth once daily for your intesinal flora  restoraion 13)  Freestyle Lite Strp (Glucose blood) .... Use bid 14)  Vitamin D3 1000 Unit Tabs (Cholecalciferol) .Marland Kitchen.. 1 qd 15)  Freestyle Lancets Misc (Lancets) .... Two times a day prn 16)  Accu-chek Compact Test Drum Strp (Glucose blood) .... Check blood sugar two times a day 17)  Gabapentin 400 Mg Caps (Gabapentin) .Marland Kitchen.. 1 by mouth three times a day as needed pain  Patient Instructions: 1)  Please schedule a follow-up appointment in 2 weeks. 2)  Call if you are not better in a reasonable amount of time or if worse. Go to ER if feeling really bad!    Orders Added: 1)  Radiology Referral [Radiology] 2)  Est. Patient Level IV [09811]

## 2010-11-04 NOTE — Assessment & Plan Note (Signed)
Summary: 6 WK ROV /NWS   Vital Signs:  Patient profile:   65 year old female Height:      63 inches (160.02 cm) Weight:      215.50 pounds (97.95 kg) BMI:     38.31 O2 Sat:      91 % on Room air Temp:     98.3 degrees F (36.83 degrees C) oral Pulse rate:   85 / minute BP sitting:   100 / 70  (left arm) Cuff size:   large  Vitals Entered By: Lucious Groves CMA (June 03, 2010 10:24 AM)  O2 Flow:  Room air .CC: 6 week rtn ov./kb Is Patient Diabetic? No Pain Assessment Patient in pain? no      Comments Patient states that she is not taking Omeprazole, Soothe XP, Pennsaid, or Triamcinolone. Lucious Groves CMA  June 03, 2010 10:25 AM    Primary Care Provider:  Trinna Post Plotnikov,MD  CC:  6 week rtn ov./kb.  History of Present Illness: C/o HA on L since her fall in June. C/o occasional nose bleeds since then. C/o pain everywhere. C/o coughing up green x 1 wk F/u OA, abn labs, DM  Current Medications (verified): 1)  Spironolactone 100 Mg  Tabs (Spironolactone) .Marland Kitchen.. 1 By Mouth Qd 2)  Vitamin D3 1000 Unit  Tabs (Cholecalciferol) .Marland Kitchen.. 1 Qd 3)  Omeprazole 20 Mg Cpdr (Omeprazole) .... One By Mouth Daily 4)  Freestyle Lite   Strp (Glucose Blood) .... Use Bid 5)  Freestyle Lancets   Misc (Lancets) .... Two Times A Day Prn 6)  Lactulose 10 Gm/4ml Soln (Lactulose) .... 30 Cc By Mouth 2-3 Times A Week 7)  Budeprion Sr 150 Mg Tb12 (Bupropion Hcl) .Marland Kitchen.. 1 By Mouth Q Am 8)  Citalopram Hydrobromide 10 Mg  Tabs (Citalopram Hydrobromide) .... Take 1 Tab By Mouth Daily 9)  Furosemide 40 Mg Tabs (Furosemide) .... Take One Tablet Daily As Needed Bad Swelling X 1-3 D 10)  Ulticare Short Pen Needles 31g X 8 Mm Misc (Insulin Pen Needle) .... Use As Directed 11)  Humalog Pen 100 Unit/ml Soln (Insulin Lispro (Human)) .... 9 To 16 Units Qid 12)  Bd Pen Needle Mini U/f 31g X 5 Mm Misc (Insulin Pen Needle) .... Use As Directed 13)  Gabapentin 100 Mg Caps (Gabapentin) .Marland Kitchen.. 1 By Mouth Three Times A Day  Prn 14)  Gabapentin 300 Mg Caps (Gabapentin) .Marland Kitchen.. 1 By Mouth Three Times A Day As Needed Pain 15)  Soothe Xp  Soln (Artificial Tear Solution) .Marland Kitchen.. 1-2 Gtt Each Eye Three Times A Day Prn 16)  Pennsaid 1.5 % Soln (Diclofenac Sodium) .... 3-5 Gtt On Skin Three Times A Day For Pain 17)  Triamcinolone Acetonide 0.5 % Crea (Triamcinolone Acetonide) .... Use Two Times A Day Prn 18)  Accu-Chek Compact Test Drum  Strp (Glucose Blood) .... Check Blood Sugar Two Times A Day  Allergies (verified): 1)  ! 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) CONSTIPATION (ICD-564.00) BOWEL OBSTRUCTION (ICD-560.9) WELL ADULT EXAM (ICD-V70.0) ROSACEA (ICD-695.3) CIRRHOSIS (ICD-571.5) DIABETES MELLITUS, TYPE II (ICD-250.00) HYPERTENSION (ICD-401.9) SUBARACHNOID HEMORRHAGE (ICD-430) DEPRESSION (ICD-311) ANEMIA-IRON 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) LOW BACK PAIN (ICD-724.2) HEARING LOSS (ICD-389.9) UTI'S, CHRONIC (ICD-599.0) MORBID OBESITY (ICD-278.01) PANCREATITIS, HX OF (ICD-V12.70) NEPHROLITHIASIS, HX  OF (ICD-V13.01) FAMILY HISTORY DIABETES 1ST DEGREE RELATIVE (ICD-V18.0)  Social History: Last updated: 04/02/2009 Occupation: disabled Married - may be separating 2009      still  married Former Smoker Illicit Drug Use - no Got herself a puppy 2010  Review of Systems       The patient complains of headaches, difficulty walking, and depression.  The patient denies fever, syncope, abdominal pain, and melena.         nose bleed  Physical Exam  General:  NAD overweight-appearing.   Head:  R scull around R eye is tender Ears:  2 mm scab in L ear canal Nose:  External nasal examination  shows no deformity or inflammation. R nostril w/dry blood Lungs:  Clear throughout to auscultation. Heart:  Regular rate and rhythm; no murmurs, rubs or bruits. Abdomen:   obese abdomen with normal active bowel sounds and multiple surgical scars in right upper quadrant and in midline. There was some  tenderness in RUQ. There was nol tenderness in the lower abdomen,  no mass or fullness. Msk:  L distal anterolat shin is tender with purple erythema area 11x20 cm - warm and tender R knee is tender w/ROM Neurologic:  Alert and  oriented x3;  grossly normal neurologically. Skin:  small bruises on forearms Psych:  Alert and cooperative. not suicidal and subdued.    Diabetes Management Exam:    Foot Exam (with socks and/or shoes not present):       Sensory-Pinprick/Light touch:          Left medial foot (L-4): normal          Left dorsal foot (L-5): normal          Left lateral foot (S-1): normal          Right medial foot (L-4): normal          Right dorsal foot (L-5): normal          Right lateral foot (S-1): normal       Sensory-Monofilament:          Left foot: normal          Right foot: normal       Inspection:          Left foot: normal          Right foot: normal       Nails:          Left foot: normal          Right foot: normal   Impression & Recommendations:  Problem # 1:  HEADACHE (ICD-784.0) R and R face pain w/a h/o fall Assessment Deteriorated Poss sinusitis Orders: Radiology Referral (Radiology)  Problem # 2:  THROMBOCYTOPENIA (ICD-287.5) Assessment: Unchanged labs is pending   Problem # 3:  KNEE PAIN (ICD-719.46) B Assessment: Unchanged  Problem # 4:  DIABETES MELLITUS, TYPE II (ICD-250.00) Assessment: Unchanged  Her updated medication list for this problem includes:    Humalog Pen 100 Unit/ml Soln (Insulin lispro (human)) .Marland Kitchen... 9 to 16 units qid  Problem # 5:  CIRRHOSIS (ICD-571.5) Assessment: Unchanged  Problem # 6:  EPISTAXIS (ICD-784.7)  R Assessment: New r/o sinusitis  Complete Medication List: 1)  Spironolactone 100 Mg Tabs (Spironolactone) .Marland Kitchen.. 1 by mouth qd 2)  Vitamin D3 1000 Unit Tabs (Cholecalciferol) .Marland Kitchen.. 1 qd 3)  Freestyle Lite Strp (Glucose blood) .... Use bid 4)  Freestyle Lancets Misc (Lancets) .... Two times a day prn 5)  Lactulose 10 Gm/65ml  Soln (Lactulose) .... 30 cc by mouth 2-3 times a week 6)  Budeprion Sr 150 Mg Tb12 (Bupropion hcl) .Marland Kitchen.. 1 by mouth q am 7)  Citalopram Hydrobromide 10 Mg Tabs (Citalopram hydrobromide) .... Take 1 tab by mouth daily 8)  Furosemide 40 Mg Tabs (Furosemide) .... Take one tablet daily as needed bad swelling x 1-3 d 9)  Ulticare Short Pen Needles 31g X 8 Mm Misc (Insulin pen needle) .... Use as directed 10)  Humalog Pen 100 Unit/ml Soln (Insulin lispro (human)) .... 9 to 16 units qid 11)  Bd Pen Needle Mini U/f 31g X 5 Mm Misc (Insulin pen needle) .... Use as directed 12)  Gabapentin 100 Mg Caps (Gabapentin) .Marland Kitchen.. 1 by mouth three times a day prn 13)  Gabapentin 300 Mg Caps (Gabapentin) .Marland Kitchen.. 1 by mouth three times a day as needed pain 14)  Pennsaid 1.5 % Soln (Diclofenac sodium) .... 3-5 gtt on skin three times a day for pain 15)  Accu-chek Compact Test Drum Strp (Glucose blood) .... Check blood sugar two times a day 16)  Ceftin 250 Mg Tabs (Cefuroxime axetil) .Marland Kitchen.. 1 by mouth two times a day  Other Orders: Flu Vaccine 27yrs + ([address]) Administration Flu vaccine - MCR (U0454)  Patient Instructions: 1)  Please schedule a follow-up appointment in 2-4 weeks. 2)  Call if you are not better in a reasonable amount of time or if worse. Go to ER if feeling really bad!  Prescriptions: CEFTIN 250 MG TABS (CEFUROXIME AXETIL) 1 by mouth two times a day  #20 x 0   Entered and Authorized by:   Tresa Garter MD   Signed by:   Tresa Garter MD on 06/03/2010   Method used:   [facility]  [address] [phone]* (retail)       [address]       [place], [place]  [address]       Ph: [phone]       Fax: [phone]   RxID:   [id number]                Flu Vaccine Consent Questions     Do you have a history of severe allergic reactions to this vaccine? no    Any prior history of allergic reactions to egg and/or gelatin? no    Do you have a sensitivity to the preservative Thimersol? no    Do you have a past history of Guillan-Barre Syndrome? no    Do you currently have an acute febrile illness? no    Have you ever had a severe reaction to latex? no    Vaccine information given and explained to patient? yes    Are you currently pregnant? no    Lot Number:[id number]   Exp Date:04/04/2011   Site Given  Left Deltoid IMlu

## 2010-11-04 NOTE — Letter (Signed)
Summary: Primary Care Appointment Letter  [facility]  [address] [place], [place] [address]   Phone: [phone]  Fax: [phone]    03/05/2010 MRN: [id number]  [name] [address] [place], [place]  [address]  Dear [name],   Your Primary Care Physician Tresa Garter MD has indicated that:    _______it is time to schedule an appointment.    _______you missed your appointment on______ and need to call and          reschedule.    _______you need to have lab work done.    _______you need to schedule an appointment discuss lab or test results.    __X_____you need to call to reschedule your appointment that is                       scheduled on 04/01/10 @ 130P.     Please call our office as soon as possible. Our phone number is [phone]. Please press option 1. Our office is open 8a-12noon and 1p-5p, Monday through Friday.     Thank you,     Primary Care Scheduler

## 2010-11-04 NOTE — Progress Notes (Signed)
Summary: CRITICAL LAB  Phone Note From Other Clinic   Summary of Call: Critical LAB: Platelet 30,000 Initial call taken by: Lamar Sprinkles, CMA,  December 03, 2009 12:18 PM  Follow-up for Phone Call        noted - not new. thx Follow-up by: Tresa Garter MD,  December 03, 2009 1:28 PM

## 2010-11-04 NOTE — Assessment & Plan Note (Signed)
Summary: KNEE PAIN/ WANTS A CORTISONE SHOT/NWS   Vital Signs:  Patient profile:   65 year old female Height:      63 inches Weight:      213.25 pounds BMI:     37.91 O2 Sat:      94 % on Room air Temp:     98.2 degrees F oral Pulse rate:   85 / minute BP sitting:   134 / 100  (left arm) Cuff size:   large  Vitals Entered By: Lucious Groves CMA (January 03, 2010 1:51 PM)  O2 Flow:  Room air CC: Est pt--knee pain x 2 yrs./kb Is Patient Diabetic? Yes Did you bring your meter with you today? No Pain Assessment Patient in pain? yes     Location: knee Intensity: 9 Type: sharp Onset of pain  2 yrs ago/daily   Primary Care Provider:  Trinna Post Shandell Giovanni,MD  CC:  Est pt--knee pain x 2 yrs./kb.  History of Present Illness: C/o L knee pain - worse  Current Medications (verified): 1)  Spironolactone 100 Mg  Tabs (Spironolactone) .Marland Kitchen.. 1 By Mouth Qd 2)  Vitamin D3 1000 Unit  Tabs (Cholecalciferol) .Marland Kitchen.. 1 Qd 3)  Omeprazole 20 Mg Cpdr (Omeprazole) .... One By Mouth Daily 4)  Freestyle Lite   Strp (Glucose Blood) .... Use Bid 5)  Freestyle Lancets   Misc (Lancets) .... Two Times A Day Prn 6)  Lactulose 10 Gm/57ml Soln (Lactulose) .... 30 Cc By Mouth 2-3 Times A Week 7)  Budeprion Sr 150 Mg Tb12 (Bupropion Hcl) .Marland Kitchen.. 1 By Mouth Q Am 8)  Citalopram Hydrobromide 10 Mg  Tabs (Citalopram Hydrobromide) .... Take 1 Tab By Mouth Daily 9)  Furosemide 40 Mg Tabs (Furosemide) .... Take One Tablet Daily As Needed Bad Swelling X 1-3 D 10)  Ulticare Short Pen Needles 31g X 8 Mm Misc (Insulin Pen Needle) .... Use As Directed 11)  Humalog Pen 100 Unit/ml Soln (Insulin Lispro (Human)) .... 7 To 14 Units Qid 12)  Bd Pen Needle Mini U/f 31g X 5 Mm Misc (Insulin Pen Needle) .... Use As Directed 13)  Gabapentin 100 Mg Caps (Gabapentin) .Marland Kitchen.. 1 By Mouth Three Times A Day Prn 14)  Gabapentin 300 Mg Caps (Gabapentin) .Marland Kitchen.. 1 By Mouth Three Times A Day As Needed Pain 15)  Soothe Xp  Soln (Artificial Tear Solution)  .Marland Kitchen.. 1-2 Gtt Each Eye Three Times A Day Prn  Allergies (verified): 1)  ! Tetracycline Hcl (Tetracycline Hcl) 2)  Metformin Hcl 3)  Zithromax 4)  Cipro 5)  Augmentin (Amoxicillin-Pot Clavulanate)  Physical Exam  General:  NAD overweight-appearing.   Nose:  External nasal examination shows no deformity or inflammation. Nasal mucosa are pink and moist without lesions or exudates. Lungs:  Clear throughout to auscultation. Heart:  Regular rate and rhythm; no murmurs, rubs or bruits. Abdomen:   obese abdomen with normal active bowel sounds and multiple surgical scars in right upper quadrant and in midline. There was some  tenderness in RUQ. There was nol tenderness in the lower abdomen,  no mass or fullness. Msk:  L knee is tender anteriorly Extremities:  No edema B Neurologic:  Alert and  oriented x3;  grossly normal neurologically. Skin:  No rash Psych:  Alert and cooperative. not suicidal and subdued.     Impression & Recommendations:  Problem # 1:  KNEE PAIN (ICD-719.46) L, OA Assessment Deteriorated I'm not willing to inject due to low PLT count Pennsaid  Problem # 2:  CIRRHOSIS (ICD-571.5) Assessment: Unchanged The labs were reviewed with the patient.   Problem # 3:  THROMBOCYTOPENIA (ICD-287.5) Assessment: Unchanged See #1  Problem # 4:  DIABETES MELLITUS, TYPE II (ICD-250.00) Assessment: Unchanged  Her updated medication list for this problem includes:    Humalog Pen 100 Unit/ml Soln (Insulin lispro (human)) .Marland KitchenMarland KitchenMarland KitchenMarland Kitchen 7 to 14 units qid  Labs Reviewed: Creat: 0.9 (12/03/2009)    Reviewed HgBA1c results: 9.6 (12/03/2009)  8.9 (10/11/2009)  Complete Medication List: 1)  Spironolactone 100 Mg Tabs (Spironolactone) .Marland Kitchen.. 1 by mouth qd 2)  Vitamin D3 1000 Unit Tabs (Cholecalciferol) .Marland Kitchen.. 1 qd 3)  Omeprazole 20 Mg Cpdr (Omeprazole) .... One by mouth daily 4)  Freestyle Lite Strp (Glucose blood) .... Use bid 5)  Freestyle Lancets Misc (Lancets) .... Two times a day prn 6)   Lactulose 10 Gm/42ml Soln (Lactulose) .... 30 cc by mouth 2-3 times a week 7)  Budeprion Sr 150 Mg Tb12 (Bupropion hcl) .Marland Kitchen.. 1 by mouth q am 8)  Citalopram Hydrobromide 10 Mg Tabs (Citalopram hydrobromide) .... Take 1 tab by mouth daily 9)  Furosemide 40 Mg Tabs (Furosemide) .... Take one tablet daily as needed bad swelling x 1-3 d 10)  Ulticare Short Pen Needles 31g X 8 Mm Misc (Insulin pen needle) .... Use as directed 11)  Humalog Pen 100 Unit/ml Soln (Insulin lispro (human)) .... 7 to 14 units qid 12)  Bd Pen Needle Mini U/f 31g X 5 Mm Misc (Insulin pen needle) .... Use as directed 13)  Gabapentin 100 Mg Caps (Gabapentin) .Marland Kitchen.. 1 by mouth three times a day prn 14)  Gabapentin 300 Mg Caps (Gabapentin) .Marland Kitchen.. 1 by mouth three times a day as needed pain 15)  Soothe Xp Soln (Artificial tear solution) .Marland Kitchen.. 1-2 gtt each eye three times a day prn 16)  Pennsaid 1.5 % Soln (Diclofenac sodium) .... 3-5 gtt on skin three times a day for pain  Patient Instructions: 1)  Please schedule a follow-up appointment in 2 weeks. Prescriptions: PENNSAID 1.5 % SOLN (DICLOFENAC SODIUM) 3-5 gtt on skin three times a day for pain  #1 x 3   Entered and Authorized by:   Tresa Garter MD   Signed by:   Tresa Garter MD on 01/03/2010   Method used:   Print then Give to Patient   RxID:   [id number]

## 2010-11-04 NOTE — Progress Notes (Signed)
Summary: Critical Lab  Phone Note Other Incoming   Caller: [facility] Summary of Call: pt's Plt count is 37,000.  PCP informed.  Do you want spceimen sent to outside lab for verification? Initial call taken by: Lanier Prude, [facility]),  April 22, 2010 10:48 AM  Follow-up for Phone Call        No Thanks - noted. Follow-up by: Tresa Garter MD,  April 22, 2010 1:17 PM  Additional Follow-up for Phone Call Additional follow up Details #1::        lab informed Additional Follow-up by: Lamar Sprinkles, CMA,  April 22, 2010 2:32 PM

## 2010-11-04 NOTE — Progress Notes (Signed)
  Phone Note Call from Patient   Summary of Call: pt called stating she is in pain all over.......including Rt side chest pain.  Pt offered 4:15 same day appt.  She does not have transportation.  Pt advised to go to ER as soon as transportation is available. Pt understands. Initial call taken by: Lanier Prude, [facility]),  July 04, 2010 2:07 PM  Follow-up for Phone Call        agree Thx Follow-up by: Tresa Garter MD,  July 04, 2010 4:55 PM

## 2010-11-04 NOTE — Miscellaneous (Signed)
Summary: RT Knee Injection/[facility]  RT Knee Injection/[employer] [facility]   Imported By: Sherian Rein 03/25/2010 10:48:37  _____________________________________________________________________  External Attachment:    Type:   Image     Comment:   External Document

## 2010-11-04 NOTE — Progress Notes (Signed)
Summary: Xray results  Phone Note Call from Patient   Caller: Patient Summary of Call:  called requesting results of Chest xray. Please let her know after Jeyren Danowski reviews   Initial call taken by: Lanier Prude, [facility]),  June 26, 2010 8:08 AM  Follow-up for Phone Call        CXR was OK Follow-up by: Tresa Garter MD,  June 26, 2010 11:49 AM  Additional Follow-up for Phone Call Additional follow up Details #1::        No answer.  Will retry later Additional Follow-up by: Lanier Prude, [facility]),  June 26, 2010 1:33 PM    Additional Follow-up for Phone Call Additional follow up Details #2::    pt informed  Follow-up by: Lanier Prude, [facility]),  June 26, 2010 1:52 PM

## 2010-11-04 NOTE — Progress Notes (Signed)
Summary: Med ?  Phone Note From Pharmacy   Summary of Call: [facility] Prom[facility]Code[facility].  Did you give pt hand written on 06-24-10?? ok to RF? Initial call taken by: Lanier Prude, [employer]),  June 30, 2010 1:30 PM  Follow-up for Phone Call        ok to ref Follow-up by: Tresa Garter MD,  June 30, 2010 5:17 PM    Prescriptions: PROMETHAZINE-CODEINE 6.25-10 MG/5ML SYRP (PROMETHAZINE-CODEINE) 5-10 ml by mouth q id as needed cough  #300 ml x 0   Entered by:   Lamar Sprinkles, CMA   Authorized by:   Tresa Garter MD   Signed by:   Lamar Sprinkles, CMA on 06/30/2010   Method used:   [facility] ...       [facility]  [address] [id number]       [address]       [place], [place]  [address]       Ph: [phone]       Fax: [phone]   RxID:   [id number]

## 2010-11-04 NOTE — Assessment & Plan Note (Signed)
Summary: f/u appt/cd   Vital Signs:  Patient profile:   65 year old female Weight:      208 pounds BMI:     36.98 Temp:     98.3 degrees F oral Pulse rate:   80 / minute BP sitting:   120 / 82  (left arm)  Vitals Entered By: Tora Perches (October 29, 2009 9:46 AM) CC: f/u Is Patient Diabetic? No   Primary Care Provider:  Trinna Post Skylynn Burkley,MD  CC:  f/u.  History of Present Illness: The patient presents for a follow up of hypertension, diabetes, hyperlipidemia C/o aches all over C/o L earache x 1 wk  Preventive Screening-Counseling & Management  Alcohol-Tobacco     Smoking Status: quit  Current Medications (verified): 1)  Spironolactone 100 Mg  Tabs (Spironolactone) .Marland Kitchen.. 1 By Mouth Qd 2)  Vitamin D3 1000 Unit  Tabs (Cholecalciferol) .Marland Kitchen.. 1 Qd 3)  Januvia 50 Mg  Tabs (Sitagliptin Phosphate) .Marland Kitchen.. 1 By Mouth Qd 4)  Omeprazole 20 Mg Cpdr (Omeprazole) .... One By Mouth Daily 5)  Freestyle Lite   Strp (Glucose Blood) .... Use Bid 6)  Freestyle Lancets   Misc (Lancets) .... Two Times A Day Prn 7)  Lactulose 10 Gm/32ml Soln (Lactulose) .... 30 Cc By Mouth 2-3 Times A Week 8)  Budeprion Sr 150 Mg Tb12 (Bupropion Hcl) .Marland Kitchen.. 1 By Mouth Q Am 9)  Citalopram Hydrobromide 10 Mg  Tabs (Citalopram Hydrobromide) .... Take 1 Tab By Mouth Daily 10)  Furosemide 40 Mg Tabs (Furosemide) .... Take One Tablet Daily As Needed Bad Swelling X 1-3 D 11)  Ulticare Short Pen Needles 31g X 8 Mm Misc (Insulin Pen Needle) .... Use As Directed 12)  Humalog Pen 100 Unit/ml Soln (Insulin Lispro (Human)) .... 5 T0 12 Units Once Daily Depending On Cbg 13)  Bd Pen Needle Mini U/f 31g X 5 Mm Misc (Insulin Pen Needle) .... Use As Directed 14)  Gabapentin 100 Mg Caps (Gabapentin) .Marland Kitchen.. 1 By Mouth Three Times A Day Prn  Allergies: 1)  ! 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) CONSTIPATION (ICD-564.00) BOWEL OBSTRUCTION (ICD-560.9) WELL ADULT EXAM (ICD-V70.0) ROSACEA (ICD-695.3) CIRRHOSIS (ICD-571.5) DIABETES MELLITUS, TYPE II (ICD-250.00) HYPERTENSION (ICD-401.9) SUBARACHNOID HEMORRHAGE (ICD-430) DEPRESSION (ICD-311) ANEMIA-IRON 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) LOW BACK PAIN (ICD-724.2) HEARING LOSS (ICD-389.9) UTI'S, CHRONIC (ICD-599.0) MORBID OBESITY (ICD-278.01) PANCREATITIS, HX OF (ICD-V12.70) NEPHROLITHIASIS, HX OF (ICD-V13.01) FAMILY HISTORY DIABETES 1ST DEGREE RELATIVE (ICD-V18.0)  Past Surgical History: Last updated: 11/07/2008 Cholecystectomy hysterectomy partial colectomy breast cyst removal  repair of right tibia-fibula     MRI (L) SPINE (05/1999) LIVER BIOSPY SUCIDIAL ATTEMPT (04/2000) EGD (05/07/2005)  Social History: Last updated: 04/02/2009 Occupation: disabled Married - may be separating 2009      still  married Former Smoker Illicit Drug Use - no Got herself a puppy 2010  Family History: Reviewed history from 11/07/2008 and no changes required. Family History Diabetes 1st degree relative Family History of Clotting disorder:  Family History of Colitis/Crohn's: Family History of Diabetes:  Family History of Irritable Bowel Syndrome: Family History of Heart Disease: Sister  Social History: Reviewed history from 04/02/2009 and no changes required. Occupation: disabled Married - may be separating 2009      still  married Former Smoker  Illicit Drug Use - no Got herself a puppy 2010  Physical Exam  General:  NAD overweight-appearing.   Ears:  2 mm scab in L ear canal Nose:  External nasal examination shows no deformity or inflammation. Nasal mucosa are pink and moist without lesions or exudates. Mouth:  Not dry Lungs:  Clear throughout  to auscultation. Heart:  Regular rate and rhythm; no murmurs, rubs or bruits. Abdomen:   obese abdomen with normal active bowel sounds and multiple surgical scars in right upper quadrant and in midline. There was some  tenderness in RUQ. There was nol tenderness in the lower abdomen,  no mass or fullness. Msk:  Lumbar-sacral spine is tender to palpation over paraspinal muscles and painfull with the ROM    Extremities:  No edema B Neurologic:  Alert and  oriented x3;  grossly normal neurologically. Skin:  No rash Psych:  Alert and cooperative. not suicidal and subdued.     Impression & Recommendations:  Problem # 1:  CIRRHOSIS (ICD-571.5) Assessment Unchanged On prescription therapy The labs were reviewed with the patient.   Problem # 2:  DIABETES MELLITUS, TYPE II (ICD-250.00) Assessment: Deteriorated  Her updated medication list for this problem includes:    Januvia 50 Mg Tabs (Sitagliptin phosphate) .Marland Kitchen... 1 by mouth qd    Humalog Pen 100 Unit/ml Soln (Insulin lispro (human)) .Marland KitchenMarland KitchenMarland KitchenMarland Kitchen 5 to 12 units once daily depending on cbg  Problem # 3:  DEPRESSION (ICD-311) Assessment: Unchanged  Her updated medication list for this problem includes:    Budeprion Sr 150 Mg Tb12 (Bupropion hcl) .Marland Kitchen... 1 by mouth q am    Citalopram Hydrobromide 10 Mg Tabs (Citalopram hydrobromide) .Marland Kitchen... 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 1 by mouth qd 2)  Vitamin D3 1000 Unit Tabs (Cholecalciferol) .Marland Kitchen.. 1 qd 3)  Januvia 50 Mg Tabs (Sitagliptin phosphate) .Marland Kitchen.. 1 by mouth qd 4)  Omeprazole 20 Mg Cpdr (Omeprazole) .... One by mouth daily 5)  Freestyle Lite Strp (Glucose blood) .... Use bid 6)  Freestyle Lancets Misc (Lancets) ....  Two times a day prn 7)  Lactulose 10 Gm/90ml Soln (Lactulose) .... 30 cc by mouth 2-3 times a week 8)  Budeprion Sr 150 Mg Tb12 (Bupropion hcl) .Marland Kitchen.. 1 by mouth q am 9)  Citalopram Hydrobromide 10 Mg Tabs (Citalopram hydrobromide) .... Take 1 tab by mouth daily 10)  Furosemide 40 Mg Tabs (Furosemide) .... Take one tablet daily as needed bad swelling x 1-3 d 11)  Ulticare Short Pen Needles 31g X 8 Mm Misc (Insulin pen needle) .... Use as directed 12)  Humalog Pen 100 Unit/ml Soln (Insulin lispro (human)) .... 7 to 14 units once daily depending on cbg 13)  Bd Pen Needle Mini U/f 31g X 5 Mm Misc (Insulin pen needle) .... Use as directed 14)  Gabapentin 100 Mg Caps (Gabapentin) .Marland Kitchen.. 1 by mouth three times a day prn 15)  Gabapentin 300 Mg Caps (Gabapentin) .Marland Kitchen.. 1 by mouth three times a day as needed pain 16)  Soothe Xp Soln (Artificial tear solution) .Marland Kitchen.. 1-2 gtt each eye three times a day prn  Patient Instructions: 1)  Please schedule a follow-up appointment in 3 months. 2)  BMP prior to visit,  ICD-9: 3)  HbgA1C prior to visit, ICD-9: 4)  Vit B12 782.0 5)  CBC 6)  INR 7)  Hepatic Panel prior to visit, ICD-9:250.00 8)  HbgA1C prior to visit, ICD-9: Prescriptions: LACTULOSE 10 GM/15ML SOLN (LACTULOSE) 30 cc by mouth 2-3 times a week  #1 x 12   Entered and Authorized by:   Tresa Garter MD   Signed by:   Tresa Garter MD on 10/29/2009   Method used:   Electronically to        [facility]  [address] [phone]* (retail)       [address]       [place], [place]  [address]       Ph: [phone]       Fax: [phone]   RxID:   [id number] SOOTHE XP  SOLN (ARTIFICIAL TEAR SOLUTION) 1-2 gtt each eye three times a day prn  #1 x 12   Entered and Authorized by:   Tresa Garter MD   Signed by:   Tresa Garter MD on 10/29/2009   Method used:   Electronically to        [facility]  [address] [id number] (retail)       [address]       [place], [place]  [address]       Ph: [phone]       Fax: [phone]   RxID:   [id number] GABAPENTIN 300 MG CAPS (GABAPENTIN) 1 by mouth three times a day as needed pain  #90 x 6   Entered and Authorized by:   Tresa Garter MD   Signed by:   Tresa Garter MD on 10/29/2009   Method used:   Electronically to        [facility]  [address] [phone]* (retail)       [address]       [place], [place]  [address]       Ph: [phone]       Fax: [phone]   RxID:   [id number]

## 2010-11-04 NOTE — Assessment & Plan Note (Signed)
Summary: 3 MO ROV /NWS   Vital Signs:  Patient profile:   65 year old female Weight:      209 pounds Temp:     98.1 degrees F oral Pulse rate:   86 / minute BP sitting:   100 / 80  (left arm)  Vitals Entered By: Tora Perches (December 10, 2009 10:46 AM) CC: f/u Is Patient Diabetic? Yes   Primary Care Provider:  Trinna Post Drusilla Wampole,MD  CC:  f/u.  History of Present Illness: The patient presents for a follow up of hypertension, diabetes, cirrhosis, hyperlipidemia   Preventive Screening-Counseling & Management  Alcohol-Tobacco     Smoking Status: quit  Current Medications (verified): 1)  Spironolactone 100 Mg  Tabs (Spironolactone) .Marland Kitchen.. 1 By Mouth Qd 2)  Vitamin D3 1000 Unit  Tabs (Cholecalciferol) .Marland Kitchen.. 1 Qd 3)  Januvia 50 Mg  Tabs (Sitagliptin Phosphate) .Marland Kitchen.. 1 By Mouth Qd 4)  Omeprazole 20 Mg Cpdr (Omeprazole) .... One By Mouth Daily 5)  Freestyle Lite   Strp (Glucose Blood) .... Use Bid 6)  Freestyle Lancets   Misc (Lancets) .... Two Times A Day Prn 7)  Lactulose 10 Gm/74ml Soln (Lactulose) .... 30 Cc By Mouth 2-3 Times A Week 8)  Budeprion Sr 150 Mg Tb12 (Bupropion Hcl) .Marland Kitchen.. 1 By Mouth Q Am 9)  Citalopram Hydrobromide 10 Mg  Tabs (Citalopram Hydrobromide) .... Take 1 Tab By Mouth Daily 10)  Furosemide 40 Mg Tabs (Furosemide) .... Take One Tablet Daily As Needed Bad Swelling X 1-3 D 11)  Ulticare Short Pen Needles 31g X 8 Mm Misc (Insulin Pen Needle) .... Use As Directed 12)  Humalog Pen 100 Unit/ml Soln (Insulin Lispro (Human)) .... 7 To 14 Units Once Daily Depending On Cbg 13)  Bd Pen Needle Mini U/f 31g X 5 Mm Misc (Insulin Pen Needle) .... Use As Directed 14)  Gabapentin 100 Mg Caps (Gabapentin) .Marland Kitchen.. 1 By Mouth Three Times A Day Prn 15)  Gabapentin 300 Mg Caps (Gabapentin) .Marland Kitchen.. 1 By Mouth Three Times A Day As Needed Pain 16)  Soothe Xp  Soln (Artificial Tear Solution) .Marland Kitchen.. 1-2 Gtt Each Eye Three Times A Day Prn  Allergies: 1)  ! 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) CONSTIPATION (ICD-564.00) BOWEL OBSTRUCTION (ICD-560.9) WELL ADULT EXAM (ICD-V70.0) ROSACEA (ICD-695.3) CIRRHOSIS (ICD-571.5) DIABETES MELLITUS, TYPE II (ICD-250.00) HYPERTENSION (ICD-401.9) SUBARACHNOID HEMORRHAGE (ICD-430) DEPRESSION (ICD-311) ANEMIA-IRON 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    Physical Exam  General:  NAD overweight-appearing.   Nose:  External nasal examination shows no deformity or inflammation. Nasal mucosa are pink and moist without lesions or exudates. Mouth:  Not dry Lungs:  Clear throughout to auscultation. Heart:  Regular rate and rhythm; no murmurs, rubs or bruits. Abdomen:   obese abdomen with normal active bowel sounds and multiple  surgical scars in right upper quadrant and in midline. There was some  tenderness in RUQ. There was nol tenderness in the lower abdomen,  no mass or fullness. Msk:  Lumbar-sacral spine is tender to palpation over paraspinal muscles and painfull with the ROM    Extremities:  No edema B Neurologic:  Alert and  oriented x3;  grossly normal neurologically. Skin:  No rash Psych:  Alert and cooperative. not  suicidal and subdued.     Impression & Recommendations:  Problem # 1:  HYPERGLYCEMIA (ICD-790.29) Assessment Deteriorated  The following medications were removed from the medication list:    Januvia 50 Mg Tabs (Sitagliptin phosphate) .Marland Kitchen... 1 by mouth qd Her updated medication list for this problem includes:    Humalog Pen 100 Unit/ml Soln (Insulin lispro (human)) .Marland KitchenMarland KitchenMarland KitchenMarland Kitchen 7 to 14 units qid  Labs Reviewed: Creat: 0.9 (12/03/2009)     Problem # 2:  HYPERTENSION (ICD-401.9) Assessment: Comment Only  Her updated medication list for this problem includes:    Spironolactone 100 Mg Tabs (Spironolactone) .Marland Kitchen... 1 by mouth qd    Furosemide 40 Mg Tabs (Furosemide) .Marland Kitchen... Take one tablet daily as needed bad swelling x 1-3 d  Problem # 3:  DIABETES MELLITUS, TYPE II (ICD-250.00) Assessment: Deteriorated  The following medications were removed from the medication list:    Januvia 50 Mg Tabs (Sitagliptin phosphate) .Marland Kitchen... 1 by mouth once daily not effective Her updated medication list for this problem includes:    Humalog Pen 100 Unit/ml Soln (Insulin lispro (human)) .Marland KitchenMarland KitchenMarland KitchenMarland Kitchen 7 to 14 units qid  Labs Reviewed: Creat: 0.9 (12/03/2009)    Reviewed HgBA1c results: 9.6 (12/03/2009)  8.9 (10/11/2009)  Problem # 4:  CIRRHOSIS (ICD-571.5) Assessment: Unchanged The labs were reviewed with the patient.   Complete Medication List: 1)  Spironolactone 100 Mg Tabs (Spironolactone) .Marland Kitchen.. 1 by mouth qd 2)  Vitamin D3 1000 Unit Tabs (Cholecalciferol) .Marland Kitchen.. 1 qd 3)  Omeprazole 20 Mg Cpdr (Omeprazole) .... One by mouth daily 4)  Freestyle Lite Strp (Glucose blood) .... Use bid 5)  Freestyle Lancets Misc (Lancets) .... Two times a day prn 6)  Lactulose 10 Gm/43ml Soln (Lactulose) .... 30 cc by mouth 2-3 times a week 7)  Budeprion Sr 150 Mg Tb12 (Bupropion hcl) .Marland Kitchen.. 1 by mouth q am 8)  Citalopram Hydrobromide 10 Mg Tabs (Citalopram hydrobromide) .... Take 1 tab by mouth daily 9)  Furosemide 40 Mg Tabs  (Furosemide) .... Take one tablet daily as needed bad swelling x 1-3 d 10)  Ulticare Short Pen Needles 31g X 8 Mm Misc (Insulin pen needle) .... Use as directed 11)  Humalog Pen 100 Unit/ml Soln (Insulin lispro (human)) .... 7 to 14 units qid 12)  Bd Pen Needle Mini U/f 31g X 5 Mm Misc (Insulin pen needle) .... Use as directed 13)  Gabapentin 100 Mg Caps (Gabapentin) .Marland Kitchen.. 1 by mouth three times a day prn 14)  Gabapentin 300 Mg Caps (Gabapentin) .Marland Kitchen.. 1 by mouth three times a day as needed pain 15)  Soothe Xp Soln (Artificial tear solution) .Marland Kitchen.. 1-2 gtt each eye three times a day prn  Patient Instructions: 1)  Please schedule a follow-up appointment in 3 months. 2)  BMP prior to visit, ICD-9: 3)  Hepatic Panel prior to visit, ICD-9: 4)  TSH prior to visit, ICD-9: 5)  CBC w/ Diff prior to visit, ICD-9: 6)  HbgA1C prior to visit, ICD-9: 7)  INR 250.02  995.20 Prescriptions: HUMALOG PEN 100 UNIT/ML SOLN (INSULIN LISPRO (  HUMAN)) 7 to 14 units qid  #qs x 12   Entered and Authorized by:   Tresa Garter MD   Signed by:   Tresa Garter MD on 12/10/2009   Method used:   Print then Give to Patient   RxID:   [id number]

## 2010-11-04 NOTE — Assessment & Plan Note (Signed)
Summary: 2-3 wk f/u #/cd   Vital Signs:  Patient profile:   65 year old female Height:      63 inches Weight:      218 pounds BMI:     38.76 Temp:     98.4 degrees F oral Pulse rate:   76 / minute Pulse rhythm:   regular Resp:     16 per minute BP sitting:   108 / 76  (right arm) Cuff size:   large  Vitals Entered By: Lanier Prude, CMA(AAMA) (August 05, 2010 9:22 AM) CC: 3 wk f/u  c/o dysuria & hematuria Is Patient Diabetic? Yes   Primary Care Provider:  Trinna Post Plotnikov,MD  CC:  3 wk f/u  c/o dysuria & hematuria.  History of Present Illness: The patient presents for a follow up of hypertension, diabetes, hyperlipidemia. liver disease. The URI has resolved. C/o dark urine - blood/brown x 2 wks  Allergies: 1)  ! 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) CONSTIPATION (ICD-564.00) BOWEL OBSTRUCTION (ICD-560.9) WELL ADULT EXAM (ICD-V70.0) ROSACEA (ICD-695.3) CIRRHOSIS (ICD-571.5) DIABETES MELLITUS, TYPE II (ICD-250.00) HYPERTENSION (ICD-401.9) SUBARACHNOID HEMORRHAGE (ICD-430) DEPRESSION (ICD-311) ANEMIA-IRON 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  The patient denies fever, dyspnea on exertion, prolonged cough, melena, and hematochezia.    Physical Exam  General:  NAD overweight-appearing.   Eyes:  No corneal or conjunctival inflammation noted. EOMI. Perrla.  Nose:  External nasal examination shows no deformity or inflammation. R nostril w/dry blood Mouth:  Erythematous throat and intranasal mucosa c/w URI  Neck:  No deformities, masses, or tenderness noted. Lungs:  Clear throughout to auscultation. Heart:  Regular rate and rhythm; no murmurs, rubs or bruits. Abdomen:   obese abdomen with normal active bowel sounds and multiple surgical scars in right upper quadrant and in midline. There was no  tenderness in RUQ. There was nol tenderness in the lower abdomen,  no mass or fullness. Msk:  L distal anterolat shin is hyperpigmented area 11x20 cm -  not  tender R knee is tender w/ROM Dorsal R foot with 6 cm purple, tender and swollen area Extremities:  1+ left pedal edema and 1+ right pedal edema.   Neurologic:  Alert and  oriented x3;  grossly normal neurologically. Skin:  small bruises on forearms Psych:  Alert and cooperative. not suicidal and subdued.     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   Problem # 2:  CIRRHOSIS (ICD-571.5) Assessment: Unchanged The labs were reviewed with the patient.   Problem # 3:  DIABETES MELLITUS, TYPE II (ICD-250.00) Assessment: Unchanged  Her updated medication list for this problem includes:    Humalog Pen 100 Unit/ml Soln (Insulin  lispro (human)) .Marland Kitchen... 9 to 16 units qid  Problem # 4:  HYPERTENSION (ICD-401.9) Assessment: Unchanged  Her updated medication list for this problem includes:    Torsemide 20 Mg Tabs (Torsemide) .Marland Kitchen... 2 by mouth once daily for swelling    Spironolactone 100 Mg Tabs (Spironolactone) .Marland Kitchen... 1 by mouth qd  BP today: 108/76 Prior BP: 122/80  (07/15/2010)  Labs Reviewed: K+: 4.4 (07/10/2010) Creat: : 0.8 (07/10/2010)   Chol: 159 (06/13/2009)   HDL: 66.50 (06/13/2009)   LDL: 85 (06/13/2009)   TG: 37.0 (06/13/2009)  Problem # 5:  HEMATURIA UNSPECIFIED (ICD-599.70) Assessment: New UA is pending   Complete Medication List: 1)  Torsemide 20 Mg Tabs (Torsemide) .... 2 by mouth once daily for swelling 2)  Spironolactone 100 Mg Tabs (Spironolactone) .Marland Kitchen.. 1 by mouth qd 3)  Lactulose 10 Gm/33ml Soln (Lactulose) .... 30 cc by mouth 2-3 times a week 4)  Budeprion Sr 150 Mg Tb12 (Bupropion hcl) .Marland Kitchen.. 1 by mouth q am 5)  Citalopram Hydrobromide 10 Mg Tabs (Citalopram hydrobromide) .... Take 1 tab by mouth daily 6)  Ulticare Short Pen Needles 31g X 8 Mm Misc (Insulin pen needle) .... Use as directed 7)  Humalog Pen 100 Unit/ml Soln (Insulin lispro (human)) .... 9 to 16 units qid 8)  Bd Pen Needle Mini U/f 31g X 5 Mm Misc (Insulin pen needle) .... Use as directed 9)  Pennsaid 1.5 % Soln (Diclofenac sodium) .... 3-5 gtt on skin three times a day for pain 10)  Promethazine-codeine 6.25-10 Mg/43ml Syrp (Promethazine-codeine) .... 5-10 ml by mouth q id as needed cough 11)  Tessalon Perles 100 Mg Caps (Benzonatate) .Marland Kitchen.. 1-2 by mouth two times a day as needed cogh 12)  Align Caps (Probiotic product) .Marland Kitchen.. 1 by mouth once daily for your intesinal flora restoraion 13)  Freestyle Lite Strp (Glucose blood) .... Use bid 14)  Vitamin D3 1000 Unit Tabs (Cholecalciferol) .Marland Kitchen.. 1 qd 15)  Freestyle Lancets Misc (Lancets) .... Two times a day prn 16)  Accu-chek Compact Test Drum Strp (Glucose blood) .... Check blood sugar two times a day 17)  Gabapentin 400 Mg Caps (Gabapentin) .Marland Kitchen.. 1 by mouth three times a day as needed pain  Other Orders: Pneumococcal Vaccine (01027) Admin 1st Vaccine (25366)  Patient Instructions: 1)  Please schedule a follow-up appointment in 6 weeks. 2)  BMP prior to visit, ICD-9: 3)  Hepatic Panel prior to visit, ICD-9: 4)  CBC w/  Diff prior to visit, ICD-9: 5)  INR  571.5 Prescriptions: GABAPENTIN 400 MG CAPS (GABAPENTIN) 1 by mouth three times a day as needed pain  #90 x 3   Entered and Authorized by:   Tresa Garter MD   Signed by:   Tresa Garter MD on 08/05/2010   Method used:   Print then Give to Patient   RxID:   [id number]    Orders Added: 1)  Pneumococcal Vaccine [64332] 2)  Admin 1st Vaccine [90471] 3)  Est. Patient Level IV [95188]   Immunizations Administered:  Pneumonia Vaccine:    Vaccine Type: Pneumovax    Site: left deltoid    Mfr: Merck    Dose: 0.5 ml    Route: IM    Given by: Lanier Prude, CMA(AAMA)    Exp. Date: 01/30/2012    Lot #: [id number]    VIS given: 09/09/09 version given August 05, 2010.   Immunizations Administered:  Pneumonia Vaccine:    Vaccine Type: Pneumovax    Site:  left deltoid    Mfr: Merck    Dose: 0.5 ml    Route: IM    Given by: Lanier Prude, CMA(AAMA)    Exp. Date: 01/30/2012    Lot #: [id number]    VIS given: 09/09/09 version given August 05, 2010.

## 2010-11-04 NOTE — Assessment & Plan Note (Signed)
Summary: 2 WK ROV /NWS  #   Vital Signs:  Patient profile:   65 year old female Height:      63 inches Weight:      219 pounds BMI:     38.93 Temp:     97.5 degrees F oral Pulse rate:   84 / minute Pulse rhythm:   regular Resp:     16 per minute BP sitting:   122 / 80  (left arm) Cuff size:   large  Vitals Entered By: Lanier Prude, Beverly Gust) (July 15, 2010 3:27 PM) CC: 2 wk f/u Is Patient Diabetic? Yes Comments pt states she is not taking some of the meds on the current list.   Primary Care Provider:  Trinna Post Plotnikov,MD  CC:  2 wk f/u.  History of Present Illness: F/u URI and ankle swelling - not better C/o R foot pain and bruise x 3 wks  Current Medications (verified): 1)  Glimepiride 4 Mg Tabs (Glimepiride) .Marland Kitchen.. 1 Two Times A Day 2)  Torsemide 20 Mg Tabs (Torsemide) .Marland Kitchen.. 1 - 2 By Mouth Once Daily For Swelling 3)  Spironolactone 100 Mg  Tabs (Spironolactone) .Marland Kitchen.. 1 By Mouth Qd 4)  Lactulose 10 Gm/73ml Soln (Lactulose) .... 30 Cc By Mouth 2-3 Times A Week 5)  Budeprion Sr 150 Mg Tb12 (Bupropion Hcl) .Marland Kitchen.. 1 By Mouth Q Am 6)  Citalopram Hydrobromide 10 Mg  Tabs (Citalopram Hydrobromide) .... Take 1 Tab By Mouth Daily 7)  Ulticare Short Pen Needles 31g X 8 Mm Misc (Insulin Pen Needle) .... Use As Directed 8)  Humalog Pen 100 Unit/ml Soln (Insulin Lispro (Human)) .... 9 To 16 Units Qid 9)  Bd Pen Needle Mini U/f 31g X 5 Mm Misc (Insulin Pen Needle) .... Use As Directed 10)  Gabapentin 100 Mg Caps (Gabapentin) .Marland Kitchen.. 1 By Mouth Three Times A Day Prn 11)  Gabapentin 300 Mg Caps (Gabapentin) .Marland Kitchen.. 1 By Mouth Three Times A Day As Needed Pain 12)  Pennsaid 1.5 % Soln (Diclofenac Sodium) .... 3-5 Gtt On Skin Three Times A Day For Pain 13)  Promethazine-Codeine 6.25-10 Mg/56ml Syrp (Promethazine-Codeine) .... 5-10 Ml By Mouth Q Id As Needed Cough 14)  Tessalon Perles 100 Mg Caps (Benzonatate) .Marland Kitchen.. 1-2 By Mouth Two Times A Day As Needed Cogh 15)  Align  Caps (Probiotic Product) .Marland Kitchen..  1 By Mouth Once Daily For Your Intesinal Flora Restoraion 16)  Freestyle Lite   Strp (Glucose Blood) .... Use Bid 17)  Vitamin D3 1000 Unit  Tabs (Cholecalciferol) .Marland Kitchen.. 1 Qd 18)  Freestyle Lancets   Misc (Lancets) .... Two Times A Day Prn 19)  Accu-Chek Compact Test Drum  Strp (Glucose Blood) .... Check Blood Sugar Two Times A Day  Allergies (verified): 1)  ! 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) CONSTIPATION (ICD-564.00) BOWEL OBSTRUCTION (ICD-560.9) WELL ADULT EXAM (ICD-V70.0) ROSACEA (ICD-695.3) CIRRHOSIS (ICD-571.5) DIABETES MELLITUS, TYPE II (ICD-250.00) HYPERTENSION (ICD-401.9) SUBARACHNOID HEMORRHAGE (ICD-430) DEPRESSION (ICD-311) ANEMIA-IRON 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) LOW BACK PAIN (ICD-724.2) HEARING LOSS (ICD-389.9) UTI'S, CHRONIC (ICD-599.0) MORBID OBESITY (ICD-278.01) PANCREATITIS, HX OF (ICD-V12.70) NEPHROLITHIASIS, HX OF (ICD-V13.01) FAMILY HISTORY DIABETES 1ST DEGREE RELATIVE (ICD-V18.0)  Social History: Last updated: 04/02/2009 Occupation: disabled Married - may be separating 2009  still  married Former Smoker Illicit Drug Use - no Got herself a puppy 2010  Review of Systems  The patient denies anorexia, dyspnea on exertion, abdominal pain, and severe indigestion/heartburn.    Physical Exam  General:  NAD overweight-appearing.   Nose:  External nasal examination shows no deformity or inflammation. R nostril w/dry blood Mouth:  Erythematous throat and intranasal mucosa c/w URI  Lungs:  Clear throughout to auscultation. Heart:  Regular rate and rhythm; no murmurs, rubs or bruits. Abdomen:   obese abdomen with  normal active bowel sounds and multiple surgical scars in right upper quadrant and in midline. There was no  tenderness in RUQ. There was nol tenderness in the lower abdomen,  no mass or fullness. Msk:  L distal anterolat shin is hyperpigmented area 11x20 cm -  not  tender R knee is tender w/ROM Dorsal R foot with 6 cm purple, tender and swollen area Extremities:  1+ left pedal edema and 1+ right pedal edema.   Skin:  small bruises on forearms Psych:  Alert and cooperative. not suicidal and subdued.     Impression & Recommendations:  Problem # 1:  EDEMA (ICD-782.3) Assessment Unchanged  Her updated medication list for this problem includes:    Torsemide 20 Mg Tabs (Torsemide) .Marland Kitchen... 2 by mouth once daily for swelling    Spironolactone 100 Mg Tabs (Spironolactone) .Marland Kitchen... 1 by mouth qd  Problem # 2:  BRONCHITIS, ACUTE (ICD-466.0) Assessment: Unchanged  Her updated medication list for this problem includes:    Promethazine-codeine 6.25-10 Mg/71ml Syrp (Promethazine-codeine) .Marland Kitchen... 5-10 ml by mouth q id as needed cough    Tessalon Perles 100 Mg Caps (Benzonatate) .Marland Kitchen... 1-2 by mouth two times a day as needed cogh  Problem # 3:  FOOT PAIN (ICD-729.5) Assessment: New Poss E nodosa vs other  Problem # 4:  DIABETES MELLITUS, TYPE II (ICD-250.00) Assessment: Deteriorated  The following medications were removed from the medication list:    Glimepiride 4 Mg Tabs (Glimepiride) .Marland Kitchen... 1 two times a day Her updated medication list for this problem includes:    Humalog Pen 100 Unit/ml Soln (Insulin lispro (human)) .Marland Kitchen... 9 to 16 units qid  Labs Reviewed: Creat: 0.8 (07/10/2010)    Reviewed HgBA1c results: 9.2 (02/20/2010)  9.6 (12/03/2009)  Complete Medication List: 1)  Torsemide 20 Mg Tabs (Torsemide) .... 2 by mouth once daily for swelling 2)  Spironolactone 100 Mg Tabs (Spironolactone) .Marland Kitchen.. 1 by mouth qd 3)  Lactulose 10 Gm/48ml Soln (Lactulose) .... 30 cc by mouth 2-3 times a week 4)   Budeprion Sr 150 Mg Tb12 (Bupropion hcl) .Marland Kitchen.. 1 by mouth q am 5)  Citalopram Hydrobromide 10 Mg Tabs (Citalopram hydrobromide) .... Take 1 tab by mouth daily 6)  Ulticare Short Pen Needles 31g X 8 Mm Misc (Insulin pen needle) .... Use as directed 7)  Humalog Pen 100 Unit/ml Soln (Insulin lispro (human)) .... 9 to 16 units qid 8)  Bd Pen Needle Mini U/f 31g X 5 Mm Misc (Insulin pen needle) .... Use as directed 9)  Gabapentin 100 Mg Caps (Gabapentin) .Marland Kitchen.. 1 by mouth three times a day prn 10)  Gabapentin 300 Mg Caps (Gabapentin) .Marland Kitchen.. 1 by mouth three times a day as needed pain 11)  Pennsaid 1.5 % Soln (Diclofenac sodium) .... 3-5 gtt on skin three times a day for pain 12)  Promethazine-codeine 6.25-10 Mg/32ml Syrp (Promethazine-codeine) .... 5-10 ml by mouth q id as needed cough 13)  Tessalon Perles 100 Mg Caps (  Benzonatate) .Marland Kitchen.. 1-2 by mouth two times a day as needed cogh 14)  Align Caps (Probiotic product) .Marland Kitchen.. 1 by mouth once daily for your intesinal flora restoraion 15)  Freestyle Lite Strp (Glucose blood) .... Use bid 16)  Vitamin D3 1000 Unit Tabs (Cholecalciferol) .Marland Kitchen.. 1 qd 17)  Freestyle Lancets Misc (Lancets) .... Two times a day prn 18)  Accu-chek Compact Test Drum Strp (Glucose blood) .... Check blood sugar two times a day  Patient Instructions: 1)  Please schedule a follow-up appointment in 2-3 weeks. 2)  BMP prior to visit, ICD-9: 3)  CBC w/ Diff prior to visit, ICD-9: 4)  ESR  995.20 5)  uric acid 250.00 Prescriptions: GABAPENTIN 300 MG CAPS (GABAPENTIN) 1 by mouth three times a day as needed pain  #90 x 6   Entered and Authorized by:   Tresa Garter MD   Signed by:   Tresa Garter MD on 07/15/2010   Method used:   Electronically to        [facility]  [address] [phone]* (retail)       [address]       [place], [place]  [address]       Ph: [phone]       Fax: [phone]   RxID:   [id number] GABAPENTIN 100 MG CAPS (GABAPENTIN) 1 by  mouth three times a day prn  #90 Capsule x 6   Entered and Authorized by:   Tresa Garter MD   Signed by:   Tresa Garter MD on 07/15/2010   Method used:   Electronically to        [facility]  [address] [phone]* (retail)       [address]       [place], [place]  [address]       Ph: [phone]       Fax: [phone]   RxID:   [id number] SPIRONOLACTONE 100 MG  TABS (SPIRONOLACTONE) 1 by mouth qd  #30 x 6   Entered and Authorized by:   Tresa Garter MD   Signed by:   Tresa Garter MD on 07/15/2010   Method used:   Electronically to        [facility]  [address] [phone]* (retail)       [address]       [place], [place]  [address]       Ph: [phone]       Fax: [phone]   RxID:   [id number] TORSEMIDE 20 MG TABS (TORSEMIDE) 2 by mouth once daily for swelling  #60 x 6   Entered and Authorized by:   Tresa Garter MD   Signed by:   Tresa Garter MD on 07/15/2010   Method used:   [facility]ronically to        [facility]  [address] [id number] (retail)       [address]       [place], [place]  [address]       Ph: [phone]       Fax: [phone]   RxID:   [id number] GABAPENTIN 300 MG CAPS (GABAPENTIN) 1 by mouth three times a day as needed pain  #90 x 6   Entered and Authorized by:   Georgina Quint  Plotnikov MD   Signed by:   Tresa Garter MD on 07/15/2010   Method used:   Print then Give to Patient   RxID:   [id number] GABAPENTIN 100 MG CAPS (GABAPENTIN) 1 by mouth three times a day prn  #90 Capsule x 6   Entered and Authorized by:   Tresa Garter MD   Signed by:   Tresa Garter MD on 07/15/2010   Method used:   Print then Give to Patient   RxID:   [id number] SPIRONOLACTONE 100 MG  TABS (SPIRONOLACTONE) 1 by mouth qd  #30 x 6   Entered and Authorized by:   Tresa Garter MD   Signed by:   Tresa Garter MD on 07/15/2010   Method used:    Print then Give to Patient   RxID:   [id number] TORSEMIDE 20 MG TABS (TORSEMIDE) 2 by mouth once daily for swelling  #60 x 6   Entered and Authorized by:   Tresa Garter MD   Signed by:   Tresa Garter MD on 07/15/2010   Method used:   Print then Give to Patient   RxID:   [id number]

## 2010-11-04 NOTE — Letter (Signed)
[facility]: [facility]   Imported By: Sherian Rein 09/09/2010 13:34:35  _____________________________________________________________________  External Attachment:    Type:   Image     Comment:   External Document

## 2010-11-04 NOTE — Progress Notes (Signed)
Summary: CT results  Phone Note Call from Patient   Caller: Patient Summary of Call: pt requesting results from CT. please advise Initial call taken by: Lanier Prude, [facility]),  August 22, 2010 1:53 PM  Follow-up for Phone Call        The bleeding is likely due to kidney stones. If persists - we need to see a urologist. Let me know.  IMPRESSION:   1.  Bilateral renal calculi without hydronephrosis or ureteric stone. 2.  Severe cirrhosis and portal venous hypertension. 3.  Possible constipation. 4.  Decreased size of a abdominal wall fluid collection since 2007. 5.  Chronic L1 compression deformity with minimal canal encroachment.   Follow-up by: Tresa Garter MD,  August 22, 2010 2:24 PM  Additional Follow-up for Phone Call Additional follow up Details #1::        Pt informed, she would like to go ahead with the urology consult.  Additional Follow-up by: Lamar Sprinkles, CMA,  August 22, 2010 5:44 PM    Additional Follow-up for Phone Call Additional follow up Details #2::    ok Follow-up by: Tresa Garter MD,  August 22, 2010 5:45 PM

## 2010-11-04 NOTE — Assessment & Plan Note (Signed)
Summary: SWOLLEN FEET W/PAIN---STC   Vital Signs:  Patient profile:   65 year old female Height:      63 inches Weight:      219 pounds BMI:     38.93 Temp:     99.3 degrees F oral Pulse rate:   76 / minute Pulse rhythm:   regular Resp:     16 per minute BP sitting:   130 / 80  (left arm) Cuff size:   large  Vitals Entered By: Lanier Prude, Beverly Gust) (July 01, 2010 3:40 PM) CC: blateral feet swelling, Ear pain, cough Is Patient Diabetic? Yes   Primary Care Provider:  Trinna Post Plotnikov,MD  CC:  blateral feet swelling, Ear pain, and cough.  History of Present Illness: C/o leg swelling both sides x 5 d C/o earache B C/o cough - not better  Current Medications (verified): 1)  Spironolactone 100 Mg  Tabs (Spironolactone) .Marland Kitchen.. 1 By Mouth Qd 2)  Vitamin D3 1000 Unit  Tabs (Cholecalciferol) .Marland Kitchen.. 1 Qd 3)  Freestyle Lite   Strp (Glucose Blood) .... Use Bid 4)  Freestyle Lancets   Misc (Lancets) .... Two Times A Day Prn 5)  Lactulose 10 Gm/46ml Soln (Lactulose) .... 30 Cc By Mouth 2-3 Times A Week 6)  Budeprion Sr 150 Mg Tb12 (Bupropion Hcl) .Marland Kitchen.. 1 By Mouth Q Am 7)  Citalopram Hydrobromide 10 Mg  Tabs (Citalopram Hydrobromide) .... Take 1 Tab By Mouth Daily 8)  Furosemide 40 Mg Tabs (Furosemide) .... Take One Tablet Daily As Needed Bad Swelling X 1-3 D 9)  Ulticare Short Pen Needles 31g X 8 Mm Misc (Insulin Pen Needle) .... Use As Directed 10)  Humalog Pen 100 Unit/ml Soln (Insulin Lispro (Human)) .... 9 To 16 Units Qid 11)  Bd Pen Needle Mini U/f 31g X 5 Mm Misc (Insulin Pen Needle) .... Use As Directed 12)  Gabapentin 100 Mg Caps (Gabapentin) .Marland Kitchen.. 1 By Mouth Three Times A Day Prn 13)  Gabapentin 300 Mg Caps (Gabapentin) .Marland Kitchen.. 1 By Mouth Three Times A Day As Needed Pain 14)  Pennsaid 1.5 % Soln (Diclofenac Sodium) .... 3-5 Gtt On Skin Three Times A Day For Pain 15)  Accu-Chek Compact Test Drum  Strp (Glucose Blood) .... Check Blood Sugar Two Times A Day 16)  Ceftin 250 Mg Tabs  (Cefuroxime Axetil) .Marland Kitchen.. 1 By Mouth Two Times A Day 17)  Glimepiride 4 Mg Tabs (Glimepiride) .Marland Kitchen.. 1 Two Times A Day 18)  Bactrim Ds 800-160 Mg Tabs (Sulfamethoxazole-Trimethoprim) .Marland Kitchen.. 1 By Mouth Bid 19)  Promethazine-Codeine 6.25-10 Mg/68ml Syrp (Promethazine-Codeine) .... 5-10 Ml By Mouth Q Id As Needed Cough 20)  Tessalon Perles 100 Mg Caps (Benzonatate) .Marland Kitchen.. 1-2 By Mouth Two Times A Day As Needed Cogh  Allergies (verified): 1)  ! 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) CONSTIPATION (ICD-564.00) BOWEL OBSTRUCTION (ICD-560.9) WELL ADULT EXAM (ICD-V70.0) ROSACEA (ICD-695.3) CIRRHOSIS (ICD-571.5) DIABETES MELLITUS, TYPE II (ICD-250.00) HYPERTENSION (ICD-401.9) SUBARACHNOID HEMORRHAGE (ICD-430) DEPRESSION (ICD-311) ANEMIA-IRON 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) LOW BACK PAIN (ICD-724.2) HEARING LOSS (ICD-389.9) UTI'S, CHRONIC (ICD-599.0) MORBID OBESITY (ICD-278.01) PANCREATITIS, HX OF (ICD-V12.70) NEPHROLITHIASIS, HX OF (ICD-V13.01) FAMILY HISTORY DIABETES 1ST DEGREE RELATIVE (ICD-V18.0)  Social History: Last updated: 04/02/2009 Occupation: disabled Married - may be separating 2009  still  married Former Smoker Illicit Drug Use - no 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   Mouth:  Erythematous throat and intranasal mucosa c/w URI  Lungs:  Clear throughout to auscultation. Heart:  Regular rate and rhythm; no murmurs, rubs or bruits. Abdomen:   obese abdomen with normal active bowel sounds and multiple surgical scars in right upper quadrant and in midline. There was some  tenderness in RUQ. There was nol  tenderness in the lower abdomen,  no mass or fullness. Extremities:  1+ left pedal edema and 1+ right pedal edema.   Skin:  small bruises on forearms Psych:  Alert and cooperative. not suicidal and subdued.     Impression & Recommendations:  Problem # 1:  BRONCHITIS, ACUTE (ICD-466.0) Assessment Unchanged  The following medications were removed from the medication list:    Ceftin 250 Mg Tabs (Cefuroxime axetil) .Marland Kitchen... 1 by mouth two times a day    Bactrim Ds 800-160 Mg Tabs (Sulfamethoxazole-trimethoprim) .Marland Kitchen... 1 by mouth bid Her updated medication list for this problem includes:    Promethazine-codeine 6.25-10 Mg/75ml Syrp (Promethazine-codeine) .Marland Kitchen... 5-10 ml by mouth q id as needed cough    Tessalon Perles 100 Mg Caps (Benzonatate) .Marland Kitchen... 1-2 by mouth two times a day as needed cogh    Clindamycin Hcl 150 Mg Caps (Clindamycin hcl) .Marland Kitchen... 1 by mouth three times a day - use w/caution. Take w/Align.  Problem # 2:  EDEMA (ICD-782.3) B LE - poss due to Bactrim Assessment: Deteriorated  The following medications were removed from the medication list:    Furosemide 40 Mg Tabs (Furosemide) .Marland Kitchen... Take one tablet daily as needed bad swelling x 1-3 d Her updated medication list for this problem includes:    Torsemide 20 Mg Tabs (Torsemide) .Marland Kitchen... 1 - 2 by mouth once daily for swelling    Spironolactone 100 Mg Tabs (Spironolactone) .Marland Kitchen... 1 by mouth qd  Problem # 3:  Cirrhosis Assessment: Unchanged On the regimen of medicine(s) reflected in the chart    Complete Medication List: 1)  Glimepiride 4 Mg Tabs (Glimepiride) .Marland Kitchen.. 1 two times a day 2)  Torsemide 20 Mg Tabs (Torsemide) .Marland Kitchen.. 1 - 2 by mouth once daily for swelling 3)  Spironolactone 100 Mg Tabs (Spironolactone) .Marland Kitchen.. 1 by mouth qd 4)  Lactulose 10 Gm/33ml Soln (Lactulose) .... 30 cc by mouth 2-3 times a week 5)  Budeprion Sr 150 Mg Tb12 (Bupropion hcl) .Marland Kitchen.. 1 by mouth q am 6)  Citalopram Hydrobromide 10 Mg Tabs (Citalopram hydrobromide)  .... Take 1 tab by mouth daily 7)  Ulticare Short Pen Needles 31g X 8 Mm Misc (Insulin pen needle) .... Use as directed 8)  Humalog Pen 100 Unit/ml Soln (Insulin lispro (human)) .... 9 to 16 units qid 9)  Bd Pen Needle Mini U/f 31g X 5 Mm Misc (Insulin pen needle) .... Use as directed 10)  Gabapentin 100 Mg Caps (Gabapentin) .Marland Kitchen.. 1 by mouth three times a day prn 11)  Gabapentin 300 Mg Caps (Gabapentin) .Marland Kitchen.. 1 by mouth three times a day as needed pain 12)  Pennsaid 1.5 % Soln (Diclofenac sodium) .... 3-5 gtt on skin three times a day for pain 13)  Promethazine-codeine 6.25-10 Mg/52ml Syrp (Promethazine-codeine) .... 5-10 ml by mouth q id as needed cough 14)  Tessalon Perles 100 Mg Caps (Benzonatate) .Marland Kitchen.. 1-2 by mouth two times a day as needed cogh 15)  Clindamycin Hcl 150 Mg Caps (Clindamycin hcl) .Marland KitchenMarland KitchenMarland Kitchen 1  by mouth tid 16)  Align Caps (Probiotic product) .Marland Kitchen.. 1 by mouth once daily for your intesinal flora restoraion 17)  Freestyle Lite Strp (Glucose blood) .... Use bid 18)  Vitamin D3 1000 Unit Tabs (Cholecalciferol) .Marland Kitchen.. 1 qd 19)  Freestyle Lancets Misc (Lancets) .... Two times a day prn 20)  Accu-chek Compact Test Drum Strp (Glucose blood) .... Check blood sugar two times a day  Patient Instructions: 1)  Please schedule a follow-up appointment in 2 weeks. 2)  BMP prior to visit, ICD-9: 3)  Hepatic Panel prior to visit, ICD-9: 4)  CBC w/ Diff prior to visit, ICD-9: 995 5)  Call if you are not better in a reasonable amount of time or if worse. Go to ER if feeling really bad!  Prescriptions: TORSEMIDE 20 MG TABS (TORSEMIDE) 1 - 2 by mouth once daily for swelling  #60 x 6   Entered and Authorized by:   Tresa Garter MD   Signed by:   Tresa Garter MD on 07/01/2010   Method used:   Print then Give to Patient   RxID:   [id number] ALIGN  CAPS (PROBIOTIC PRODUCT) 1 by mouth once daily for your intesinal flora restoraion  #30 x 0   Entered and Authorized by:   Tresa Garter  MD   Signed by:   Tresa Garter MD on 07/01/2010   Method used:   Print then Give to Patient   RxID:   [id number] CLINDAMYCIN HCL 150 MG CAPS (CLINDAMYCIN HCL) 1 by mouth tid  #30 x 0   Entered and Authorized by:   Tresa Garter MD   Signed by:   Tresa Garter MD on 07/01/2010   Method used:   Print then Give to Patient   RxID:   [id number]

## 2010-11-04 NOTE — Progress Notes (Signed)
Summary: lab results-UA  Phone Note Call from Patient   Caller: Patient Summary of Call: Pt called wanted to know lab results. Callback at [phone]. Thanks.  Initial call taken by: Alysia Penna,  August 06, 2010 4:00 PM  Follow-up for Phone Call        labs as before - not much change Follow-up by: Tresa Garter MD,  August 06, 2010 5:53 PM  Additional Follow-up for Phone Call Additional follow up Details #1::        Pt advised of lab results but is now requesting results of UA. I see in OV notes that UA is pending but there are no results, please advise. Additional Follow-up by: Margaret Pyle, CMA,  August 07, 2010 8:49 AM    Additional Follow-up for Phone Call Additional follow up Details #2::    UA was not collected. called pt to inform she can come give specimen per AVP. No answer/machine Lanier Prude, [facility])  August 07, 2010 2:25 PM    No answer/machine again...Marland KitchenMarland KitchenLanier Prude, [facility])  August 07, 2010 4:16 PM   Patient notified and will come by lab.Alvy Beal Archie CMA  August 07, 2010 4:50 PM   Additional Follow-up for Phone Call Additional follow up Details #3:: Details for Additional Follow-up Action Taken:

## 2010-11-04 NOTE — Assessment & Plan Note (Signed)
Summary: [place] / KNEE IS SORE, BRUISED, SWOLLEN AND .Marland KitchenMarland Kitchen   Vital Signs:  Patient profile:   65 year old female Height:      63 inches (160.02 cm) Weight:      209 pounds (95.00 kg) BMI:     37.16 O2 Sat:      93 % on Room air Temp:     98.7 degrees F (37.06 degrees C) oral Pulse rate:   87 / minute Pulse rhythm:   regular Resp:     16 per minute BP sitting:   102 / 68  (left arm) Cuff size:   large  Vitals Entered By: Lanier Prude, CMA(AAMA) (April 11, 2010 9:35 AM)  O2 Flow:  Room air CC: left leg swollen/painful due to fall  >4 wks ago.  Rt arm red/swollen/itching Is Patient Diabetic? Yes   Primary Care Provider:  Trinna Post Plotnikov,MD  CC:  left leg swollen/painful due to fall  >4 wks ago.  Rt arm red/swollen/itching.  History of Present Illness: C/o L shin pain after a fall 3 wks ago (tripped), rash on L lower shin is painful and spreading C/o R knee pain now L knee is much better after inj, asking me to inj.R knee  Current Medications (verified): 1)  Spironolactone 100 Mg  Tabs (Spironolactone) .Marland Kitchen.. 1 By Mouth Qd 2)  Vitamin D3 1000 Unit  Tabs (Cholecalciferol) .Marland Kitchen.. 1 Qd 3)  Omeprazole 20 Mg Cpdr (Omeprazole) .... One By Mouth Daily 4)  Freestyle Lite   Strp (Glucose Blood) .... Use Bid 5)  Freestyle Lancets   Misc (Lancets) .... Two Times A Day Prn 6)  Lactulose 10 Gm/24ml Soln (Lactulose) .... 30 Cc By Mouth 2-3 Times A Week 7)  Budeprion Sr 150 Mg Tb12 (Bupropion Hcl) .Marland Kitchen.. 1 By Mouth Q Am 8)  Citalopram Hydrobromide 10 Mg  Tabs (Citalopram Hydrobromide) .... Take 1 Tab By Mouth Daily 9)  Furosemide 40 Mg Tabs (Furosemide) .... Take One Tablet Daily As Needed Bad Swelling X 1-3 D 10)  Ulticare Short Pen Needles 31g X 8 Mm Misc (Insulin Pen Needle) .... Use As Directed 11)  Humalog Pen 100 Unit/ml Soln (Insulin Lispro (Human)) .... 7 To 14 Units Qid 12)  Bd Pen Needle Mini U/f 31g X 5 Mm Misc (Insulin Pen Needle) .... Use As Directed 13)  Gabapentin  100 Mg Caps (Gabapentin) .Marland Kitchen.. 1 By Mouth Three Times A Day Prn 14)  Gabapentin 300 Mg Caps (Gabapentin) .Marland Kitchen.. 1 By Mouth Three Times A Day As Needed Pain 15)  Soothe Xp  Soln (Artificial Tear Solution) .Marland Kitchen.. 1-2 Gtt Each Eye Three Times A Day Prn 16)  Pennsaid 1.5 % Soln (Diclofenac Sodium) .... 3-5 Gtt On Skin Three Times A Day For Pain 17)  Triamcinolone Acetonide 0.5 % Crea (Triamcinolone Acetonide) .... Use Two Times A Day Prn 18)  Accu-Chek Compact Test Drum  Strp (Glucose Blood) .... Check Blood Sugar Two Times A Day  Allergies (verified): 1)  ! 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    Physical Exam  General:  NAD overweight-appearing.   Mouth:  Not dry Lungs:  Clear throughout to auscultation. Heart:  Regular rate and rhythm; no murmurs,  rubs or bruits. Abdomen:   obese abdomen with normal active bowel sounds and multiple surgical scars in right upper quadrant and in midline. There was some  tenderness in RUQ. There was nol tenderness in the lower abdomen,  no mass or fullness. Msk:  L distal anterolat shin is tender with purple erythema area 11x20 cm - warm and tender R knee is tender w/ROM Extremities:  No edema Neurologic:  Alert and  oriented x3;  grossly normal neurologically. Skin:  as above Psych:  Alert and cooperative. not suicidal and subdued.     Impression & Recommendations:  Problem # 1:  CELLULITIS AND ABSCESS OF LEG EXCEPT FOOT (ICD-682.6) L Assessment New  The following medications were removed from the medication list:    Cephalexin 250 Mg Tabs (Cephalexin) .Marland Kitchen... 2 by mouth two times a day for infection Her updated medication list for this problem includes:    Cephalexin 500 Mg Tabs (Cephalexin) .Marland Kitchen... 1 by mouth qid  Orders: Prescription Created Electronically [phone]  Problem # 2:  KNEE PAIN (ICD-719.46) L  better Assessment: Improved  Problem # 3:  KNEE PAIN (ICD-719.46) R - OA Assessment: Unchanged I can inject it later when the infection is treated  Problem # 4:  THROMBOCYTOPENIA (ICD-287.5) Assessment: Unchanged  Complete Medication List: 1)  Spironolactone 100 Mg Tabs (Spironolactone) .Marland Kitchen.. 1 by mouth qd 2)  Vitamin D3 1000 Unit Tabs (Cholecalciferol) .Marland Kitchen.. 1 qd 3)  Omeprazole 20 Mg Cpdr (Omeprazole) .... One by mouth daily 4)  Freestyle Lite Strp (Glucose blood) .... Use bid 5)  Freestyle Lancets Misc (Lancets) .... Two times a day prn 6)  Lactulose 10 Gm/13ml Soln (Lactulose) .... 30 cc by mouth 2-3 times a week 7)  Budeprion Sr 150 Mg Tb12 (Bupropion hcl) .Marland Kitchen.. 1 by mouth q am 8)  Citalopram Hydrobromide 10 Mg Tabs (Citalopram hydrobromide) .... Take 1 tab by mouth daily 9)  Furosemide 40 Mg Tabs (Furosemide) .... Take one tablet daily as needed bad swelling x 1-3 d 10)  Ulticare  Short Pen Needles 31g X 8 Mm Misc (Insulin pen needle) .... Use as directed 11)  Humalog Pen 100 Unit/ml Soln (Insulin lispro (human)) .... 7 to 14 units qid 12)  Bd Pen Needle Mini U/f 31g X 5 Mm Misc (Insulin pen needle) .... Use as directed 13)  Gabapentin 100 Mg Caps (Gabapentin) .Marland Kitchen.. 1 by mouth three times a day prn 14)  Gabapentin 300 Mg Caps (Gabapentin) .Marland Kitchen.. 1 by mouth three times a day as needed pain 15)  Soothe Xp Soln (Artificial tear solution) .Marland Kitchen.. 1-2 gtt each eye three times a day prn 16)  Pennsaid 1.5 % Soln (Diclofenac sodium) .... 3-5 gtt on skin three times a day for pain 17)  Triamcinolone Acetonide 0.5 % Crea (Triamcinolone acetonide) .... Use two times a day prn 18)  Accu-chek Compact Test [employer]  Strp (Glucose blood) .... Check blood sugar two times a day 19)  Cephalexin 500 Mg Tabs (Cephalexin) .Marland Kitchen.. 1 by mouth qid  Patient Instructions: 1)  Please schedule a follow-up appointment in 2-3 weeks. Prescriptions: CEPHALEXIN 500 MG TABS (CEPHALEXIN) 1 by mouth qid  #28 x 1   Entered and Authorized by:   Tresa Garter MD   Signed by:   Tresa Garter MD on 04/11/2010   Method used:   Print then Give to Patient   RxID:   [id number] CEPHALEXIN 500 MG TABS (CEPHALEXIN) 1 by mouth qid  #28 x 1   Entered and Authorized by:   Tresa Garter MD   Signed by:   Tresa Garter MD on 04/11/2010   Method used:   [facility]        [facility]  [address] [phone]* (retail)       [address]       [place], [place]  [address]       Ph: [phone]       Fax: [phone]   RxID:   [id number] CEPHALEXIN 250 MG TABS (CEPHALEXIN) 2 by mouth two times a day for infection  #40 x 0   Entered and Authorized by:   Tresa Garter MD   Signed by:   Tresa Garter MD on 04/11/2010   Method used:   [facility]        [facility]  [address] [phone]* (retail)       [address]       [place], [place]   [address]       Ph: [phone]       Fax: [phone]   RxID:   [id number]

## 2010-11-04 NOTE — Assessment & Plan Note (Signed)
Summary: KNEE PAIN/ WANTS A CORTISONE SHOT /NWS   Vital Signs:  Patient profile:   65 year old female Height:      63 inches (160.02 cm) Weight:      210 pounds (95.45 kg) BMI:     37.33 O2 Sat:      92 % on Room air Temp:     99.3 degrees F (37.39 degrees C) oral Pulse rate:   93 / minute BP sitting:   116 / 74  (left arm) Cuff size:   large  Vitals Entered By: Lucious Groves (March 21, 2010 4:39 PM)  O2 Flow:  Room air  Procedure Note  Injections: The patient complains of pain. Duration of symptoms: > 3 months Indication: chronic pain Consent signed: yes  Procedure # 1: joint injection    Region: lateral    Location: L knee    Technique: 25 g needle    Medication: 40 mg depomedrol    Anesthesia: 3.0 ml 1% lidocaine w/o epinephrine    Comment: Risks including but not limited by incomplete procedure, bleeding with low plt count, infection, recurrence were discussed with the patient. Consent form was signed. Tolerated well. Complicatons - none. I used a 25g needle - a single passage was carried out. Good pain relief following the procedure.   Cleaned and prepped with: alcohol and betadine Wound dressing: bandaid and pressure dressing  CC: C/O ongoing knee pain and requests cortisone injection./kb Is Patient Diabetic? Yes Pain Assessment Patient in pain? yes     Location: knee Intensity: 8 Onset of pain  ongoing Comments Patient notes that previous meds for knee pain are not working./kb   Primary Care Provider:  Trinna Post Lafern Brinkley,MD  CC:  C/O ongoing knee pain and requests cortisone injection./kb.  History of Present Illness: C/o worsened L knee pain w/o swelling. She is asking for an injection - going to the beach in 2 d. No injury. No bleeding.  Current Medications (verified): 1)  Spironolactone 100 Mg  Tabs (Spironolactone) .Marland Kitchen.. 1 By Mouth Qd 2)  Vitamin D3 1000 Unit  Tabs (Cholecalciferol) .Marland Kitchen.. 1 Qd 3)  Omeprazole 20 Mg Cpdr (Omeprazole) .... One By Mouth  Daily 4)  Freestyle Lite   Strp (Glucose Blood) .... Use Bid 5)  Freestyle Lancets   Misc (Lancets) .... Two Times A Day Prn 6)  Lactulose 10 Gm/47ml Soln (Lactulose) .... 30 Cc By Mouth 2-3 Times A Week 7)  Budeprion Sr 150 Mg Tb12 (Bupropion Hcl) .Marland Kitchen.. 1 By Mouth Q Am 8)  Citalopram Hydrobromide 10 Mg  Tabs (Citalopram Hydrobromide) .... Take 1 Tab By Mouth Daily 9)  Furosemide 40 Mg Tabs (Furosemide) .... Take One Tablet Daily As Needed Bad Swelling X 1-3 D 10)  Ulticare Short Pen Needles 31g X 8 Mm Misc (Insulin Pen Needle) .... Use As Directed 11)  Humalog Pen 100 Unit/ml Soln (Insulin Lispro (Human)) .... 7 To 14 Units Qid 12)  Bd Pen Needle Mini U/f 31g X 5 Mm Misc (Insulin Pen Needle) .... Use As Directed 13)  Gabapentin 100 Mg Caps (Gabapentin) .Marland Kitchen.. 1 By Mouth Three Times A Day Prn 14)  Gabapentin 300 Mg Caps (Gabapentin) .Marland Kitchen.. 1 By Mouth Three Times A Day As Needed Pain 15)  Soothe Xp  Soln (Artificial Tear Solution) .Marland Kitchen.. 1-2 Gtt Each Eye Three Times A Day Prn 16)  Pennsaid 1.5 % Soln (Diclofenac Sodium) .... 3-5 Gtt On Skin Three Times A Day For Pain 17)  Triamcinolone Acetonide 0.5 % Crea (  Triamcinolone Acetonide) .... Use Two Times A Day Prn 18)  Accu-Chek Compact Test Drum  Strp (Glucose Blood) .... Check Blood Sugar Two Times A Day  Allergies (verified): 1)  ! Tetracycline Hcl (Tetracycline Hcl) 2)  Metformin Hcl 3)  Zithromax 4)  Cipro 5)  Augmentin (Amoxicillin-Pot Clavulanate)  Review of Systems       The patient complains of difficulty walking.  The patient denies peripheral edema, melena, hematochezia, hematuria, and abnormal bleeding.    Physical Exam  General:  NAD overweight-appearing.   Mouth:  Not dry Lungs:  Clear throughout to auscultation. Heart:  Regular rate and rhythm; no murmurs, rubs or bruits. Msk:  L knee tender w/ROM and no swelling Extremities:  No edema B Neurologic:  Alert and  oriented x3;  grossly normal neurologically. Skin:  No  rash   Impression & Recommendations:  Problem # 1:  KNEE PAIN (ZOX-096.04) L>>R Assessment Deteriorated  Options discussed. She insisted on inj.  Orders: Ace Wraps 3-5 in/yard  (V4098) Joint Aspirate / Injection, Large (20610)  Problem # 2:  DIABETES MELLITUS, TYPE II (ICD-250.00) Assessment: Comment Only Warned re: elev glu after a shot Her updated medication list for this problem includes:    Humalog Pen 100 Unit/ml Soln (Insulin lispro (human)) .Marland KitchenMarland KitchenMarland KitchenMarland Kitchen 7 to 14 units qid  Complete Medication List: 1)  Spironolactone 100 Mg Tabs (Spironolactone) .Marland Kitchen.. 1 by mouth qd 2)  Vitamin D3 1000 Unit Tabs (Cholecalciferol) .Marland Kitchen.. 1 qd 3)  Omeprazole 20 Mg Cpdr (Omeprazole) .... One by mouth daily 4)  Freestyle Lite Strp (Glucose blood) .... Use bid 5)  Freestyle Lancets Misc (Lancets) .... Two times a day prn 6)  Lactulose 10 Gm/31ml Soln (Lactulose) .... 30 cc by mouth 2-3 times a week 7)  Budeprion Sr 150 Mg Tb12 (Bupropion hcl) .Marland Kitchen.. 1 by mouth q am 8)  Citalopram Hydrobromide 10 Mg Tabs (Citalopram hydrobromide) .... Take 1 tab by mouth daily 9)  Furosemide 40 Mg Tabs (Furosemide) .... Take one tablet daily as needed bad swelling x 1-3 d 10)  Ulticare Short Pen Needles 31g X 8 Mm Misc (Insulin pen needle) .... Use as directed 11)  Humalog Pen 100 Unit/ml Soln (Insulin lispro (human)) .... 7 to 14 units qid 12)  Bd Pen Needle Mini U/f 31g X 5 Mm Misc (Insulin pen needle) .... Use as directed 13)  Gabapentin 100 Mg Caps (Gabapentin) .Marland Kitchen.. 1 by mouth three times a day prn 14)  Gabapentin 300 Mg Caps (Gabapentin) .Marland Kitchen.. 1 by mouth three times a day as needed pain 15)  Soothe Xp Soln (Artificial tear solution) .Marland Kitchen.. 1-2 gtt each eye three times a day prn 16)  Pennsaid 1.5 % Soln (Diclofenac sodium) .... 3-5 gtt on skin three times a day for pain 17)  Triamcinolone Acetonide 0.5 % Crea (Triamcinolone acetonide) .... Use two times a day prn 18)  Accu-chek Compact Test Drum Strp (Glucose blood) ....  Check blood sugar two times a day

## 2010-11-04 NOTE — Letter (Signed)
Summary: CMN for Diabetes Testing Supplies/[employer] [facility]  CMN for Diabetes Testing Supplies/[employer]   Imported By: Sherian Rein 01/01/2010 11:48:48  _____________________________________________________________________  External Attachment:    Type:   Image     Comment:   External Document

## 2010-11-04 NOTE — Assessment & Plan Note (Signed)
Summary: 3 MO ROV /NWS   Vital Signs:  Patient profile:   65 year old female Height:      63 inches Weight:      207.75 pounds BMI:     36.93 O2 Sat:      94 % on Room air Temp:     98.2 degrees F oral Pulse rate:   85 / minute BP sitting:   102 / 70  (left arm) Cuff size:   large  Vitals Entered ByZella Ball Ewing (Feb 25, 2010 9:13 AM)  O2 Flow:  Room air CC: 3 month ROV/RE   Primary Care Provider:  Trinna Post Orphia Mctigue,MD  CC:  3 month ROV/RE.  History of Present Illness: C/o L hand pain x 2 wks after she hit it against bed rail playing w/her dog. 2 days later it swell C/o itchy rash on R elbow and arm x 6 wks The patient presents for a follow up of hypertension, diabetes, cirrhosis   Current Medications (verified): 1)  Spironolactone 100 Mg  Tabs (Spironolactone) .Marland Kitchen.. 1 By Mouth Qd 2)  Vitamin D3 1000 Unit  Tabs (Cholecalciferol) .Marland Kitchen.. 1 Qd 3)  Omeprazole 20 Mg Cpdr (Omeprazole) .... One By Mouth Daily 4)  Freestyle Lite   Strp (Glucose Blood) .... Use Bid 5)  Freestyle Lancets   Misc (Lancets) .... Two Times A Day Prn 6)  Lactulose 10 Gm/29ml Soln (Lactulose) .... 30 Cc By Mouth 2-3 Times A Week 7)  Budeprion Sr 150 Mg Tb12 (Bupropion Hcl) .Marland Kitchen.. 1 By Mouth Q Am 8)  Citalopram Hydrobromide 10 Mg  Tabs (Citalopram Hydrobromide) .... Take 1 Tab By Mouth Daily 9)  Furosemide 40 Mg Tabs (Furosemide) .... Take One Tablet Daily As Needed Bad Swelling X 1-3 D 10)  Ulticare Short Pen Needles 31g X 8 Mm Misc (Insulin Pen Needle) .... Use As Directed 11)  Humalog Pen 100 Unit/ml Soln (Insulin Lispro (Human)) .... 7 To 14 Units Qid 12)  Bd Pen Needle Mini U/f 31g X 5 Mm Misc (Insulin Pen Needle) .... Use As Directed 13)  Gabapentin 100 Mg Caps (Gabapentin) .Marland Kitchen.. 1 By Mouth Three Times A Day Prn 14)  Gabapentin 300 Mg Caps (Gabapentin) .Marland Kitchen.. 1 By Mouth Three Times A Day As Needed Pain 15)  Soothe Xp  Soln (Artificial Tear Solution) .Marland Kitchen.. 1-2 Gtt Each Eye Three Times A Day Prn 16)  Pennsaid  1.5 % Soln (Diclofenac Sodium) .... 3-5 Gtt On Skin Three Times A Day For Pain  Allergies (verified): 1)  ! 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) CONSTIPATION (ICD-564.00) BOWEL OBSTRUCTION (ICD-560.9) WELL ADULT EXAM (ICD-V70.0) ROSACEA (ICD-695.3) CIRRHOSIS (ICD-571.5) DIABETES MELLITUS, TYPE II (ICD-250.00) HYPERTENSION (ICD-401.9) SUBARACHNOID HEMORRHAGE (ICD-430) DEPRESSION (ICD-311) ANEMIA-IRON 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  The patient denies fever, chest pain, peripheral  edema, melena, hematochezia, and hematuria.    Physical Exam  General:  NAD overweight-appearing.   Ears:  2 mm scab in L ear canal Nose:  External nasal examination shows no deformity or inflammation. Nasal mucosa are pink and moist without lesions or exudates. Mouth:  Not dry Lungs:  Clear throughout to auscultation. Heart:  Regular rate and rhythm; no murmurs, rubs or bruits. Abdomen:   obese abdomen with normal active bowel sounds and multiple surgical scars in right upper quadrant and in midline. There was some  tenderness in RUQ. There was nol tenderness in the lower abdomen,  no mass or  fullness. Msk:  L hand with purplish dorsal painful  large swelling Extremities:  No edema B Neurologic:  Alert and  oriented x3;  grossly normal neurologically. Skin:  No rash Psych:  Alert and cooperative. not suicidal and subdued.     Impression & Recommendations:  Problem # 1:  HAND PAIN (ICD-729.5) L - poss hematoma, dorsal Assessment New  Elevate hand  Orders: T-Hand Left 3 Views (73130TC) Ace Wraps 3-5 in/yard  (Z6109)  Problem # 2:  SKIN RASH (ICD-782.1) R arm - eczema vs other Assessment: New  Triamc bid  Her updated medication list for this problem includes:    Triamcinolone Acetonide 0.5 % Crea (Triamcinolone acetonide) ..... Use two times a day prn  Problem # 3:  THROMBOCYTOPENIA (ICD-287.5) Assessment: Deteriorated The labs were reviewed with the patient. Risk of bleeding discussed  Problem # 4:  HYPERGLYCEMIA (ICD-790.29) Assessment: Unchanged  Her updated medication list for this problem includes:    Humalog Pen 100 Unit/ml Soln (Insulin lispro (human)) .Marland KitchenMarland KitchenMarland KitchenMarland Kitchen 7 to 14 units qid  Labs Reviewed: Creat: 0.8 (02/20/2010)     Problem # 5:  CIRRHOSIS (ICD-571.5) Assessment: Unchanged  Problem # 6:  DIABETES MELLITUS, TYPE II (ICD-250.00) Assessment: Deteriorated  Her updated medication list for this problem includes:    Humalog Pen 100 Unit/ml Soln (Insulin lispro (human)) .Marland KitchenMarland KitchenMarland KitchenMarland Kitchen 7 to 14 units qid  Labs Reviewed: Creat: 0.8 (02/20/2010)    Reviewed HgBA1c results: 9.2 (02/20/2010)  9.6 (12/03/2009)  Complete Medication List: 1)  Spironolactone 100 Mg Tabs (Spironolactone) .Marland Kitchen.. 1 by mouth qd 2)  Vitamin D3 1000 Unit Tabs (Cholecalciferol) .Marland Kitchen.. 1 qd 3)  Omeprazole 20 Mg Cpdr (Omeprazole) .... One by mouth daily 4)  Freestyle Lite Strp (Glucose blood) .... Use bid 5)  Freestyle Lancets Misc (Lancets) .... Two times a day prn 6)  Lactulose 10 Gm/62ml Soln (Lactulose) .... 30 cc by mouth 2-3 times a week 7)  Budeprion Sr 150 Mg Tb12 (Bupropion hcl) .Marland Kitchen..  1 by mouth q am 8)  Citalopram Hydrobromide 10 Mg Tabs (Citalopram hydrobromide) .... Take 1 tab by mouth daily 9)  Furosemide 40 Mg Tabs (Furosemide) .... Take one tablet daily as needed bad swelling x 1-3 d 10)  Ulticare Short Pen Needles 31g X 8 Mm Misc (Insulin pen needle) .... Use as directed 11)  Humalog Pen 100 Unit/ml Soln (Insulin lispro (human)) .... 7 to 14 units qid 12)  Bd Pen Needle Mini U/f 31g X 5 Mm Misc (Insulin pen needle) .... Use as directed 13)  Gabapentin 100 Mg Caps (Gabapentin) .Marland Kitchen.. 1 by mouth three times a day prn 14)  Gabapentin 300 Mg Caps (Gabapentin) .Marland Kitchen.. 1 by mouth three times a day as needed pain 15)  Soothe Xp Soln (Artificial tear solution) .Marland Kitchen.. 1-2 gtt each eye three times a day prn 16)  Pennsaid 1.5 % Soln (Diclofenac  sodium) .... 3-5 gtt on skin three times a day for pain 17)  Triamcinolone Acetonide 0.5 % Crea (Triamcinolone acetonide) .... Use two times a day prn  Patient Instructions: 1)  Heat 2)  Elevate L hand 3)  Call if you are not better in a reasonable amount of time or if worse.  4)  Please schedule a follow-up appointment in 1 month. 5)  blood type 6)  BMP prior to visit, ICD-9: 7)  Hepatic Panel prior to visit, ICD-9: 8)  CBC w/ Diff prior to visit, ICD-9: 9)  INR 995.20 250.02 Prescriptions: TRIAMCINOLONE ACETONIDE 0.5 % CREA (TRIAMCINOLONE ACETONIDE) use two times a day prn  #60 g x 3   Entered by:   Orlan Leavens   Authorized by:   Tresa Garter MD   Signed by:   Orlan Leavens on 02/26/2010   Method used:   [facility]        [facility]  [address] [phone]* (retail)       [address]       [place], [place]  [address]       Ph: [phone]       Fax: [phone]   RxID:   [id number]

## 2010-11-04 NOTE — Progress Notes (Signed)
Summary: Prometh/Codeine  Phone Note Refill Request Message from:  Fax from Pharmacy  Refills Requested: Medication #1:  PROMETHAZINE-CODEINE 6.25-10 MG/5ML SYRP 5-10 ml by mouth q id as needed cough   Dosage confirmed as above?Dosage Confirmed   Supply Requested:   Last Refilled: 06/30/2010  Method Requested: Telephone to Pharmacy Initial call taken by: Lanier Prude, [facility]),  July 14, 2010 3:15 PM  Follow-up for Phone Call        ok to ref Follow-up by: Tresa Garter MD,  July 14, 2010 5:30 PM  Additional Follow-up for Phone Call Additional follow up Details #1::        Rx called to pharmacy Additional Follow-up by: Lanier Prude, [facility]),  July 15, 2010 8:24 AM    Prescriptions: PROMETHAZINE-CODEINE 6.25-10 MG/5ML SYRP (PROMETHAZINE-CODEINE) 5-10 ml by mouth q id as needed cough  #300 ml x 0   Entered by:   Lanier Prude, CMA(AAMA)   Authorized by:   Tresa Garter MD   Signed by:   Lanier Prude, CMA(AAMA) on 07/15/2010   Method used:   Telephoned to ...       [facility]  [address] [id number] ([facility]ail[facility]       [address]       [place], [place]  [address]       Ph: [phone]       Fax: [phone]   RxID:   [id number]

## 2010-11-04 NOTE — Progress Notes (Signed)
Summary: alternate rx  Phone Note From Pharmacy   [facility]  [address] [phone]* Summary of Call: Soothe XP eye drops are on back order. Pharm would like to know if we can provide alternate prescription. Please advise. Initial call taken by: Lucious Groves,  January 22, 2010 4:02 PM  Follow-up for Phone Call        What substitute does the Pharmacy have? Follow-up by: Tresa Garter MD,  January 22, 2010 5:44 PM  Additional Follow-up for Phone Call Additional follow up Details #1::        This is OTC product per pharmacist. Jovita Gamma ok for equivalent alternative.  Additional Follow-up by: Lamar Sprinkles, CMA,  January 23, 2010 4:42 PM

## 2010-11-04 NOTE — Progress Notes (Signed)
Summary: Critical LAB - PLOT PT  Phone Note From Other Clinic   Summary of Call: LAB CALLED W/CRITICAL: PLT 38, WBC 2.1 Initial call taken by: Lamar Sprinkles, CMA,  July 10, 2010 4:44 PM  Follow-up for Phone Call        reviewed most previous lab: WBC 3.0 and plt 56. This can wait for Dr. Posey Vanderlinden to review tomorrow. Follow-up by: Jacques Navy MD,  July 10, 2010 6:57 PM  Additional Follow-up for Phone Call Additional follow up Details #1::        Noted. Thx Additional Follow-up by: Tresa Garter MD,  July 11, 2010 1:07 PM

## 2010-11-04 NOTE — Assessment & Plan Note (Signed)
Summary: 2-4 WK ROV /NWS  #   Vital Signs:  Patient profile:   65 year old female Height:      63 inches Weight:      219 pounds BMI:     38.93 Temp:     98.7 degrees F oral Pulse rate:   68 / minute Pulse rhythm:   regular Resp:     16 per minute BP sitting:   98 / 62  (left arm) Cuff size:   large  Vitals Entered By: Lanier Prude, CMA(AAMA) (June 24, 2010 9:22 AM) CC: f/u c/o cough & sore throat X 3 wks Is Patient Diabetic? Yes Comments pt needs RF on Citalopram   Primary Care Lyall Faciane:  Trinna Post Plotnikov,MD  CC:  f/u c/o cough & sore throat X 3 wks.  History of Present Illness: The patient presents with complaints of sore throat, fever, cough, sinus congestion and drainge of 3 wks duration. Not better with OTC meds. Chest hurts with coughing. Can't sleep due to cough. Muscle aches are present.  The mucus is colored.   Current Medications (verified): 1)  Spironolactone 100 Mg  Tabs (Spironolactone) .Marland Kitchen.. 1 By Mouth Qd 2)  Vitamin D3 1000 Unit  Tabs (Cholecalciferol) .Marland Kitchen.. 1 Qd 3)  Freestyle Lite   Strp (Glucose Blood) .... Use Bid 4)  Freestyle Lancets   Misc (Lancets) .... Two Times A Day Prn 5)  Lactulose 10 Gm/30ml Soln (Lactulose) .... 30 Cc By Mouth 2-3 Times A Week 6)  Budeprion Sr 150 Mg Tb12 (Bupropion Hcl) .Marland Kitchen.. 1 By Mouth Q Am 7)  Citalopram Hydrobromide 10 Mg  Tabs (Citalopram Hydrobromide) .... Take 1 Tab By Mouth Daily 8)  Furosemide 40 Mg Tabs (Furosemide) .... Take One Tablet Daily As Needed Bad Swelling X 1-3 D 9)  Ulticare Short Pen Needles 31g X 8 Mm Misc (Insulin Pen Needle) .... Use As Directed 10)  Humalog Pen 100 Unit/ml Soln (Insulin Lispro (Human)) .... 9 To 16 Units Qid 11)  Bd Pen Needle Mini U/f 31g X 5 Mm Misc (Insulin Pen Needle) .... Use As Directed 12)  Gabapentin 100 Mg Caps (Gabapentin) .Marland Kitchen.. 1 By Mouth Three Times A Day Prn 13)  Gabapentin 300 Mg Caps (Gabapentin) .Marland Kitchen.. 1 By Mouth Three Times A Day As Needed Pain 14)  Pennsaid 1.5 % Soln  (Diclofenac Sodium) .... 3-5 Gtt On Skin Three Times A Day For Pain 15)  Accu-Chek Compact Test Drum  Strp (Glucose Blood) .... Check Blood Sugar Two Times A Day 16)  Ceftin 250 Mg Tabs (Cefuroxime Axetil) .Marland Kitchen.. 1 By Mouth Two Times A Day 17)  Glimepiride 4 Mg Tabs (Glimepiride) .Marland Kitchen.. 1 Two Times A Day  Allergies (verified): 1)  ! 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) CONSTIPATION (ICD-564.00) BOWEL OBSTRUCTION (ICD-560.9) WELL ADULT EXAM (ICD-V70.0) ROSACEA (ICD-695.3) CIRRHOSIS (ICD-571.5) DIABETES MELLITUS, TYPE II (ICD-250.00) HYPERTENSION (ICD-401.9) SUBARACHNOID HEMORRHAGE (ICD-430) DEPRESSION (ICD-311) ANEMIA-IRON 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) LOW BACK PAIN (ICD-724.2) HEARING LOSS (ICD-389.9) UTI'S, CHRONIC (ICD-599.0) MORBID OBESITY (ICD-278.01) PANCREATITIS, HX OF (ICD-V12.70) NEPHROLITHIASIS, HX OF (ICD-V13.01) FAMILY HISTORY DIABETES 1ST DEGREE RELATIVE (ICD-V18.0)  Social History: Last updated: 04/02/2009 Occupation: disabled Married - may be separating 2009      still  married Former Smoker Illicit Drug  Use - no Got herself a puppy 2010  Review of Systems       The patient complains of difficulty walking and depression.  The patient denies weight loss, syncope, dyspnea on exertion, abdominal pain, and melena.    Physical Exam  General:  NAD overweight-appearing.     Impression & Recommendations:  Problem # 1:  BRONCHITIS, ACUTE (ICD-466.0) vs pneumonia Assessment New  Her updated medication list for this problem includes:    Ceftin 250 Mg Tabs (Cefuroxime axetil) .Marland Kitchen... 1 by mouth two times a day    Bactrim Ds 800-160 Mg Tabs  (Sulfamethoxazole-trimethoprim) .Marland Kitchen... 1 by mouth bid    Promethazine-codeine 6.25-10 Mg/49ml Syrp (Promethazine-codeine) .Marland Kitchen... 5-10 ml by mouth q id as needed cough    Tessalon Perles 100 Mg Caps (Benzonatate) .Marland Kitchen... 1-2 by mouth two times a day as needed cogh did not help  Orders: T-2 View CXR, Same Day (71020.5TC)  Problem # 2:  DIABETES MELLITUS, TYPE II (ICD-250.00) Assessment: Unchanged  Her updated medication list for this problem includes:    Humalog Pen 100 Unit/ml Soln (Insulin lispro (human)) .Marland Kitchen... 9 to 16 units qid    Glimepiride 4 Mg Tabs (Glimepiride) .Marland Kitchen... 1 two times a day  Problem # 3:  CIRRHOSIS (ICD-571.5) Assessment: Unchanged On the regimen of medicine(s) reflected in the chart    Complete Medication List: 1)  Spironolactone 100 Mg Tabs (Spironolactone) .Marland Kitchen.. 1 by mouth qd 2)  Vitamin D3 1000 Unit Tabs (Cholecalciferol) .Marland Kitchen.. 1 qd 3)  Freestyle Lite Strp (Glucose blood) .... Use bid 4)  Freestyle Lancets Misc (Lancets) .... Two times a day prn 5)  Lactulose 10 Gm/44ml Soln (Lactulose) .... 30 cc by mouth 2-3 times a week 6)  Budeprion Sr 150 Mg Tb12 (Bupropion hcl) .Marland Kitchen.. 1 by mouth q am 7)  Citalopram Hydrobromide 10 Mg Tabs (Citalopram hydrobromide) .... Take 1 tab by mouth daily 8)  Furosemide 40 Mg Tabs (Furosemide) .... Take one tablet daily as needed bad swelling x 1-3 d 9)  Ulticare Short Pen Needles 31g X 8 Mm Misc (Insulin pen needle) .... Use as directed 10)  Humalog Pen 100 Unit/ml Soln (Insulin lispro (human)) .... 9 to 16 units qid 11)  Bd Pen Needle Mini U/f 31g X 5 Mm Misc (Insulin pen needle) .... Use as directed 12)  Gabapentin 100 Mg Caps (Gabapentin) .Marland Kitchen.. 1 by mouth three times a day prn 13)  Gabapentin 300 Mg Caps (Gabapentin) .Marland Kitchen.. 1 by mouth three times a day as needed pain 14)  Pennsaid 1.5 % Soln (Diclofenac sodium) .... 3-5 gtt on skin three times a day for pain 15)  Accu-chek Compact Test Drum Strp (Glucose blood) .... Check blood sugar two  times a day 16)  Ceftin 250 Mg Tabs (Cefuroxime axetil) .Marland Kitchen.. 1 by mouth two times a day 17)  Glimepiride 4 Mg Tabs (Glimepiride) .Marland Kitchen.. 1 two times a day 18)  Bactrim Ds 800-160 Mg Tabs (Sulfamethoxazole-trimethoprim) .Marland Kitchen.. 1 by mouth bid 19)  Promethazine-codeine 6.25-10 Mg/82ml Syrp (Promethazine-codeine) .... 5-10 ml by mouth q id as needed cough 20)  Tessalon Perles 100 Mg Caps (Benzonatate) .Marland Kitchen.. 1-2 by mouth two times a day as needed cogh  Patient Instructions: 1)  Call if you are not better in a reasonable amount of time or if worse. Go to ER if feeling really bad!  Prescriptions: TESSALON PERLES 100 MG CAPS (BENZONATATE) 1-2 by mouth two times a day as needed cogh  #120 x 1   Entered  and Authorized by:   Tresa Garter MD   Signed by:   Tresa Garter MD on 06/24/2010   Method used:   Print then Give to Patient   RxID:   [id number] PROMETHAZINE-CODEINE 6.25-10 MG/5ML SYRP (PROMETHAZINE-CODEINE) 5-10 ml by mouth q id as needed cough  #300 ml x 0   Entered and Authorized by:   Tresa Garter MD   Signed by:   Tresa Garter MD on 06/24/2010   Method used:   Print then Give to Patient   RxID:   [id number] BACTRIM DS 800-160 MG TABS (SULFAMETHOXAZOLE-TRIMETHOPRIM) 1 by mouth bid  #20 x 1   Entered and Authorized by:   Tresa Garter MD   Signed by:   Tresa Garter MD on 06/24/2010   Method used:   Print then Give to Patient   RxID:   [id number]

## 2010-11-04 NOTE — Letter (Signed)
Summary: Generic Letter  [facility]  [address] [place], [place] [address]   Phone: [phone]  Fax: [phone]    06/18/2010  Sejla Stthomas [address] [place], [place]  [address]  Dear Ms. Snuffer,   I have been trying to contact you about a prescription refill request we received on your behalf.  Please contact us at [phone] as soon as possible so we can verify the need for this refill.     Sincerely,   Lanier Prude, [facility])

## 2010-11-06 ENCOUNTER — Encounter: Payer: Self-pay | Admitting: Internal Medicine

## 2010-11-06 NOTE — Progress Notes (Signed)
Summary: Prometh/Codeine Rf  Phone Note Refill Request Message from:  Fax from Pharmacy  Refills Requested: Medication #1:  PROMETHAZINE-CODEINE 6.25-10 MG/5ML SYRP 5-10 ml by mouth q id as needed cough   Dosage confirmed as above?Dosage Confirmed   Supply Requested:   Last Refilled: 07/15/2010  Method Requested: Telephone to Pharmacy Next Appointment Scheduled: 11-18-10 Initial call taken by: Lanier Prude, [facility]),  October 27, 2010 11:59 AM  Follow-up for Phone Call        ok to ref Follow-up by: Tresa Garter MD,  October 27, 2010 6:10 PM  Additional Follow-up for Phone Call Additional follow up Details #1::        Rx faxed to  [facility] @ [phone]. EMR updated Additional Follow-up by: Orlan Leavens RMA,  October 28, 2010 10:59 AM    Prescriptions: PROMETHAZINE-CODEINE 6.25-10 MG/5ML SYRP (PROMETHAZINE-CODEINE) 5-10 ml by mouth q id as needed cough  #300 ml x 0   Entered by:   Orlan Leavens RMA   Authorized by:   Tresa Garter MD   Signed by:   Orlan Leavens RMA on 10/28/2010   Method used:   Printed then faxed to ...       [facility]  [address] [id number] (retail)       [address]       [place], [place]  [address]       Ph: [phone]       Fax: [phone]   RxID:   [id number]

## 2010-11-06 NOTE — Assessment & Plan Note (Signed)
Summary: 6 WK ROV /NWS   Vital Signs:  Patient profile:   65 year old female Height:      63 inches Weight:      215 pounds BMI:     38.22 Temp:     98.8 degrees F oral Pulse rate:   80 / minute Pulse rhythm:   regular Resp:     16 per minute BP sitting:   110 / 76  (left arm) Cuff size:   large  Vitals Entered By: Lanier Prude, CMA(AAMA) (September 16, 2010 11:08 AM) CC: 6 wk f/u  Is Patient Diabetic? Yes   Primary Care Hazley Dezeeuw:  Trinna Post Plotnikov,MD  CC:  6 wk f/u .  History of Present Illness: The patient presents for a follow up of hypertension, diabetes, OA,  low plts, hematuria. No hematuria lately.  Current Medications (verified): 1)  Torsemide 20 Mg Tabs (Torsemide) .... 2 By Mouth Once Daily For Swelling 2)  Spironolactone 100 Mg  Tabs (Spironolactone) .Marland Kitchen.. 1 By Mouth Qd 3)  Lactulose 10 Gm/65ml Soln (Lactulose) .... 30 Cc By Mouth 2-3 Times A Week 4)  Budeprion Sr 150 Mg Tb12 (Bupropion Hcl) .Marland Kitchen.. 1 By Mouth Q Am 5)  Citalopram Hydrobromide 10 Mg  Tabs (Citalopram Hydrobromide) .... Take 1 Tab By Mouth Daily 6)  Ulticare Short Pen Needles 31g X 8 Mm Misc (Insulin Pen Needle) .... Use As Directed 7)  Humalog Pen 100 Unit/ml Soln (Insulin Lispro (Human)) .... 9 To 16 Units Qid 8)  Bd Pen Needle Mini U/f 31g X 5 Mm Misc (Insulin Pen Needle) .... Use As Directed 9)  Pennsaid 1.5 % Soln (Diclofenac Sodium) .... 3-5 Gtt On Skin Three Times A Day For Pain 10)  Promethazine-Codeine 6.25-10 Mg/9ml Syrp (Promethazine-Codeine) .... 5-10 Ml By Mouth Q Id As Needed Cough 11)  Tessalon Perles 100 Mg Caps (Benzonatate) .Marland Kitchen.. 1-2 By Mouth Two Times A Day As Needed Cogh 12)  Align  Caps (Probiotic Product) .Marland Kitchen.. 1 By Mouth Once Daily For Your Intesinal Flora Restoraion 13)  Freestyle Lite   Strp (Glucose Blood) .... Use Bid 14)  Vitamin D3 1000 Unit  Tabs (Cholecalciferol) .Marland Kitchen.. 1 Qd 15)  Freestyle Lancets   Misc (Lancets) .... Two Times A Day Prn 16)  Accu-Chek Compact Test Drum   Strp (Glucose Blood) .... Check Blood Sugar Two Times A Day 17)  Gabapentin 400 Mg Caps (Gabapentin) .Marland Kitchen.. 1 By Mouth Three Times A Day As Needed Pain  Allergies (verified): 1)  ! 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) CONSTIPATION (ICD-564.00) BOWEL OBSTRUCTION (ICD-560.9) WELL ADULT EXAM (ICD-V70.0) ROSACEA (ICD-695.3) CIRRHOSIS (ICD-571.5) DIABETES MELLITUS, TYPE II (ICD-250.00) HYPERTENSION (ICD-401.9) SUBARACHNOID HEMORRHAGE (ICD-430) DEPRESSION (ICD-311) ANEMIA-IRON 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) LOW BACK PAIN (ICD-724.2) HEARING LOSS (ICD-389.9) UTI'S, CHRONIC (ICD-599.0) MORBID OBESITY (ICD-278.01) PANCREATITIS, HX OF (ICD-V12.70) NEPHROLITHIASIS, HX OF (ICD-V13.01) FAMILY HISTORY DIABETES 1ST DEGREE RELATIVE (ICD-V18.0)  Social History: Last updated: 04/02/2009 Occupation: disabled Married - may be separating 2009      still  married Former Smoker Illicit Drug Use - no Got herself a puppy 2010  Review of Systems  The patient denies fever, dyspnea on exertion, abdominal pain, depression, melena, hematuria, incontinence, and muscle weakness.    Physical Exam  General:  NAD overweight-appearing.   Head:  R scull around R eye is tender Ears:  deaf Nose:  External nasal examination shows no deformity or inflammation. R nostril w/dry blood Mouth:  Erythematous throat and intranasal mucosa c/w URI  Lungs:  Clear throughout to auscultation. Heart:  Regular rate and rhythm; no murmurs, rubs or bruits. Abdomen:   obese abdomen with normal active bowel sounds and multiple surgical scars in right upper quadrant and in midline. There was no   tenderness in RUQ. There was nol tenderness in the lower abdomen,  no mass or fullness. Msk:  R knee is not tender w/ROM a 1.5 cm R sole lat callus Extremities:  no edema B Neurologic:  Alert and  oriented x3;  grossly normal neurologically. Skin:  no jaundice Psych:  Alert and cooperative. not suicidal and subdued.     Impression & Recommendations:  Problem # 1:  DIABETES MELLITUS, TYPE II (ICD-250.00) Assessment Unchanged  Her updated medication list for this problem includes:    Humalog Pen 100 Unit/ml Soln (Insulin lispro (human)) .Marland Kitchen... 9 to 16 units qid  Problem # 2:  KNEE PAIN (ICD-719.46) B Assessment: Unchanged F/u w/Ortho if needed  Problem # 3:  HEMATURIA UNSPECIFIED (ICD-599.70) Assessment: Improved  Problem # 4:  ANEMIA, HX OF (ICD-V12.3) Assessment: Improved  Problem # 5:  CIRRHOSIS (ICD-571.5) Assessment: Unchanged The labs were reviewed with the patient.   Complete Medication List: 1)  Torsemide 20 Mg Tabs (Torsemide) .... 2 by mouth once daily for swelling 2)  Spironolactone 100 Mg Tabs (Spironolactone) .Marland Kitchen.. 1 by mouth qd 3)  Lactulose 10 Gm/11ml Soln (Lactulose) .... 30 cc by mouth 2-3 times a week 4)  Budeprion Sr 150 Mg Tb12 (Bupropion hcl) .Marland Kitchen.. 1 by mouth q am 5)  Citalopram Hydrobromide 10 Mg Tabs (Citalopram hydrobromide) .... Take 1 tab by mouth daily 6)  Ulticare Short Pen Needles 31g X 8 Mm Misc (Insulin pen needle) .... Use as directed 7)  Humalog Pen 100 Unit/ml Soln (Insulin lispro (human)) .... 9 to 16 units qid 8)  Bd Pen Needle Mini U/f 31g X 5 Mm Misc (Insulin pen needle) .... Use as directed 9)  Promethazine-codeine 6.25-10 Mg/44ml Syrp (Promethazine-codeine) .... 5-10 ml by mouth q id as needed cough 10)  Tessalon Perles 100 Mg Caps (Benzonatate) .Marland Kitchen.. 1-2 by mouth two times a day as needed cogh 11)  Align Caps (Probiotic product) .Marland Kitchen.. 1 by mouth once daily for your intesinal flora restoraion 12)  Freestyle Lite Strp (Glucose blood) .... Use  bid 13)  Vitamin D3 1000 Unit Tabs (Cholecalciferol) .Marland Kitchen.. 1 qd 14)  Freestyle Lancets Misc (Lancets) .... Two times a day prn 15)  Accu-chek Compact Test Drum Strp (Glucose blood) .... Check blood sugar two times a day 16)  Gabapentin 400 Mg Caps (Gabapentin) .Marland Kitchen.. 1 by mouth three times a day as needed pain 17)  Flector 1.3 % Ptch (Diclofenac epolamine) .... Use 1 two times a day on knee  Patient Instructions: 1)  Please schedule a follow-up appointment in 2 months. 2)  BMP prior to visit, ICD-9: 3)  Hepatic Panel prior to visit, ICD-9: 4)  CBC w/ Diff prior to visit, ICD-9: 5)  INR  995.20  250.00 6)  HbgA1C prior to visit, ICD-9: 7)  Use Flector bid Prescriptions: FLECTOR 1.3 % PTCH (DICLOFENAC EPOLAMINE) use 1 two times a day on knee  #60 x 3   Entered and Authorized by:   Tresa Garter MD  Signed by:   Tresa Garter MD on 09/16/2010   Method used:   Print then Give to Patient   RxID:   [id number]    Orders Added: 1)  Est. Patient Level IV [[address]]

## 2010-11-06 NOTE — Progress Notes (Signed)
Summary: Call Report  Phone Note Other Incoming   Caller: [facility] Summary of Call: [facility] Triage Call Report Triage Record Num: [id number] Operator: Kathleen Lime Patient Name: [name] Call Date & Time: 09/12/2010 5:15:51PM Patient Phone: [phone] PCP: Sonda Primes Patient Gender: Female PCP Fax : [phone] Patient DOB: 1945/11/07 Practice Name: [facility] Reason for Call: Gwynneth Aliment is calling from [facility] regarding a Platelet Count ordered on Earney Navy by Dr Posey Santistevan. Platelets 40,000 Critical result. Dr Ruthe Mannan was given results - no orders received. Protocol(s) Used: PCP Calls, No Triage (Adult) Recommended Outcome per Protocol: Call Provider within 24 Hours Reason for Outcome: Lab calling with test results Care Advice:  ~ 12/ Initial call taken by: Margaret Pyle, CMA,  September 15, 2010 8:20 AM  Follow-up for Phone Call        noted Follow-up by: Tresa Garter MD,  September 15, 2010 5:33 PM

## 2010-11-13 ENCOUNTER — Encounter (INDEPENDENT_AMBULATORY_CARE_PROVIDER_SITE_OTHER): Payer: Self-pay | Admitting: *Deleted

## 2010-11-13 ENCOUNTER — Other Ambulatory Visit: Payer: Self-pay | Admitting: Internal Medicine

## 2010-11-13 ENCOUNTER — Telehealth: Payer: Self-pay | Admitting: Internal Medicine

## 2010-11-13 ENCOUNTER — Other Ambulatory Visit: Payer: Medicare Other

## 2010-11-13 DIAGNOSIS — Z79899 Other long term (current) drug therapy: Secondary | ICD-10-CM

## 2010-11-13 DIAGNOSIS — E119 Type 2 diabetes mellitus without complications: Secondary | ICD-10-CM

## 2010-11-13 LAB — CBC WITH DIFFERENTIAL/PLATELET
Basophils Relative: 0.2 % (ref 0.0–3.0)
Eosinophils Absolute: 0.1 10*3/uL (ref 0.0–0.7)
HCT: 38.7 % (ref 36.0–46.0)
Hemoglobin: 13.3 g/dL (ref 12.0–15.0)
Lymphocytes Relative: 23 % (ref 12.0–46.0)
Lymphs Abs: 0.5 10*3/uL — ABNORMAL LOW (ref 0.7–4.0)
MCHC: 34.3 g/dL (ref 30.0–36.0)
MCV: 91.6 fl (ref 78.0–100.0)
Neutro Abs: 1.4 10*3/uL (ref 1.4–7.7)
RBC: 4.23 Mil/uL (ref 3.87–5.11)
RDW: 16.5 % — ABNORMAL HIGH (ref 11.5–14.6)

## 2010-11-13 LAB — HEPATIC FUNCTION PANEL
Alkaline Phosphatase: 89 U/L (ref 39–117)
Bilirubin, Direct: 0.3 mg/dL (ref 0.0–0.3)
Total Bilirubin: 1 mg/dL (ref 0.3–1.2)
Total Protein: 6.2 g/dL (ref 6.0–8.3)

## 2010-11-13 LAB — BASIC METABOLIC PANEL
BUN: 14 mg/dL (ref 6–23)
Chloride: 103 mEq/L (ref 96–112)
Glucose, Bld: 254 mg/dL — ABNORMAL HIGH (ref 70–99)
Potassium: 3.9 mEq/L (ref 3.5–5.1)

## 2010-11-13 LAB — HEMOGLOBIN A1C: Hgb A1c MFr Bld: 10.1 % — ABNORMAL HIGH (ref 4.6–6.5)

## 2010-11-18 ENCOUNTER — Ambulatory Visit (INDEPENDENT_AMBULATORY_CARE_PROVIDER_SITE_OTHER): Payer: Medicare Other | Admitting: Internal Medicine

## 2010-11-18 ENCOUNTER — Encounter: Payer: Self-pay | Admitting: Internal Medicine

## 2010-11-18 DIAGNOSIS — R319 Hematuria, unspecified: Secondary | ICD-10-CM

## 2010-11-18 DIAGNOSIS — E119 Type 2 diabetes mellitus without complications: Secondary | ICD-10-CM

## 2010-11-18 DIAGNOSIS — R1031 Right lower quadrant pain: Secondary | ICD-10-CM

## 2010-11-18 DIAGNOSIS — R1011 Right upper quadrant pain: Secondary | ICD-10-CM

## 2010-11-20 NOTE — Progress Notes (Signed)
Summary: CRITICAL LAB  Phone Note From Other Clinic   Summary of Call: LAB CALLED earlier w/critical plt count. MD was given verbal report - minor change from last labs.  Initial call taken by: Lamar Sprinkles, CMA,  November 13, 2010 6:08 PM  Follow-up for Phone Call        noted Thank you!  Follow-up by: Tresa Garter MD,  November 14, 2010 8:01 AM

## 2010-11-26 NOTE — Consult Note (Signed)
Summary: [facility]   Imported By: Sherian Rein 11/19/2010 09:33:15  _____________________________________________________________________  External Attachment:    Type:   Image     Comment:   External Document

## 2010-11-26 NOTE — Assessment & Plan Note (Signed)
Summary: 2 MO ROV /NWS  #   Vital Signs:  Patient profile:   65 year old female Height:      63 inches Weight:      222 pounds BMI:     39.47 Temp:     98.4 degrees F oral Pulse rate:   80 / minute Pulse rhythm:   regular Resp:     16 per minute BP sitting:   130 / 80  (left arm) Cuff size:   large  Vitals Entered By: Lanier Prude, CMA(AAMA) (November 18, 2010 1:39 PM) CC: 2 mo f/u  Is Patient Diabetic? Yes   Primary Care Provider:  Trinna Post Connie Lasater,MD  CC:  2 mo f/u .  History of Present Illness: The patient presents for a follow up of hypertension, diabetes, hyperlipidemia and hematuria. No RUQ pain.Marland KitchenMarland KitchenFeeling better  Current Medications (verified): 1)  Torsemide 20 Mg Tabs (Torsemide) .... 2 By Mouth Once Daily For Swelling 2)  Spironolactone 100 Mg  Tabs (Spironolactone) .Marland Kitchen.. 1 By Mouth Qd 3)  Lactulose 10 Gm/57ml Soln (Lactulose) .... 30 Cc By Mouth 2-3 Times A Week 4)  Budeprion Sr 150 Mg Tb12 (Bupropion Hcl) .Marland Kitchen.. 1 By Mouth Q Am 5)  Citalopram Hydrobromide 10 Mg  Tabs (Citalopram Hydrobromide) .... Take 1 Tab By Mouth Daily 6)  Ulticare Short Pen Needles 31g X 8 Mm Misc (Insulin Pen Needle) .... Use As Directed 7)  Humalog Pen 100 Unit/ml Soln (Insulin Lispro (Human)) .... 9 To 16 Units Qid 8)  Bd Pen Needle Mini U/f 31g X 5 Mm Misc (Insulin Pen Needle) .... Use As Directed 9)  Promethazine-Codeine 6.25-10 Mg/100ml Syrp (Promethazine-Codeine) .... 5-10 Ml By Mouth Q Id As Needed Cough 10)  Tessalon Perles 100 Mg Caps (Benzonatate) .Marland Kitchen.. 1-2 By Mouth Two Times A Day As Needed Cogh 11)  Align  Caps (Probiotic Product) .Marland Kitchen.. 1 By Mouth Once Daily For Your Intesinal Flora Restoraion 12)  Freestyle Lite   Strp (Glucose Blood) .... Use Bid 13)  Vitamin D3 1000 Unit  Tabs (Cholecalciferol) .Marland Kitchen.. 1 Qd 14)  Freestyle Lancets   Misc (Lancets) .... Two Times A Day Prn 15)  Accu-Chek Compact Test Drum  Strp (Glucose Blood) .... Check Blood Sugar Two Times A Day 16)  Gabapentin 400 Mg  Caps (Gabapentin) .Marland Kitchen.. 1 By Mouth Three Times A Day As Needed Pain 17)  Flector 1.3 % Ptch (Diclofenac Epolamine) .... Use 1 Two Times A Day On Knee  Allergies (verified): 1)  ! 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) CONSTIPATION (ICD-564.00) BOWEL OBSTRUCTION (ICD-560.9) WELL ADULT EXAM (ICD-V70.0) ROSACEA (ICD-695.3) CIRRHOSIS (ICD-571.5) DIABETES MELLITUS, TYPE II (ICD-250.00) HYPERTENSION (ICD-401.9) SUBARACHNOID HEMORRHAGE (ICD-430) DEPRESSION (ICD-311) ANEMIA-IRON 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) LOW BACK PAIN (ICD-724.2) HEARING LOSS (ICD-389.9) UTI'S, CHRONIC (ICD-599.0) MORBID OBESITY (ICD-278.01) PANCREATITIS, HX OF (ICD-V12.70) NEPHROLITHIASIS, HX OF (ICD-V13.01) FAMILY HISTORY DIABETES 1ST DEGREE RELATIVE (ICD-V18.0)  Social History: Last updated: 04/02/2009 Occupation: disabled Married - may be separating 2009      still  married Former Smoker Illicit Drug Use - no Got herself a puppy 2010  Review of Systems  The patient denies fever, chest pain, syncope, and dyspnea on exertion.    Physical Exam  General:  NAD overweight-appearing.  Nose:  External nasal examination shows no deformity or inflammation. R nostril w/dry blood Mouth:  Erythematous throat and intranasal mucosa c/w URI  Lungs:  Clear throughout to auscultation. Heart:  Regular rate and rhythm; no murmurs, rubs or bruits. Abdomen:   obese abdomen with normal active bowel sounds and multiple surgical scars in right upper quadrant and in midline. There was no  tenderness in RUQ. There was nol tenderness in the lower abdomen,  no mass or fullness. Msk:  R knee is not  tender w/ROM Lumbar-sacral spine is a little  tender to palpation over paraspinal muscles and painfull with the ROM  Extremities:  no edema B Neurologic:  Alert and  oriented x3;  grossly normal neurologically. Skin:  no jaundice Psych:  Alert and cooperative. not suicidal and less subdued.     Impression & Recommendations:  Problem # 1:  HEMATURIA UNSPECIFIED (ICD-599.70) - stopped Assessment Improved S/p neg Urol w/up - urol. notes reviewed  Problem # 2:  EDEMA (ICD-782.3) Assessment: Improved  Her updated medication list for this problem includes:    Torsemide 20 Mg Tabs (Torsemide) .Marland Kitchen... 2 by mouth once daily for swelling    Spironolactone 100 Mg Tabs (Spironolactone) .Marland Kitchen... 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 Discussed compliance w/diet. Her updated medication list for this problem includes:    Humalog Pen 100 Unit/ml Soln (Insulin lispro (human)) .Marland Kitchen... 9 to 16 units qid  Labs Reviewed: Creat: 0.6 (11/13/2010)    Reviewed HgBA1c results: 10.1 (11/13/2010)  9.2 (02/20/2010)  Problem # 5:  RLQ PAIN (ICD-789.03)/RUQ pain - resolved Assessment: Deteriorated  Complete Medication List: 1)  Torsemide 20 Mg Tabs (Torsemide) .... 2 by mouth once daily for swelling 2)  Spironolactone 100 Mg Tabs (Spironolactone) .Marland Kitchen.. 1 by mouth qd 3)  Lactulose 10 Gm/41ml Soln (Lactulose) .... 30 cc by mouth 2-3 times a week 4)  Budeprion Sr 150 Mg Tb12 (Bupropion hcl) .Marland Kitchen.. 1 by mouth q am 5)  Citalopram Hydrobromide 10 Mg Tabs (Citalopram hydrobromide) .... Take 1 tab by mouth daily 6)  Ulticare Short Pen Needles 31g X 8 Mm Misc (Insulin pen needle) .... Use as directed 7)  Humalog Pen 100 Unit/ml Soln (Insulin lispro (human)) .... 9 to 16 units qid 8)  Bd Pen Needle Mini U/f 31g X 5 Mm Misc  (Insulin pen needle) .... Use as directed 9)  Promethazine-codeine 6.25-10 Mg/74ml Syrp (Promethazine-codeine) .... 5-10 ml by mouth q id as needed cough 10)  Tessalon Perles 100 Mg Caps (Benzonatate) .Marland Kitchen.. 1-2 by mouth two times a day as needed cogh 11)  Align Caps (Probiotic product) .Marland Kitchen.. 1 by mouth once daily for your intesinal flora restoraion 12)  Freestyle Lite Strp (Glucose blood) .... Use bid 13)  Vitamin D3 1000 Unit Tabs (Cholecalciferol) .Marland Kitchen.. 1 qd 14)  Freestyle Lancets Misc (Lancets) .... Two times a day prn 15)  Accu-chek Compact Test Drum Strp (Glucose blood) .... Check blood sugar two times a day 16)  Gabapentin 400 Mg Caps (Gabapentin) .Marland Kitchen.. 1 by mouth three times a day as needed pain 17)  Flector 1.3 % Ptch (Diclofenac epolamine) .... Use 1 two times a day on knee  Patient Instructions: 1)  Please schedule a follow-up appointment in 2 months. 2)  BMP prior to visit, ICD-9: 3)  CBC  w/ Diff prior to visit, ICD-9:995.20  287.50 4)  INR Prescriptions: PROMETHAZINE-CODEINE 6.25-10 MG/5ML SYRP (PROMETHAZINE-CODEINE) 5-10 ml by mouth q id as needed cough  #300 ml x 0   Entered and Authorized by:   Tresa Garter MD   Signed by:   Tresa Garter MD on 11/18/2010   Method used:   Print then Give to Patient   RxID:   [id number] SPIRONOLACTONE 100 MG  TABS (SPIRONOLACTONE) 1 by mouth qd  #30 x 6   Entered and Authorized by:   Tresa Garter MD   Signed by:   Tresa Garter MD on 11/18/2010   Method used:   Electronically to        [facility]  [address] [phone]* (retail)       [address]       [place], [place]  [address]       Ph: [phone]       Fax: [phone]   RxID:   [id number] TORSEMIDE 20 MG TABS (TORSEMIDE) 2 by mouth once daily for swelling  #60 x 6   Entered and Authorized by:   Tresa Garter MD   Signed by:   Tresa Garter MD on 11/18/2010   Method used:   Electronically to        [facility]  [address]  [phone]* (retail)       [address]       [place], [place]  [address]       Ph: [phone]       Fax: [phone]   RxID:   [id number]    Orders Added: 1)  Est. Patient Level IV [20254]

## 2010-12-03 ENCOUNTER — Telehealth: Payer: Self-pay | Admitting: Internal Medicine

## 2010-12-11 NOTE — Progress Notes (Signed)
Summary: Prometh/Codeine RF  Phone Note Refill Request Message from:  Fax from Pharmacy  Refills Requested: Medication #1:  PROMETHAZINE-CODEINE 6.25-10 MG/5ML SYRP 5-10 ml by mouth q id as needed cough   Dosage confirmed as above?Dosage Confirmed   Supply Requested:   Last Refilled: 11/18/2010  Method Requested: Telephone to Pharmacy Initial call taken by: Lanier Prude, [facility]),  December 03, 2010 12:04 PM  Follow-up for Phone Call        ok x 1 Follow-up by: Tresa Garter MD,  December 03, 2010 6:08 PM  Additional Follow-up for Phone Call Additional follow up Details #1::        Rx called to pharmacy Additional Follow-up by: Lanier Prude, [facility]),  December 04, 2010 8:33 AM    Prescriptions: PROMETHAZINE-CODEINE 6.25-10 MG/5ML SYRP (PROMETHAZINE-CODEINE) 5-10 ml by mouth q id as needed cough  #300 ml x 0   Entered by:   Lanier Prude, CMA(AAMA)   Authorized by:   Tresa Garter MD   Signed by:   Lanier Prude, CMA(AAMA) on 12/04/2010   Method used:   Telephoned to ...       [facility]  [address] [id number] ([facility]ail[facility]       [address]       [place], [place]  [address]       Ph: [phone]       Fax: [phone]   RxID:   [id number]

## 2010-12-30 ENCOUNTER — Other Ambulatory Visit: Payer: Self-pay | Admitting: Internal Medicine

## 2011-01-06 ENCOUNTER — Other Ambulatory Visit: Payer: Self-pay | Admitting: Internal Medicine

## 2011-01-10 LAB — POCT I-STAT, CHEM 8
BUN: 19 mg/dL (ref 6–23)
Calcium, Ion: 1.2 mmol/L (ref 1.12–1.32)
Chloride: 102 mEq/L (ref 96–112)
Creatinine, Ser: 0.7 mg/dL (ref 0.4–1.2)

## 2011-01-13 ENCOUNTER — Other Ambulatory Visit: Payer: Medicare Other

## 2011-01-13 ENCOUNTER — Other Ambulatory Visit: Payer: Self-pay | Admitting: Internal Medicine

## 2011-01-13 DIAGNOSIS — D696 Thrombocytopenia, unspecified: Secondary | ICD-10-CM

## 2011-01-13 DIAGNOSIS — T887XXA Unspecified adverse effect of drug or medicament, initial encounter: Secondary | ICD-10-CM

## 2011-01-20 ENCOUNTER — Ambulatory Visit: Payer: BC Managed Care – PPO | Admitting: Internal Medicine

## 2011-01-20 LAB — GLUCOSE, CAPILLARY: Glucose-Capillary: 167 mg/dL — ABNORMAL HIGH (ref 70–99)

## 2011-01-26 ENCOUNTER — Encounter: Payer: Self-pay | Admitting: Internal Medicine

## 2011-01-26 ENCOUNTER — Ambulatory Visit (INDEPENDENT_AMBULATORY_CARE_PROVIDER_SITE_OTHER): Payer: Medicare Other | Admitting: Internal Medicine

## 2011-01-26 DIAGNOSIS — M25569 Pain in unspecified knee: Secondary | ICD-10-CM

## 2011-01-26 DIAGNOSIS — R319 Hematuria, unspecified: Secondary | ICD-10-CM

## 2011-01-26 DIAGNOSIS — R1031 Right lower quadrant pain: Secondary | ICD-10-CM

## 2011-01-26 DIAGNOSIS — D696 Thrombocytopenia, unspecified: Secondary | ICD-10-CM

## 2011-01-26 DIAGNOSIS — E119 Type 2 diabetes mellitus without complications: Secondary | ICD-10-CM

## 2011-01-26 DIAGNOSIS — E1165 Type 2 diabetes mellitus with hyperglycemia: Secondary | ICD-10-CM

## 2011-01-26 DIAGNOSIS — K7460 Unspecified cirrhosis of liver: Secondary | ICD-10-CM

## 2011-01-26 MED ORDER — BENZONATATE 100 MG PO CAPS: 100.0000 mg | ORAL_CAPSULE | Freq: Three times a day (TID) | ORAL | Status: DC | PRN

## 2011-01-26 MED ORDER — DICLOFENAC SODIUM 1 % TD GEL: 1.0000 "application " | Freq: Four times a day (QID) | TRANSDERMAL | Status: DC

## 2011-01-26 MED ORDER — GLIMEPIRIDE 4 MG PO TABS: 4.0000 mg | ORAL_TABLET | Freq: Two times a day (BID) | ORAL | Status: DC

## 2011-01-26 MED ORDER — PROMETHAZINE-CODEINE 6.25-10 MG/5ML PO SYRP: 5.0000 mL | ORAL_SOLUTION | Freq: Four times a day (QID) | ORAL | Status: DC | PRN

## 2011-01-26 NOTE — Progress Notes (Signed)
  Subjective:    Patient ID: [name], female    DOB: 13-Mar-1946, 65 y.o.   MRN: [id number]  HPI  The patient presents for a follow-up of  chronic hypertension, chronic dyslipidemia, type 2 diabetes controlled with medicines    Review of Systems  Constitutional: Negative for chills.  HENT: Negative for sneezing.   Respiratory: Negative for cough.   Gastrointestinal: Negative for blood in stool.  Genitourinary: Negative for hematuria and flank pain.  Musculoskeletal: Positive for gait problem. Negative for joint swelling.  Skin: Negative for rash.  Neurological: Negative for numbness.  Psychiatric/Behavioral: Negative for confusion and sleep disturbance. The patient is not nervous/anxious.        Objective:   Physical Exam  Constitutional: She appears well-developed and well-nourished. No distress.       Obese. NAD.  HENT:  Head: Normocephalic.  Right Ear: External ear normal.  Left Ear: External ear normal.  Nose: Nose normal.  Mouth/Throat: Oropharynx is clear and moist.       Deaf  Eyes: Conjunctivae are normal. Pupils are equal, round, and reactive to light. Right eye exhibits no discharge. Left eye exhibits no discharge.  Neck: Normal range of motion. Neck supple. No JVD present. No tracheal deviation present. No thyromegaly present.  Cardiovascular: Normal rate, regular rhythm and normal heart sounds.   Pulmonary/Chest: No stridor. No respiratory distress. She has no wheezes.  Abdominal: Soft. Bowel sounds are normal. She exhibits no distension and no mass. There is no tenderness. There is no rebound and no guarding.  Musculoskeletal: She exhibits no edema and no tenderness.  Lymphadenopathy:    She has no cervical adenopathy.  Neurological: She displays normal reflexes. No cranial nerve deficit. She exhibits normal muscle tone. Coordination normal.  Skin: No rash noted. No erythema.  Psychiatric: She has a normal mood and affect. Her behavior is normal. Judgment and  thought content normal.          Assessment & Plan:  DIABETES MELLITUS, TYPE II On Rx  THROMBOCYTOPENIA Due to liver disease. We are monitoring.  KNEE PAIN Some better. Steroid inj by ortho if needed  RLQ PAIN No recurrence  HEMATURIA UNSPECIFIED Resolved. S/p Urol. w/up

## 2011-01-27 ENCOUNTER — Telehealth: Payer: Self-pay | Admitting: *Deleted

## 2011-01-27 MED ORDER — DICLOFENAC SODIUM 1.5 % TD SOLN: 5.0000 [drp] | Freq: Four times a day (QID) | TRANSDERMAL | Status: DC | PRN

## 2011-01-27 NOTE — Telephone Encounter (Signed)
rec fax stating Voltaren gel 1% is no longer made. Please advise on alternative

## 2011-01-27 NOTE — Telephone Encounter (Signed)
Pennsaid OK - done Thx

## 2011-01-29 NOTE — Telephone Encounter (Signed)
Called pt. No answer °

## 2011-01-29 NOTE — Telephone Encounter (Signed)
Pt informed

## 2011-01-30 ENCOUNTER — Telehealth: Payer: Self-pay | Admitting: Internal Medicine

## 2011-01-30 NOTE — Telephone Encounter (Signed)
PA process completed by CMA for Pennsaid 1.5% Drops  Approval received and faxed to pharmacy.

## 2011-02-01 NOTE — Assessment & Plan Note (Signed)
No recurrence. 

## 2011-02-01 NOTE — Assessment & Plan Note (Signed)
Resolved S/p Urol w/up 

## 2011-02-01 NOTE — Assessment & Plan Note (Signed)
Due to liver disease. We are monitoring.

## 2011-02-01 NOTE — Assessment & Plan Note (Signed)
On Rx 

## 2011-02-01 NOTE — Assessment & Plan Note (Signed)
Some better. Steroid inj by ortho if needed

## 2011-02-05 ENCOUNTER — Telehealth: Payer: Self-pay | Admitting: Internal Medicine

## 2011-02-05 NOTE — Telephone Encounter (Signed)
Error

## 2011-02-17 NOTE — Consult Note (Signed)
NAME:  [name], [name]                  ACCOUNT NO.:  [id number]   MEDICAL RECORD NO.:  [id number]          PATIENT TYPE:  OBV   LOCATION:  4742                         FACILITY:  [facility]   PHYSICIAN:  Luis Abed, MD, FACCDATE OF BIRTH:  05/28/1946   DATE OF CONSULTATION:  DATE OF DISCHARGE:                                 CONSULTATION   The patient was admitted with some chest discomfort.  Her enzymes are  negative.  She appears to be quite stable.  She is improved.  There was  a question of very slight EKG change. We are asked to see the patient to  be sure it is safe for her to go home and be studied with an outpatient  Myoview scan.   The patient had the discomfort while eating breakfast.  There was no  shortness of breath, nausea or vomiting.  This lasted for several hours.  She has received some pain meds that helped.  Nitroglycerin has not  helped.  She also recently was admitted with a fractured ankle.   Patient does have some risk factors for coronary disease.  There is  diabetes.   PAST MEDICAL HISTORY:  For other medical problems, see the complete list  below.   ALLERGIES:  TETRACYCLINE, KETEC, ZITHROMAX.   HOME MEDICATIONS:  Glipizide, Nexium, Neurontin, bupropion, enalapril,  spironolactone, and vitamin D.  Also MSIR.   REVIEW OF SYSTEMS:  At this time, she is not having any fevers or  chills.  She is not having any headaches.  She has no shortness of  breath or chest pain at this time.  She is not having any GU symptoms or  GI symptoms.  Overall, she does feel somewhat weak, and her blood  pressure as the day goes on has been low today, and she is receiving  fluids.  Otherwise, her review of systems is negative.   SOCIAL HISTORY:  The patient lives in [place] with her husband.  She  does not smoke.   FAMILY HISTORY:  There is a family history of coronary disease.   PHYSICAL EXAMINATION:  VITAL SIGNS:  Temperature 97.8, pulse 73 with  respirations 18.  Blood  pressure is 82/48.  GENERAL:  She is receiving fluids.  The patient is hard of hearing.  There is no acute distress.  HEENT:  There is normal extraocular motion.  NECK:  There are no carotid bruits.  There is no jugular venous  distention.  NEUROLOGIC:  Patient is oriented x3.  Her affect is normal.  CARDIAC:  S1 and S2.  There are no clicks or significant murmurs.  LUNGS:  Clear.  She has no respiratory distress.  EXTREMITIES:  The patient is overweight.  She has a walking cast on her  right leg.  ABDOMEN:  Soft.  She has normal bowel sounds.  There are no masses or  bruits.  She has reproducible discomfort over the mid sternum.  She is  very hard of hearing.   Chest CT revealed no pulmonary embolus.   Chest x-ray revealed no acute cardiac process.   EKG  reveals nonspecific ST changes.  There is no diagnostic change.   Hemoglobin is 12.3.  BUN is 14.  Creatinine 0.8.  Potassium 3.8.  Troponins are 0.01, 0.01, 0.01.  Platelet count is reduced at 69,000,  and this is known.   PROBLEMS:  1. History of nonalcoholic cirrhosis.  2. 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  3. Thrombocytopenia that is chronic.  4. Diabetes.  5. History of Crohn's disease.  6. Diabetes.  7. Chronic depression.  8. Hypertension.  9. Hearing loss.  10.Chronic low back pain.  11.History of a subdural hemorrhage in August, 2007.  12.Status post hysterectomy, partial colectomy, breast cyst removal,      and repair of her right tibia-fibula.  13.*Current chest pain*.  At this point, the pain does not appear to      be cardiac.  She is having an outpatient Myoview scan.  Further in-      hospital workup is not needed.  14.Low blood pressure:  She is receiving fluids at this time for her      blood pressure and appears to be stable.      Luis Abed, MD, [facility]  Electronically Signed     JDK/MEDQ  D:  04/15/2007  T:  04/15/2007  Job:  [id number]   cc:   Georgina Quint. Plotnikov, MD
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
## 2011-02-17 NOTE — H&P (Signed)
NAME:  [name], [name]                  ACCOUNT NO.:  [id number]   MEDICAL RECORD NO.:  [id number]          PATIENT TYPE:  INP   LOCATION:  1526                         FACILITY:  [facility]   PHYSICIAN:  Ollen Gross, M.D.    DATE OF BIRTH:  08-22-1946   DATE OF ADMISSION:  02/04/2007  DATE OF DISCHARGE:                              HISTORY & PHYSICAL   CHIEF COMPLAINT:  Right ankle pain.   HISTORY OF PRESENT ILLNESS:  The patient a 65 year old female who  unfortunately fell around 2 p.m. on the date of admission.  She was  brought into the emergency apartment over at the hospital and found to  have a distal tibia and fibular fracture.  Dr. Ollen Gross was on-call  and consulted for the injury.  After further discussing of the situation  with her husband, apparently the patient has had poor urinary output and  voided only small amount of urine and then with a couple of days without  voiding which, in hindsight, probably caused some weakness and  issues  causing the patient to fall before she sustained a very bad ankle  fracture and was admitted to the hospital for surgery.  In preparation  and going to the surgery, however,  she was noted to have severe  hyperkalemia and noted to be in renal failure.  The surgery was  postponed.  She was admitted and medical services were consulted to  assist with medical management of the patient and clearance prior to her  surgery.   ALLERGIES:  TETRACYCLINE causes a rash.   CURRENT MEDICATIONS:  1. Morphine IR 50 mg p.o. t.i.d.  2. Nexium 40 mg daily.  3. Neurontin 100 mg  b.i.d. to q.i.d. p.r.n.  4. Bupropion XL 300 mg daily.  5. Glimepiride 4 mg p.o. b.i.d.  6. Aldactone 100 mg p.o. daily.  7. Welchol 625 mg 2 tablets twice a day.  8. Enalapril 20 mg daily.   PAST MEDICAL HISTORY:  1. Moderate hyperkalemia on admission  2. Acute renal failure likely secondary to #1.  3. 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  4. History  of anemia secondary to probable liver cirrhosis  5. Crohn's disease.  6. Diabetes.  7. Hypertension.  8. Hearing loss.  9. Chronic low back pain.  10.Chronic depression.  11.History of subarachnoid hemorrhage back in August 2007   PAST SURGICAL HISTORY:  1. Hysterectomy.  2. Partial colectomy.  3. Cyst removal from her breast.   SOCIAL HISTORY:  Nonsmoker.  Married. No alcohol.  One son.   FAMILY HISTORY:  Sister deceased age 1 with history of heart attack.  Stroke and phlebitis also run in the family.   REVIEW OF SYSTEMS:  GENERAL:  No fevers, chills or night sweats.  NEURO:  The patient denies any syncopal or presyncopal episodes associated with  her fall.  She states she tripped.  RESPIRATORY:  No shortness of  breath, productive cough or hemoptysis.  CARDIOVASCULAR:  Denies any  chest pain or orthopnea.  GI:  She has had some stomach pain which she  attributes to  her Crohn's disease with some chronic diarrhea.  No  nausea, vomiting.  GU:  No dysuria, hematuria, discharge.  However, she  does give a history of oliguria.  Her husband states she went 2 or 3  days without voiding.  MUSCULOSKELETAL:  Right ankle.   PHYSICAL EXAMINATION:  VITAL SIGNS:  Pulse 100, respirations 18, blood  pressure 116/56.  GENERAL:  The patient a 65 year old white female, well-nourished, well-  developed, mild to moderate stress secondary to ankle pain.  She is  overweight.  She is alert and oriented, difficulty hearing, hearing  loss.  She is able to read lips.  HEENT:  Normocephalic, atraumatic.  Pupils are round and reactive.  Oropharynx clear.  NECK:  Supple.  CHEST:  She does have some end inspiratory wheezing at the bases;  otherwise her chest is clear.  HEART:  Regular rhythm.  No murmurs.  No rubs, thrills, or palpitations.  ABDOMEN:  Round, protuberant abdomen.  Soft, nontender.  RECTAL/BREASTS/GENITALIA:  Not done, not pertinent to present illness.  EXTREMITIES:  Right ankle:  She  does have some obvious swelling in and  about the right ankle.  It is extremely tender to palpation in the  distal tibia and ankle.  Motor function:  She is able to move the toes  slowly on exam.  She does have sensation.   IMPRESSION:  1. Right distal tibia fibula fracture/Pilon fracture.  2. Moderate hyperkalemia.  3. Acute renal failure probably secondary to #2.  4. History of anemia.  5. 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  6. History of anemia secondary to the liver cirrhosis.  7. Crohn's disease.  8. Diabetes.  9. Hypertension.  10.Hearing loss.  11.Chronic low back pain.  12.Chronic depression.  13.History of subarachnoid hemorrhage August 2007   PLAN:  The patient is admitted to [facility].  She was to be  admitted for ORIF of the ankle.  However, due to the severe medical  issues, she is going to be placed at bed rest, medical consult, and will  be operated on later once she is cleared .      Alexzandrew L. Perkins, P.A.C.      Ollen Gross, M.D.  Electronically Signed    ALP/MEDQ  D:  02/11/2007  T:  02/11/2007  Job:  [id number]   cc:   Vikki Ports A. Felicity Coyer, MD  [address] [place], [place] [address]   Georgina Quint. Plotnikov, MD  [address]  [place]  [place] [address]   Hedwig Morton. Juanda Chance, MD  [address]  [place]  [place] [address]

## 2011-02-17 NOTE — Discharge Summary (Signed)
NAME:  [name], [name]                  ACCOUNT NO.:  [id number]   MEDICAL RECORD NO.:  [id number]          PATIENT TYPE:  INP   LOCATION:  1526                         FACILITY:  [facility]   PHYSICIAN:  Ollen Gross, M.D.    DATE OF BIRTH:  28-Dec-1945   DATE OF ADMISSION:  02/04/2007  DATE OF DISCHARGE:  02/11/2007                               DISCHARGE SUMMARY   ADMISSION DIAGNOSES:  1. Right distal tibia/fibular fracture, pilon fracture.  2. Moderate hyperkalemia on admission.  3. Acute renal failure probably secondary to #2.  4. 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  5. History of anemia likely secondary to the liver cirrhosis.  6. Crohn's disease.  7. Diabetes.  8. Hypertension.  9. Hearing loss.  10.Chronic low back pain.  11.Chronic depression.  12.History of subarachnoid hemorrhage in August of 2007.   DISCHARGE DIAGNOSES:  1. Right distal tibia/fibular fracture status post open reduction and      internal fixation of right distal tibia/fibular fracture.  2. Hyperkalemia on admission, resolved.  3. Acute renal failure on admission, resolved.  4. Postoperative blood loss anemia.  5. 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  6. History of anemia likely secondary to the liver cirrhosis.  7. Crohn's disease.  8. Diabetes.  9. Hypertension.  10.Hearing loss.  11.Chronic low back pain.  12.Chronic depression.  13.History of subarachnoid hemorrhage in August of 2007.   PROCEDURE:  On Feb 07, 2007, ORIF of right distal tibia/fibular fracture  by Ollen Gross, M.D., assisted by Alexzandrew L. Perkins, P.A.C. under  general anesthesia.   CONSULTATIONS:  1.  Hospitalist, medicine consult, Valerie A. Felicity Coyer, M.D.  2. Rehabilitation services, Ellwood Dense, M.D.   BRIEF HISTORY:  The patient is a 65 year old female who fell at home on  the date of admission sustaining a distal tibia and fibular fracture.  She unfortunately  presented with acute renal failure and hyperkalemia  and required medical clearance and treatment.  Once she was cleared, she  was taken to surgery.   LABORATORY DATA:  CBC on admission revealed a hemoglobin of 13.0,  hematocrit 37.6, white cell count 6.0.  Serial CBCs were followed  throughout the hospital course.  Preoperative H&H was checked.  Hemoglobin was 11.2 and hematocrit 32.3.  Postoperative CBC revealed a  hemoglobin of 9.3 and hematocrit 26.8, white cell count down to 2.9.  Blood gas taken at time of admission revealed a pH of 7.197, PCO2 of  34.6, bicarb of 13.4, total CO2 of 14, base deficit 14.0.  This was a  venous sample.  PT, PTT on admission was 14.5 and 34, respectively; INR  1.1.  Had multiple BMETs on admission.  Had one done in the ER.  Had two  ISTATs done in the operating room.  The first set of chemistries showed  a low sodium of 127, potassium elevated at greater than 7.5, chloride  101, CO2 15, glucose 206, BUN 90, creatinine 5.54.  Followup confirmed  again low sodium of 126, potassium 7.6, BUN 91, creatinine still  elevated at 4.74.  Did confirm with multiple  levels that she was in  renal failure and hyperkalemia.  She had followup labs on the following  day.  After treatment, her potassium came down to 4.5 and then 3.7, and  even on Feb 06, 2007 potassium dropped a little low to 3.4 requiring  supplementation.  Last BMET on Feb 10, 2007 revealed a sodium of 138,  potassium 3.8, chloride 110, CO2 24, BUN normal at 8, creatinine normal  at 0.6.  Her BUN and creatinine did improve with treatment, and she had  resolution of renal failure.  UA on admission revealed small bilirubin,  few epithelial cells; otherwise, negative.  Followup:  Trace bilirubin,  positive nitrite, moderate leukocyte esterase, 11-20 white cells, rare  epithelial cells, 0-2 red cells.   X-RAY DATA:  Two-view of chest on Feb 04, 2007 revealed no active  cardiopulmonary disease.  Ankles films  revealed distal tibia/fibular  fractures.  Intraoperative C-spot films used.  Postoperative films shows  ORIF of the ankle.   EKG:  She does have an EKG dated June 29, 2006:  Sinus rhythm, low-  voltage QRS.  No significant change since last tracing of April 07, 2006,  confirmed by Dr. Othelia Pulling.  EKG on admission, Feb 04, 2007, revealed  normal sinus rhythm, low-voltage QRS.  Cannot rule out anterior infarct,  age undetermined.  This is confirmed.   HOSPITAL COURSE:  The patient was admitted to [facility].  She  was going to be taken to the operating room, but due to the severe  hyperkalemia and renal failure, it was postponed.  She was moved to the  telemetry floor for monitoring.  Medical services were consulted due to  the hyperkalemia.  She was treated with Kayexalate.  She was started  back on her home medications.  Put on sliding scale for the diabetes and  given morphine IV for pain.  She responded very well to the Kayexalate,  and her potassium improved by the next day down to 3.7.  Her BUN and  creatinine also came down from high levels back down to 6, 6, and 2.04  improvement.  She continued to receive treatment, and the renal failure  resolved and also her hyperkalemia resolved.  In fact, her potassium  came down to 3.4, requiring supplementation with one dose of K-Dur.   By Feb 07, 2007, the renal failure and hyperkalemia resolved, and she was  cleared for surgery.  She was taken to the operating room later that  evening and underwent the above-stated procedure without complication.  The patient tolerated the procedure well.  She was later transferred  back to the medical surgical floor for continued monitoring.  Due to her  liver cirrhosis and issues, she was monitored but did not seem to have  any type of encephalopathies.   She was seen on rounds on day #1.  She was ordered to be strict  nonweightbearing because of the moderate fracture and fixation.  PT  and OT were consulted postoperatively.  She was up bed to chair.  She had a  little bit of intermittent confusion at night.  There was some question  of it being sundowning.  By the morning rounds, she was feeling a little  bit better.   After therapy evaluation, it was felt that the patient would likely  benefit from either skilled nursing facility or rehabilitation.  We did  order a rehabilitation consult.  She was seen on postoperative day #2 by  Dr. Ellwood Dense  who felt that she may be appropriate for an inpatient  rehabilitation stay.  Her electrolytes and renal functions remained  stable postoperatively.  She was slowly progressing, and it was felt  that she would probably need skilled nursing facility.   Continued to be treated and monitored.  Discharge planning sent out [facility],  and on Feb 11, 2007 stated they had several bed offers but were  confirming this.  She was seen in rounds.  No complaints.  She had been  seen on Feb 10, 2007 by medical services.  She had been stable  postoperatively, so they felt that since her medical issues had  stabilized, that she would be ready for rehabilitation or skilled  nursing facility when a bed became available.  There was a possibility  of it being available later today on Feb 11, 2007.  Arrangements were  being.  We are waiting on final bed offers, and she will be transferred  at that time.   DISCHARGE PLAN:  The patient will be transferred to skilled nursing  facility of choice, awaiting final bed offers at the time of dictation.   DISCHARGE MEDICATIONS:  Current medications at time of transfer include:  1. Lexapro 10 mg daily.  2. Wellbutrin 300 mg XL p.o. daily.  3. MSIR 15 mg p.o. t.i.d.  Please hold if any sedation.  4. Amaryl 4 mg p.o. b.i.d.  5. Nexium 40 mg p.o. daily.  6. She is currently receiving Lovenox 40 mg subcutaneous injection.      She needs to receive this for another four days.  May discontinue      on Feb 16, 2007.  7. Ferrous gluconate 325 mg p.o. b.i.d.  8. Glucophage 500 mg p.o. b.i.d.  9. Lactulose 30 mg p.o. daily.  10.Ativan 1 mg p.o. b.i.d. p.r.n. agitation.  11.Robaxin 500 mg p.o. q.6h. p.r.n.  12.Hydrocodone 5 mg one or two every four to six hours as needed for      mild pain.   DIET:  Modified carb diet.   ACTIVITY:  She is strict nonweightbearing to the right lower extremity.  Please elevate foot on pillows when not up ambulating.  Ice packs for  pain and swelling.  Please keep the splint clean and dry.  Do not get  wet.  She may be up out of bed.  She needs to be up out of bed a minimum  of b.i.d.  Physical therapy and occupational therapy for gait training,  ambulation, and ADLs but please maintain strict nonweightbearing.  Do  not advance her weightbearing status.   FOLLOW UP:  She needs to follow up in the office with Dr. Lequita Halt at the  [facility] office at [facility].  She will need  to follow up in two weeks from surgery.  Please contact the office at [phone] to arrange appointment time and transfer this patient to  [facility] office with Dr. Lequita Halt.   DISPOSITION:  Pending at the time of this dictation, awaiting final bed  offers.   CONDITION ON DISCHARGE:  Medically stable, slow orthopedic improvement.      Alexzandrew L. Perkins, P.A.C.      Ollen Gross, M.D.  Electronically Signed    ALP/MEDQ  D:  02/11/2007  T:  02/11/2007  Job:  [id number]   cc:   Ollen Gross, M.D.  Fax: [phone]   Georgina Quint. Plotnikov, MD  [address]  [place]  [place] [address]   Vikki Ports  Edsel Petrin, MD  [address] [place], [place] [address]   Hedwig Morton. Juanda Chance, MD  [address]  [place]  [place] [address]   Nursing Facility   Ellwood Dense, M.D.  Fax: [phone]

## 2011-02-17 NOTE — Op Note (Signed)
NAME:  [name], [name]                  ACCOUNT NO.:  [id number]   MEDICAL RECORD NO.:  [id number]          PATIENT TYPE:  INP   LOCATION:  1406                         FACILITY:  [facility]   PHYSICIAN:  Ollen Gross, M.D.    DATE OF BIRTH:  August 21, 1946   DATE OF PROCEDURE:  02/07/2007  DATE OF DISCHARGE:                               OPERATIVE REPORT   PREOPERATIVE DIAGNOSIS:  Right distal tibia-fibula fracture.   POSTOPERATIVE DIAGNOSIS:  Right distal tibia-fibula fracture.   PROCEDURE:  Open reduction and internal fixation right distal tibia-  fibula fracture.   SURGEON:  Ollen Gross, M.D.   ASSISTANT:  Avel Peace, PA-C   ANESTHESIA:  General.   ESTIMATED BLOOD LOSS:  Minimal.   DRAIN:  None.   TOURNIQUET TIME:  42 minutes at 300 mmHg.   COMPLICATIONS:  None.   CONDITION:  Good, stable to recovery.   CLINICAL NOTE:  The patient is a 65 year old female who had a fall at  home three days ago, sustaining a distal lateral malleolar fracture, as  well as a distal tibia intraarticular fracture.  She presents now for  open reduction and internal fixation.  She has been cleared medically.   PROCEDURE IN DETAIL:  After the successful administration of general  anesthetic, tourniquet was placed high on the right thigh.  The right  lower extremity was prepped and draped in the usual sterile fashion.  Extremity was wrapped in Esmarch and tourniquet was inflated to 300  mmHg.  Incision was made over the fibula.  Skin cut was taken through  subcutaneous tissue to the periosteum.  Fracture site identified and  anatomically reduced.  A one-third tubular 7-hole plate was then placed,  three screws proximal to the fracture, cortical screws placed into it  and three distal to the fracture, one cortical and two cancellous.  Radiographs confirmed anatomic reduction.  This actually led to  restoration of the joint line of the tibia with near-anatomic reduction  of the tibia.  There was one  main fracture line obliquely from the  proximal lateral to distal central.  Given her diabetes, liver disease,  and overall poor health, I decided I did not want to do a major  dissection medially and put a plate and screws.  I felt that with the  excellent reduction now, we could just hold it fixed with percutaneous  screws.  We thus placed two 4.5 cannulated guide pins percutaneously,  starting from proximal medial to distal lateral, going perpendicular to  the fracture line.  These were 50 and 55 mm in length and we placed the  screws which effectively closed down the fracture line, leading to  anatomic reduction.  There was still a tiny fracture line at the medial  malleolus; and, I then percutaneously passed the guide pin to the tip of  the malleolus, coursing up into the tibia, and placed a 50-mm partially  threaded screw.  This effectively completely anatomically reduced the  joint which we fluoroscoped in multiple views.  We took AP, mortis,  lateral and showed excellent reduction.  At  this time, we released the  tourniquet with a total time of 42 minutes.  We irrigated, closed subcu  with interrupted 2-0 Vicryl and skin with staples.  Posterior splint was  placed and she was then awakened and transported to recovery in stable  condition.      Ollen Gross, M.D.  Electronically Signed     FA/MEDQ  D:  02/07/2007  T:  02/08/2007  Job:  [id number]

## 2011-02-17 NOTE — Discharge Summary (Signed)
NAME:  [name], [name]                  ACCOUNT NO.:  [id number]   MEDICAL RECORD NO.:  [id number]          PATIENT TYPE:  OBV   LOCATION:  4742                         FACILITY:  [facility]   PHYSICIAN:  Willow Ora, MD           DATE OF BIRTH:  May 16, 1946   DATE OF ADMISSION:  04/14/2007  DATE OF DISCHARGE:  04/17/2007                               DISCHARGE SUMMARY   ADDENDUM:  In the afternoon of April 15, 2007 the patient was reevaluated  before discharge.  She had a blood pressure of 90/50.  Her EKG was  noticed to be slightly different from previous.  Cardiology was  consulted and they felt that the patient was stable to go home once her  blood pressure was better and to have an outpatient Myoview.   The patient received IV fluids and on July 12 she continued to be  hypotensive, but asymptomatic.  That night she had nocturnal chest pain.  The patient reported a great relief of the chest pain with a GI  cocktail.  With this in mind, we increased the Nexium to twice a day.   On today's rounds she feels better.  We have held her enalapril and  spironolactone today; and her blood pressure is coming up to 104/68.  At  this point, she feels ready to go home; and, I believe, she got maximum  hospital benefit.   NEW DISCHARGE INSTRUCTIONS ARE AS FOLLOWS.:  1. Continue with aspirin  2. Lopressor 12.5 one p.o. b.i.d. to be restarted on April 18, 2007.  3. Amaryl 4 mg twice a day.  4. Nexium 40 mg new dose twice a day.  5. Neurontin 100 mg twice a day.  6. MSIR 50 mg three times daily.  7. Wellbutrin XL 300 one p.o. daily.  8. Aldactone 100 mg one p.o. daily to be restarted on April 19, 2007.  9. Enalapril 10 one p.o. daily to be restarted also on April 19, 2007.  10.She is advised to call her primary care doctor, Dr. Posey Nobile,      tomorrow, and ask for an office visit next week as her blood      pressure needs to be checked.  We need to be sure that she follows      through with her outpatient  Myoview.  11.She is also advised to go to the ER if she has chest pain or      dizziness.  Prescription for enalapril was issued as well as a      prescription for Nexium one p.o. b.i.d.Willow Ora, MD  Electronically Signed     JP/MEDQ  D:  04/17/2007  T:  04/18/2007  Job:  [id number]   cc:   Georgina Quint. Plotnikov, MD

## 2011-02-20 NOTE — Discharge Summary (Signed)
NAME:  [name], [name]                  ACCOUNT NO.:  [id number]   MEDICAL RECORD NO.:  [id number]          PATIENT TYPE:  INP   LOCATION:  5712                         FACILITY:  [facility]   PHYSICIAN:  Rene Paci, M.D. LHCDATE OF BIRTH:  1946/09/05   DATE OF ADMISSION:  05/26/2005  DATE OF DISCHARGE:  05/28/2005                                 DISCHARGE SUMMARY   DISCHARGE DIAGNOSES:  1.  Hepatic insufficiency/NASH cirrhosis.  2.  Failure to thrive.  3.  Acute on chronic abdominal pain.   HISTORY OF PRESENT ILLNESS:  Patient is a 65 year old female who presented  to her primary care physician's office with complaints of leg swelling, pain  in her legs and back and being unable to walk.   PAST MEDICAL HISTORY:  1.  NASH cirrhosis.  2.  Duodenal ulcers per endoscopy 2006.  3.  Acute renal failure.  4.  Pancytopenia.  5.  Diabetes type 2.  6.  History of Crohn's disease with chronic abdominal pain.   HOSPITAL COURSE:  HEPATIC INSUFFICIENCY/NASH CIRRHOSIS:  Patient was  admitted and ammonia level was obtained which was equal to 26.  Patient  refused lactulose.  CT of the abdomen and pelvis was performed which showed  stable appearance of the anterior abdominal wall fluid collection, cirrhosis  with splenomegaly and slight interval increase in inflammatory and edematous  changes in the mesenteric fat just deep to the anterior abdominal wall.   Patient was given IV morphine p.r.n. breakthrough pain.  It was felt that  her depression seemed to be contributing to her overall failure to thrive  and also probably contributing to severity of chronic pain.  Patient was  maintained on her Wellbutrin.   Patient had a urinalysis which was positive for urinary tract infection and  patient was given a seven-day treatment of Cipro.   DISCHARGE MEDICATIONS:  1.  Phenergan 25 mg p.o. q.8h. p.r.n.  2.  Glimepiride 1 mg p.o. daily.  3.  Nexium 40 mg p.o. daily.  4.  Wellbutrin 150 mg p.o.  daily.  5.  Cipro 250 mg p.o. b.i.d. for three days.  6.  Lactulose 30 mL daily.  7.  Morphine 15 mg three times daily as needed.  8.  Enalapril 10 mg p.o. daily.   DISCHARGE LABORATORY DATA:  Hemoglobin 10.3, hematocrit 29.7, white blood  cell count 3.4, platelets 83.  BUN 14, creatinine 0.9.   FOLLOW UP:  At the time of discharge, patient was scheduled for a follow-up  appointment with Dr. Georgina Quint. Plotnikov on June 09, 2005, at 3:45  p.m.      Melissa S. Peggyann Juba, NP      Rene Paci, M.D. [facility]  Electronically Signed    MSO/MEDQ  D:  07/27/2005  T:  07/28/2005  Job:  [id number]

## 2011-02-20 NOTE — Discharge Summary (Signed)
   NAME:  [name], [name]                            ACCOUNT NO.:  [id number]   MEDICAL RECORD NO.:  [id number]                   PATIENT TYPE:  INP   LOCATION:  0354                                 FACILITY:  [facility]   PHYSICIAN:  Lorre Munroe., M.D.            DATE OF BIRTH:  1946-03-17   DATE OF ADMISSION:  03/14/2003  DATE OF DISCHARGE:  03/21/2003                                 DISCHARGE SUMMARY   HISTORY:  This is a 65 year old diabetic white female who has had a great  deal of trouble with incisional hernia and complications secondary to that.  She, at the time of this admission, had a chronic fluid collection of the  abdominal wall which was not evidently infected but was bothering her a good  bit.  See the previous records for greater detail.   HOSPITAL COURSE:  On March 14, 2003, the patient underwent evacuation and  drainage of a fluid collection of the abdominal wall.  It did not appear to  be infected at the time of drainage.  The cavity was thoroughly cauterized  and was drained with suction drainage.  She had a lot of pain but did not  develop any overt signs of infection.  It did grow out MRSA and the patient  had had that in the past but she was treated with vancomycin.  The patient  made slow improvement.  The wound healed up primarily.  She was sent home  with her drain in place to be followed up in the office.  She had had no  fever for 36 hours prior to discharge.  Arrangements for a follow up are  made.   DIAGNOSES:  1. Chronic infection and fluid collection of the abdominal wall.  2. Diabetes mellitus.  3. Gastroesophageal reflux disease.  4. Chronic thrombocytopenia.   OPERATION:  Evacuation and drainage of abdominal wall fluid collection.   DISCHARGE CONDITION:  Improved.                                               Lorre Munroe., M.D.    WB/MEDQ  D:  04/23/2003  T:  04/23/2003  Job:  [id number]

## 2011-02-20 NOTE — Discharge Summary (Signed)
NAME:  [name], [name]                            ACCOUNT NO.:  [id number]   MEDICAL RECORD NO.:  [id number]                   PATIENT TYPE:  INP   LOCATION:  5704                                 FACILITY:  [facility]   PHYSICIAN:  Georgina Quint. Plotnikov, M.D. [facility]      DATE OF BIRTH:  Apr 26, 1946   DATE OF ADMISSION:  04/29/2004  DATE OF DISCHARGE:  05/05/2004                                 DISCHARGE SUMMARY   DISCHARGE DIAGNOSES:  1.  Right 6- to -7-mm distal ureteral stone.  2.  Urinary tract infection.  3.  Pruritus.  4.  Cirrhosis.  5.  Pancytopenia.   BRIEF ADMISSION HISTORY:  Ms. Forbush is a 65 year old white female who  presented with abdominal pain.   PAST MEDICAL HISTORY:  1.  Crohn disease.  2.  Cirrhosis secondary to NASH.  3.  Antral gastritis by endoscopy in May 2005.  4.  Normal colonoscopy in February 2005.  5.  Anemia.  6.  Pancytopenia.  7.  Adult onset diabetes mellitus.  8.  Chronic back pain.  9.  History of left knee surgery.  10. Inguinal hernia repair, complicated by a seroma and MRSA, requiring      removal of mesh.  11. Gastroesophageal reflux disease.   HOSPITAL COURSE:  1.  ID.  The patient presented with fever.  The patient was leukopenic.      Urinalysis was consistent with a urinary tract infection.  The patient      was treated with antibiotics.  We suspect she had a recurrent UTI      secondary to nephrolithiasis.  2.  Urology.  The patient did have nephrolithiasis.  She was seen in      consultation by Dr. Isabel Caprice.  He recommended a cysto with utero and laser      lithotripsy and double-J stent placement.  This was performed on May 02, 2004.  She did improve from a urology standpoint.  The patient was      stable for discharge, except for development of pruritus which was felt      to be a drug reaction.  As her pruritus resolved, the patient was stable      for discharge and was discharged on May 05, 2004.   LABS AT DISCHARGE:  White count  2.1, hemoglobin 9.3, platelet count 69.  Stool for occult blood was negative.  LFTs are normal.  HCV RNA was  negative.  HCV RNA quantitative negative.  Hepatitis C antibody was  negative.   MEDICATIONS AT DISCHARGE:  1.  Nexium 40 mg b.i.d.  2.  Celexa 40 mg one half tab every day.  3.  Vasotec 20 mg b.i.d.  4.  Glucophage 500 mg b.i.d.  5.  Glyburide 2.5 mg t.i.d.  6.  Oxycodone p.r.n.  7.  Cipro 250 mg every day for ten days.  8.  Urised one tablet four times  daily p.r.n.   FOLLOW UP:  Follow up with Dr. Posey Mccormack in 1-2 weeks.      Cornell Barman, P.A. [facility]                  Aleksei V. Plotnikov, M.D. [facility]    LC/MEDQ  D:  06/06/2004  T:  06/07/2004  Job:  [id number]   cc:   Valetta Fuller, M.D.  [address]  [place]  [place] [address]  Fax: [phone]   Georgina Quint. Plotnikov, M.D. [facility]

## 2011-02-20 NOTE — H&P (Signed)
NAME:  [name], [name]                            ACCOUNT NO.:  [id number]   MEDICAL RECORD NO.:  [id number]                   PATIENT TYPE:  INP   LOCATION:  3734                                 FACILITY:  [facility]   PHYSICIAN:  Madolyn Frieze. Jens Som, M.D. [facility]         DATE OF BIRTH:  1946/03/11   DATE OF ADMISSION:  06/06/2002  DATE OF DISCHARGE:                                HISTORY & PHYSICAL   HISTORY OF PRESENT ILLNESS:  The patient is a 65 year old female with a past  medical history of diabetes mellitus, hypertension, and Crohn's disease with  multiple surgeries, who we are asked to evaluate for chest pain.  The  patient apparently had a catheterization approximately six years ago for  which the records are not available.  However, there apparently was no  significant coronary disease.  The patient states that she developed chest  pain last p.m.  It was substernal and radiated to her shoulders and up to  her head; it also radiated to her back.  There was associated nausea and  vomiting as well as shortness of breath but no diaphoresis.  The pain  increases with inspiration and also with certain movements.  It improves  with standing up and walking.  Because of these symptoms, we were asked to  further evaluate.  Of note, she does have some dyspnea on exertion but there  is no orthopnea or PND.  She does occasionally have pedal edema.   PRESENT MEDICATIONS:  1. Glucophage.  2. Glyburide.  3. Prevacid 30 mg p.o. b.i.d.  4. Vasotec 20 mg p.o. b.i.d.  5. Celexa 40 mg p.o. q.d.  6. Intravenous vancomycin.   ALLERGIES:  She is allergic to TETRACYCLINE.   SOCIAL HISTORY:  She does not smoke nor does she consume alcohol.   FAMILY HISTORY:  Her family history is positive for coronary artery disease.   PAST MEDICAL HISTORY:  Her past medical history is significant for diabetes  mellitus and hypertension but she denies any hyperlipidemia.  She has  Crohn's disease and has had multiple  abdominal surgeries.  She also has  gastroesophageal reflux disease and a history of depression.  She has had  prior hysterectomy, cholecystectomy, tonsillectomy and multiple surgeries  for Crohn's disease.  She has also had hernia repairs.   REVIEW OF SYSTEMS:  She denies any headaches or fever or chills.  There is  no productive cough or hemoptysis.  There is no dysphagia, odynophagia,  melena or hematochezia.  There is no dysuria or hematuria.  There is no rash  or seizure activity.  There is no orthopnea or PND but she has had pedal  edema.  The remaining systems are negative.   PHYSICAL EXAMINATION:  VITAL SIGNS:  Her physical exam today shows a blood  pressure of 147/71 and the pulse is 87.  Her respiratory rate is 20.  GENERAL:  She is well-developed and  well-nourished, in no acute distress.  SKIN:  Her skin is warm and dry.  HEENT:  Her HEENT is unremarkable with normal eyelids.  NECK:  Her neck is supple with a normal upstroke bilaterally and there are  no bruits noted.  There is no jugular venous distention or thyromegaly  noted.  CHEST:  The chest is clear to auscultation with normal expansion.  CARDIOVASCULAR:  Her cardiovascular exam reveals a regular rate and rhythm.  There is a 2/6 systolic murmur at the left sternal border.  ABDOMEN:  Her abdominal exam shows mild diffuse tenderness.  There are  positive bowel sounds.  There is a surgical dressing in place in the lower  abdomen from her previous infection.  There is no hepatosplenomegaly noted.  She has 2+ femoral pulses bilaterally and no bruits.  EXTREMITIES:  Her extremities show no edema.  She has 2+ dorsalis pedis  pulses bilaterally.  NEUROLOGIC:  Exam is grossly intact.   LABORATORY AND ACCESSORY CLINICAL DATA:  Her electrocardiogram shows normal  sinus rhythm with nonspecific ST changes.   Her chest x-ray shows no acute disease.   Her laboratories show a sodium of 141 with potassium of 3.2.  Her chloride   is 113.  Her BUN and creatinine are 9 and 0.7.  Her albumin is decreased at  2.5.  Her SGOT is elevated at 58.  Her CK is 27.  Her white blood cell count  is 3.1 with a hemoglobin of 9.6 and hematocrit of 30.5.  Her platelet count  is 85,000.   DIAGNOSES:  1. Atypical chest pain.  2. Crohn's disease.  3. Diabetes mellitus.  4. Hypertension.  5. Pancytopenia.  6. Mildly elevated SGOT.   PLAN:  The patient presents for evaluation of chest pain.  Her symptoms are  extremely atypical and seem most consistent with musculoskeletal etiology.  We will admit and rule out myocardial infarction with serial enzymes.  We  will add enteric-coated aspirin 81 mg p.o. q.d.  If her enzymes are  negative, she will be discharged and we will perform an outpatient  Cardiolite for risk stratification.  She also has a pancytopenia.  We will  need to discuss this with primary care to see if this has been documented  previously and if not, she will need a hematology consult.  We will make  further recommendations once we have the above information.                                                Madolyn Frieze Jens Som, M.D. [facility]    BSC/MEDQ  D:  06/06/2002  T:  06/07/2002  Job:  [id number]

## 2011-02-20 NOTE — Consult Note (Signed)
NAME:  [name], [name]                            ACCOUNT NO.:  [id number]   MEDICAL RECORD NO.:  [id number]                   PATIENT TYPE:  EMS   LOCATION:  MAJO                                 FACILITY:  [facility]   PHYSICIAN:  Maretta Bees. Vonita Moss, M.D.             DATE OF BIRTH:  12-27-1945   DATE OF CONSULTATION:  05/18/2004  DATE OF DISCHARGE:                                   CONSULTATION   REFERRING PHYSICIAN:  Dr. Sheppard Penton. Stac[place].   REASON FOR CONSULTATION:  I was asked to see this lady for ER consultation  by Dr. Mariel Aloe.  She came in today with significant right flank pain  and some persistent hematuria the last few days.  Her urologic history goes  back to last month, when she had right flank pain and was hospitalized at  [facility] for pain from a 5-mm stone in the distal right ureter.  Dr.  Valetta Fuller saw her in consultation and did an intra-hospital transfer  to perform a cystoscopy, holmium laser and removal of the stone on May 02, 2004 and he left up a double J catheter.  She apparently missed her  appointment last week to have her double J catheter removed.  The CT scan  today showed right hydronephrosis down to a stent that was displaced  downward with the proximal end in the distal ureter and the rest coiled in  the bladder.  There were no stones in the right urinary tract and there were  some non-obstructing stones in the left kidney.   PAST MEDICAL HISTORY:  Her past medical history includes:  1. 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  2. History of Crohn's disease.  3. Type 2 diabetes.  4. Hypertension.  5. Depression.   MEDICATIONS:  Her usual medications include Nexium, Celexa, Vasotec,  Glucophage, glyburide and oxycodone.  The oxycodone was for pain relief from  her stones.   ALLERGIES:  Allergies to drugs include TETRACYCLINE.   HABITS:  She does not smoke.   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   PHYSICAL EXAMINATION:  GENERAL:  On examination, she is a pleasant white  female in no acute distress, after having received pain medication.  She is  alert and oriented.  Skin is warm and dry.  She is hard of hearing but reads  lips quite well.  ABDOMEN:  The abdomen is soft and nontender.   ASSESSMENT:  I believe that the stent is causing the persistent hematuria  and right flank pain and needs to be removed.   CYSTOSCOPY AND STENT REMOVAL:  Cystoscopy and stent removal were performed  after initially using a flexible scope, but the grasper was not big enough  to pull a double J catheter out, therefore, I used a rigid cystoscope and a  more substantial grasper to successfully remove  her double J catheter.  She  received IV Cipro.   IMPRESSION:  Postoperative ureteroscopic stone manipulation and displaced  right double J catheter.   PLAN:  She will be sent home on oxycodone for p.r.n. pain use.  She was  advised that she might have bleeding or flank pain for another day.  She was  instructed to see Dr. Isabel Caprice in followup in 1-2 weeks.                                               Maretta Bees. Vonita Moss, M.D.    LJP/MEDQ  D:  05/18/2004  T:  05/19/2004  Job:  [id number]   cc:   Georgina Quint. Plotnikov, M.D. [facility]

## 2011-02-20 NOTE — Op Note (Signed)
   NAME:  [name], [name]                            ACCOUNT NO.:  [id number]   MEDICAL RECORD NO.:  [id number]                   PATIENT TYPE:  OIB   LOCATION:  2550                                 FACILITY:  [facility]   PHYSICIAN:  Lorre Munroe., MD              DATE OF BIRTH:  01-Aug-1946   DATE OF PROCEDURE:  08/04/2002  DATE OF DISCHARGE:                                 OPERATIVE REPORT   PREOPERATIVE DIAGNOSES:  Infected prosthetic mesh, abdominal wall.   POSTOPERATIVE DIAGNOSES:  Infected prosthetic mesh, abdominal wall.   OPERATION PERFORMED:  Excision of infected mesh.   SURGEON:  Lebron Conners, MD   ANESTHESIA:  General.   DESCRIPTION OF PROCEDURE:  After the patient was monitored and anesthetized  and had routine preparation and draping of the abdomen, I opened the  abdominal wound to the extent that I could feel the subcutaneous cavity  overlying the infected mesh.  After retracting the tissues, I had a good  view of the mesh.  There was some suture holding it together in the midline  where it had been previously opened and sewed back together in order to  debrided infected tissue underneath it.  I removed that suture and then  removed the loose portions of the mesh cutting back to the areas where the  mesh was fully granulated and incorporated into the abdominal wall.  I did  not at any point find any of the suture which I had used to sew the mesh  into the anterior abdominal wall, that evidently being well incorporated  into the healing tissue.  The granulations themselves appeared pretty  healthy but there was some purulence around the loose portions of the mesh.  After debriding all the unincorporated mesh and sending portions of it for  culture, I irrigated the area, removed the irrigant and got good hemostasis  with the Bovie.  I packed the cavity open with moist gauze and applied a  bulky bandage.  The patient tolerated the operation well. Blood loss was  minimal.                                                 Lorre Munroe., MD    WB/MEDQ  D:  08/04/2002  T:  08/04/2002  Job:  [id number]

## 2011-02-20 NOTE — Op Note (Signed)
NAME:  [name], [name]                            ACCOUNT NO.:  [id number]   MEDICAL RECORD NO.:  [id number]                   PATIENT TYPE:  AMB   LOCATION:  DAY                                  [facility]ACILITY:  [facility]   PHYSICIAN:  Madlyn Frankel. Charlann Boxer, M.D.               DATE OF BIRTH:  Aug 21, 1946   DATE OF PROCEDURE:  08/13/2003  DATE OF DISCHARGE:                                 OPERATIVE REPORT   PREOPERATIVE DIAGNOSIS:  Left knee, medial meniscal tear.   POSTOPERATIVE DIAGNOSES:  1. Left knee, medial meniscal tear.  2. Degenerative changes to the posterior horn of the meniscus.  3. Tricompartmental degenerative changes.   OPERATIVE FINDINGS:  1. Grade 2 changes to the patella.  2. Grade 3-4 changes to the trochlea.  3. Grade 2-3 changes to the medial femoral condyle.  4. Grade 2 changes to lateral femoral condyle.  5. Abundant inflamed hypertrophic synovium anteriorly.   OPERATION PERFORMED:  Left knee diagnostic and operative arthroscopy with  tricompartmental abrasion chondroplasty and partial medial meniscectomy with  anterior synovectomy.   SURGEON:  Madlyn Frankel. Charlann Boxer, M.D.   ASSISTANT:  None.   ANESTHESIA:  General.   ESTIMATED BLOOD LOSS:  None.   COMPLICATIONS:  None.   DISPOSITION:  Stable to recovery room, extubated.   INDICATIONS FOR PROCEDURE:  [name] is a very pleasant 65 year old female  who was initially seen and evaluated by Dr. Lestine Box in clinic.  She was  noted to have medial jointline tenderness.  MRI was ordered which confirmed  degenerative change of the posterior medial horn without inferior tear.  The  patient was also noted to have arthritic changes.  After discussing the  risks and benefits of the procedure, she was consented for left knee  arthroscopy.   DESCRIPTION OF PROCEDURE:  The patient was brought to the operating theater.  Once adequate anesthesia and preoperative antibiotics were administered, the  patient was positioned supine on the  operating table.  The left knee was  then placed in a leg holder and then prepped and draped in sterile fashion.  Standard inferolateral, superolateral and inferomedial portals were created.  Diagnostic evaluation of the knee was carried out with evaluation of the  medial meniscus in particular.  This was probed and found to degenerative  with an undersurface tear.  This was debrided using a biting basket and  oscillating shaver.  The remainder of her __________ knee resulted  degenerative changes.  She was found to have extensive degenerative changes  to the medial compartment as well as the patellofemoral compartment.  The  lateral compartment was relatively intact with intact meniscus and grade 2  changes to the lateral femoral condyle.  She was also noted to have abundant  amount of synovium anteriorly which was debrided using an oscillating  shaver.  Following the procedure, the instruments were removed.  The knee  injected  with Marcaine with epinephrine and portals sites were  reapproximated using 3-0 nylon.  The knee was then cleaned, dried, and  dressed sterilely with a bulky Jones dressing.  The patient was then  transferred to the recovery room in stable condition.                                               Madlyn Frankel Charlann Boxer, M.D.    MDO/MEDQ  D:  08/13/2003  T:  08/13/2003  Job:  [id number]

## 2011-02-20 NOTE — Discharge Summary (Signed)
[place]  Patient:    [name], [name] Visit Number: [id number] MRN: [id number]          Service Type: MED Location: [phone] Attending Physician:  Meredith Leeds Dictated by:   Zigmund Daniel, M.D. Admit Date:  03/13/2002 Discharge Date: 03/17/2002                             Discharge Summary  HISTORY OF PRESENT ILLNESS:  The patient is a 65 year old white female who has undergone repair of incisional hernia using mesh.  This had become infected and had recurring fluid collections and she was brought to the hospital for drainage and possible mesh excision.  The patient is diabetic.  She has not had a lot of trouble with hyperglycemia.  See the history and physical for further details.  HOSPITAL COURSE:  On the day of admission, the patient underwent incision and drainage of recurrent seroma and abscess with debridement of fascia of the abdominal wall.  Drains were employed and postoperatively she remained stable and did quite well.  Notably, there was no evidence of any enterocutaneous fistula in the wound.  She developed no signs of wound infection.  She was discharged on antibiotics with her drains.  The patient was quite anemic in the postoperative period and was transfused blood.  She had a peripherally inserted central venous catheter placed prior to discharge for outpatient vancomycin treatment since she was growing out MRSA.  She is to be followed up shortterm in the office.  DISCHARGE DIAGNOSIS:  Recurrent seroma and infection in abdominal wound.  PROCEDURE:  Debridement and drainage.  CONDITION ON DISCHARGE:  Improved. Dictated by:   Zigmund Daniel, M.D. Attending Physician:  Meredith Leeds DD:  04/04/02 TD:  04/06/02 Job: [id number]

## 2011-02-20 NOTE — Discharge Summary (Signed)
NAME:  [name], [name]                  ACCOUNT NO.:  [id number]   MEDICAL RECORD NO.:  [id number]          PATIENT TYPE:  INP   LOCATION:  4736                         FACILITY:  [facility]   PHYSICIAN:  Georgina Quint. Plotnikov, M.D. Romualdo Bolk OF BIRTH:  1946/01/10   DATE OF ADMISSION:  04/27/2006  DATE OF DISCHARGE:  04/30/2006                                 DISCHARGE SUMMARY   DISCHARGE DIAGNOSES:  1.  Severe anemia, multifactorial, corrected.  2.  Chronic wound abdominal wall slowly healing.  3.  History of methicillin-resistant Staphylococcus aureus infection.  4.  Chronic fever.  5.  Crohn's disease.  6.  Liver cirrhosis.  7.  Urinary tract infection.  8.  Depression.  9.  Diabetes.  10. History of encephalopathy.  11. Low back pain, likely related to compression fracture.   CONSULTATIONS:  1.  Infectious disease, Dr. Roxan Hockey.  2.  Surgery, Dr. Orson Slick.   DISCHARGE MEDICINES:  1.  Resume home meds.  2.  MS Contin 30 mg every morning (stop if to sedated).   DIET:  Resume previous.   FOLLOWUP PLANS:  1.  Dr. Posey Dowda in two weeks with lab work.  2.  Dr. Orson Slick in two weeks to check on abdominal wound.   HISTORY:  The patient is a 65 year old female with multiple medical problems  who was admitted primarily for a low hemoglobin and weakness.  For the  details, please address in my history and physical.   During the course of hospitalization the patient was complaining of ongoing  back pain uncontrolled with IV/p.o. oral morphine.  She has been spiking a  fever off and on.  Received a total of four units of blood with a minimal  reaction in the form of a fever.   On the day of discharge, she is feeling fairly well, however continues to  have pain in the back.   PHYSICAL EXAMINATION:  VITAL SIGNS:  Her temp is 99.8.  T-max 100.8.  Heart  rate 87.  Respirations 24.  Blood pressure 148/81.  Blood sugar is 190-105-  178.  GENERAL:  She is in no acute distress.  No pallor.  She is  alert, oriented  and cooperative.  HEENT:  With moist mucosa.  NECK:  Supple.  LUNGS:  Clear.  No wheezes.  HEART:  S1, S2.  ABDOMINAL:  Wound with vacuum device.  LOWER EXTREMITIES:  With trace edema.  Calves nontender.   LABORATORY DATA:  White count 2.8, hemoglobin 12.9, platelets 61.  Sodium  137, potassium 4.1.   Previous labs/x-rays:  Lumbar spine, four views:  1.  L1 compression fracture with approximately 30% loss of vertebral body      height.  New from 06/03/2005.  2.  A 1.2-cm calcification projects over the left renal outline.   Previous lab work:  White count 2.9, platelets 67.  Potassium 4.4,  creatinine 1.0.           ______________________________  Georgina Quint. Plotnikov, M.D. [facility]     AVP/MEDQ  D:  04/30/2006  T:  05/01/2006  Job:  [id number]  cc:   Lebron Conners, M.D.  [address], [address]  [place]  [place] [address]

## 2011-02-20 NOTE — H&P (Signed)
[place]. [facility]  Patient:    [name], [name]                         MRN: [id number] Adm. Date:  [id number] Disc. Date: [id number] Attending:  Dolores Patty CC:         Sonda Primes, M.D. [facility]                         History and Physical  CHIEF COMPLAINT: Ms. Huston is an unfortunate 65 year old white female who has Crohns disease and chronic pain syndrome, who presents with chest pain.  HISTORY OF PRESENT ILLNESS: She began to have chest pain at approximately 12 midnight on January 31, 2000 described as mid sternal with radiation to the shoulder blades and her jaw.  This was associated with diaphoresis and nausea but no vomiting.  The main character of the pain was that of a pressure phenomenon.  It was constant and still present despite having been evaluated and treated in the emergency room.  Consultation was requested for possible admission.  Following the onset of pain she took her hydrocodone pain medication without relief.  She showered and was profoundly weak.  The pain was also associated with diffuse headache over the top of her head. Actually she has had this headache for most of the week.  She has frequent headaches.  PAST MEDICAL HISTORY: Her past medical history is long and complicated.  Her most recent hospitalization was for chest pain in April 1999.  At that time myocardial infarction was ruled out.  She underwent cardiac catheterization, which was normal, on January 23, 1998.  She was found to be frankly diabetic and was begun on ADA diet at that time. The diagnosis was based on hemoglobin A1C of 9%.  Other diagnoses during that hospitalization included thrombocytopenia, which apparently is another chronic finding and for which she has been previously evaluated.  Her other past medical history includes cholecystectomy, five surgeries for Crohns disease, and a hysterectomy.  ALLERGIES: She is intolerant or allergic to  TETRACYCLINE and PROCARDIA.  PRESENT MEDICATIONS:  1. Prevacid 30 mg 2 q.d.  2. Glucophage 500 mg 2 q.d.  3. Effexor.  4. Premarin.  5. Lasix.  6. Prinivil 10 mg b.i.d.  7. Hydrocodone as-needed.  SOCIAL HISTORY: She quite smoking approximately 13 years ago.  She does not drink.  FAMILY HISTORY: Significant family history includes the death of her sister recently in January 2001 with myocardial infarction in that sister.  There is also another sister who has diabetes.  Her father died at age 47 of pulmonary embolus.  Mother has diabetes.  REVIEW OF SYSTEMS: As stated, Review Of Systems is positive for frequent headaches.  She has occasional sinus problems.  Her major problem is pain related to the surgeries and the Crohns disease.  She also has varicose veins which are pruritic and which are easily traumatized by itching.  PHYSICAL EXAMINATION:  GENERAL:  At this time she is in no acute distress.  VITAL SIGNS: Temperature 97.4 degrees, blood pressure 163/94, pulse 72.  HEENT:  Arteriolar narrowing was present.  Dentition is fair.  She has decreased auditory acuity but tympanic membranes appear normal.  NECK: She had no carotid bruits or lymphadenopathy about the head and neck or axilla.  HEART: An S4 was noted.  CHEST: Clear.  ABDOMEN: Diffusely tender, with decreased bowel sounds but no ileus.  She  had multiple operative scars.  EXTREMITIES: Homans sign negative.  Varicose veins were present.  She had multiple Band-Aids over her lower extremities covering excoriated areas. Pedal pulses were present.  BREAST/PELVIC/RECTAL: Initially deferred.  LABORATORY DATA: WBC 4600, hematocrit 35.6, platelet count 72,000; 50% lymphocytes, 38% segs.  Glucose was 127, indicating good diabetic control. INR was 1.4.  CPK-MB was 44.  Of interest, while monitored on telemetry she was describing palpitations although the telemetry revealed none.  ASSESSMENT/PLAN: She will be  admitted for monitoring with evaluation of cardiac enzymes serially as well as possible pulmonary embolism or aneurysm of the thoracic aorta.  Her Glucophage will be held, particularly receiving contrast.  She does have a chronic pain syndrome related to her Crohns and multiple surgeries.  At this time she is alert and oriented and her affect is somewhat flat and depressed despite the Effexor.  Additional evaluation may be necessary in this area.  The death of her sister recently of a myocardial event must be considered even though she has had a negative catheterization approximately two years ago. DD:  01/31/00 TD:  02/02/00 Job: [id number]

## 2011-02-20 NOTE — Assessment & Plan Note (Signed)
[facility]                                 ON-CALL NOTE   NAME:[name], [name]                           MRN:          [id number]  DATE:11/21/2006                            DOB:          [birth year]    Telephone number:  [phone].   PRIMARY CARE PHYSICIAN:  Georgina Quint. Plotnikov, M.D.   SUBJECTIVE:  Low back pain and swollen back for two weeks.  No falling  injury.  Cannot sleep at night.  Pain is severe bad.   ASSESSMENT/PLAN:  Recommend evaluation today at Urgent Care but the  patient refused.  She will call for an appointment with Dr. Posey Polakowski on  Monday.     Kerby Nora, MD  Electronically Signed    AB/MedQ  DD: 11/21/2006  DT: 11/21/2006  Job #: [id number]

## 2011-02-20 NOTE — H&P (Signed)
NAME:  [name], [name]                  ACCOUNT NO.:  [id number]   MEDICAL RECORD NO.:  [id number]          PATIENT TYPE:  INP   LOCATION:  3009                         FACILITY:  [facility]   PHYSICIAN:  Georgina Quint. Plotnikov, M.D. LHCDATE OF BIRTH:  01-14-1946   DATE OF ADMISSION:  12/06/2004  DATE OF DISCHARGE:                                HISTORY & PHYSICAL   CHIEF COMPLAINT:  Low hemoglobin, severe hip pain.   HISTORY OF PRESENT ILLNESS:  The patient is a 65 year old female with  multiple medical problems who was seen in the office yesterday with  weakness, cough, and possible urinary tract infection. Labs were ordered,  hemoglobin came back low, and she was sent to the ER.   PAST MEDICAL HISTORY:  1.  Anemia.  2.  Liver cirrhosis with pancytopenia.  3.  Depression.  4.  Diabetes, type 2.   ALLERGIES:  AMOXICILLIN and TETRACYCLINE.   CURRENT MEDICATIONS:  1.  Zantac 150 mg daily.  2.  Nexium 40 mg daily.  3.  Oxycodone 5-10 mg q.i.d. p.r.n.  4.  Celexa 40 mg one-half daily.  5.  Metformin 500 mg two b.i.d.  6.  Enalapril 20 mg b.i.d.  7.  Glyburide 5 mg b.i.d.  8.  Ceftin (new prescription) 500 mg p.o. b.i.d.  9.  Bactrim (on hold).   SOCIAL HISTORY:  She is married. Does not smoke or drink. She used to smoke  in the past.   FAMILY HISTORY:  Sister with adrenal insufficiency.   REVIEW OF SYSTEMS:  Chronic fatigue, chronic headache, chronic  musculoskeletal complaints. Hard of hearing. No chest pain or shortness of  breath. Hip pain without injury lately or worsening. The rest is negative.   PHYSICAL EXAMINATION:  VITAL SIGNS: Blood pressure 122/70, temperature 98.5,  saturations 93% on room air.  GENERAL: She is in no acute distress, tired.  HEENT: Moist mucosa.  NECK: Supple.  LUNGS: Clear.  No wheeze or rales.  HEART: No murmurs or gallops.  ABDOMEN: Soft and nontender. No organomegaly or masses felt.  EXTREMITIES: Lower extremities without edema. She is alert,  oriented, and  cooperative. Hips tender over trochanter, bilaterally. No bruises.   LABORATORY DATA:  Hemoglobin 7.2, MCV 85.9, INR 1.2.  LFTs within normal  limits. Urinalysis with 3-5 wbc's, 5-10 rbc's.   ASSESSMENT/PLAN:  1.  Severe anemia transfused with two units of red blood cells.  2.  Urinary tract infection. Continue with Ceftin.  3.  Bilateral hip trochanteric bursitis, status post recent injection      without relief. Obtain x-rays needed. Continue with Oxycodone.  4.  Type 2 diabetes. Continue current therapy. Hold Glucophage due to her      liver condition. Increase Glyburide.  5.  Liver cirrhosis, chronic.      AVP/MEDQ  D:  12/07/2004  T:  12/07/2004  Job:  [id number]

## 2011-02-20 NOTE — H&P (Signed)
NAME:  [name], [name]                  ACCOUNT NO.:  [id number]   MEDICAL RECORD NO.:  [id number]          PATIENT TYPE:  OBV   LOCATION:  4705                         FACILITY:  [facility]   PHYSICIAN:  Georgina Quint. Plotnikov, M.D. Romualdo Bolk OF BIRTH:  1945/10/13   DATE OF ADMISSION:  04/27/2006  DATE OF DISCHARGE:                                HISTORY & PHYSICAL   CHIEF COMPLAINT:  Weakness.   HISTORY OF PRESENT ILLNESS:  The patient is a 65 year old female with liver  cirrhosis and chronic anemia, was in yesterday for regular follow up visit.  Her hemoglobin came at 8.5.  I decided to admit her overnight for a blood  transfusion.   PAST MEDICAL HISTORY:  1.  Poorly healing abdominal wound status post abscess incision and      drainage.  She has been off antibiotics on a vacuum device.  2.  Chronic low back pain.  3.  Liver cirrhosis.  4.  Recurrent anemia.  5.  Chronic failure to thrive.  6.  Chronic leg edema, worse lately.  7.  GERD.  8.  Type 2 diabetes.  9.  Depression.   ALLERGIES:  1.  ZITHROMAX.  2.  KETAC.  3.  TETRACYCLINE.   MEDICINES:  1.  Phenergan p.r.n.  2.  Glimepiride 1 mg b.i.d.  3.  Nexium 40 mg daily.  4.  Wellbutrin XR 150 mg daily.  5.  Lactulose 30 mg __________daily.  6.  __________ mg q.i.d.  7.  Enalapril 10 mg daily.  8.  Spironolactone 100 mg daily.   SOCIAL HISTORY:  She is married, does not smoke or drink.   FAMILY HISTORY:  Positive for diabetes.   REVIEW OF SYSTEMS:  Weak and tired, abdominal discomfort, low back pain, no  blood sugar drops.  The rest is negative.   PHYSICAL EXAMINATION:  GENERAL:  She appears chronically ill.  HEENT:  Moist mucosa.  VITAL SIGNS:  Blood pressure 101/67, pulse 99, temp 98.0.  NECK:  Supple.  No thyromegaly or bruits.  Tender on the left side.  LUNGS:  Clear.  No wheeze or rales.  HEART:  S1, S2.  No gallop.  ABDOMEN:  Soft, sensitive.  There is a large defect in the middle with a  vacuum device attached,  foul-smelling drainage in the device's collecting  tank.  EXTREMITIES:  Right lower extremity with 1-2+ edema over the calves and left  with trace edema, slightly tender to palpation.  SKIN:  Aging changes.  No jaundice.   LABORATORY DATA:  Hemoglobin 8.5, INR 1.2, creatinine 0.9.   ASSESSMENT/PLAN:  1.  Recurrent anemia.  We will admit to transfuse 2 units of red blood      cells.  Check blood count tomorrow.  2.  Abdominal wall abscess, status post incision and drainage on a vacuum      device now.  Follow up with Dr. Orson Slick.  3.  Bilateral lower extremity swelling.  Increase Spironolactone to 160 mg      daily.  Obtain venous Doppler ultrasounds to rule out deep venous  thrombosis.  4.  Liver cirrhosis.  Continue current therapy.  5.  Chronic low back pain.  Continue with morphine.  6.  Diabetes.  We will continue with current therapy and sliding-scale      insulin.           ______________________________  Georgina Quint. Plotnikov, M.D. [facility]     AVP/MEDQ  D:  04/27/2006  T:  04/27/2006  Job:  [id number]   cc:   Lebron Conners, M.D.  [address], [address]  [place]  [place] [address]

## 2011-02-20 NOTE — Consult Note (Signed)
NAME:  [name], [name]                            ACCOUNT NO.:  [id number]   MEDICAL RECORD NO.:  [id number]                   PATIENT TYPE:  INP   LOCATION:  0157                                 FACILITY:  [facility]   PHYSICIAN:  Sandria Bales. Ezzard Standing, M.D.               DATE OF BIRTH:  [birth year]   DATE OF CONSULTATION:  11/26/2003  DATE OF DISCHARGE:                                   CONSULTATION   This is a 65 year old white female patient of Aleksei V. Plotnikov, M.D.,  who was seen either in the office or in a lab with a hemoglobin of 5.9 and  hematocrit of 19.8, a platelet count which was low.  She was admitted to the  hospital with a diagnosis of anemia.  The etiology was unclear at the time  of the dictation, and she was hospitalized for evaluation of this.   She has a significant GI history.  She has had multiple prior abdominal  operations.  She has a history of Crohn's.  It  sounds like she had a bowel  resection.  Then she had a hernia.  The hernia, however, got infected and  required a drainage of the hernia initially in August 2003 by Dr. Orson Slick.  Then she had the prosthetic material removed in October 2003.  Then she has  had two additional abdominal drainages, once in May 2004, one on March 14, 2003.   As best I can get from her in her story and reviewing her history, she has  done well from an abdominal standpoint since that time.   When she was admitted on the 14th, she had a CT scan which was obtained.  The abdominal CT scan shows an elliptical __________ fluid collection, which  is approximately 4.5 x 1.5 cm.  There is no stigmata of cellulitis, but the  length of time she has had the fluid collection really is unclear.  Also, on  the CT scan it was noted that she had cirrhosis.  She has bilateral  nonobstructive renal calculi, and she is status post cholecystectomy.  She  had no evidence of recurrence of Crohn's disease.   Her past history is significant in that she has a  history of Crohn's  disease, type 2 diabetes, hypertension.  She is, again, anemic,  thrombocytopenic, and depressed.  I think she has had at least one suicide  attempt with overdose of acetaminophen.   PHYSICAL EXAMINATION:  VITAL SIGNS:  Her temperature is 98.4, her pulse is  90, blood pressure 110/60.  GENERAL:  She is clearly diaphoretic, though she does not appear really  warm.  She is only a modest historian.  HEENT:  Unremarkable.  NECK:  Supple.  CHEST:  Her lungs are clear to auscultation.  ABDOMEN:  A well-healed midline scar.  She has kind of a panniculus which  flaps over the middle of her abdomen.  She has a right subcostal scar.  She  has no tenderness, no guarding, no redness over her wound.  There is a  little bit of right upper quadrant tenderness.   Labs that I have as of February 20 show a white blood count of 2600, which  is low.  She has a hemoglobin of 9.6, hematocrit 29.6.  Her platelet count  was only 56,000.  Her sodium was 132, potassium 3.8, chloride of 106, CO2  __________, glucose of 117, BUN of 7, creatinine of 0.7.  Her albumin is 2.  Calcium is 8.4.   IMPRESSION:  1. Abdominal wall fluid collection, etiology is probably from a prior     surgery.  This was an incidental finding on CT scan and the question is,     is this actually infected or it is benign sterile fluid?  I think the     first step would be to do an ultrasound-guided needle aspiration on this     and obtain fluid and if it is not infected, leave it alone; if it is     infected, then she will obviously have to have the wound opened.  2. Anemia, etiology unclear.  Plan:  Bone marrow.  3. Crohn's disease.  4. Type 2 diabetes mellitus.  5. Hypertension.  6. Thrombocytopenia.                                               Sandria Bales. Ezzard Standing, M.D.    DHN/MEDQ  D:  11/26/2003  T:  11/26/2003  Job:  [id number]   cc:   Lina Sar, M.D. [facility]   Aleksei V. Plotnikov, M.D. [facility]   Lorre Munroe., M.D.  Fax: [phone]

## 2011-02-20 NOTE — H&P (Signed)
NAME:  [name], [name]                            ACCOUNT NO.:  [id number]   MEDICAL RECORD NO.:  [id number]                   PATIENT TYPE:  INP   LOCATION:  0477                                 FACILITY:  [facility]   PHYSICIAN:  Georgina Quint. Plotnikov, M.D. [facility]      DATE OF BIRTH:  11-08-1945   DATE OF ADMISSION:  11/19/2003  DATE OF DISCHARGE:                                HISTORY & PHYSICAL   CHIEF COMPLAINT:  Fatigue.   HISTORY OF PRESENT ILLNESS:  The patient is a 65 year old female who came to  the laboratory today for some blood work.  I was called with her hemoglobin  being 5.9, hematocrit 19.8, platelet count low, manual count pending.  She  has been complaining of growing fatigue, worse lately.  No syncope.  No  hematologic complaints.   PAST MEDICAL HISTORY:  1. Crohn's disease.  2. Diabetes type 2.  3. Hypertension.  4. History of anemia.  5. History of thrombocytopenia.  6. History of depression.   CURRENT MEDICATIONS:  1. Glucophage 500 two twice a day.  2. Glyburide 2.5 mg b.i.d.  3. Unknown antidepressant prescribed by her psychiatrist.   ALLERGIES:  1. TETRACYCLINE.  2. __________.   FAMILY HISTORY:  Both parents died a while ago, and likely had coronary  disease.   SOCIAL HISTORY:  She is a nonsmoker.  Denies using alcohol.   REVIEW OF SYSTEMS:  She has been tired.  Denies, chest pain.  She has been  having nasal bleeds lately that she attributes to some sinus infection or  cold.  Denies purulent discharge.  No cough.  No blood in the stool.  The  rest is as above or negative.   PHYSICAL EXAMINATION:  VITAL SIGNS:  Blood pressure 110/70, heart rate 76,  respirations 18.  GENERAL:  She is in no acute distress.  She is pale.  HEENT:  Moist mucosa.  No signs of nasal bleeding.  NECK:  Supple.  No thyromegaly or bruits.  LUNGS:  Clear to auscultation and percussion.  No wheezes or rales.  HEART:  S1 and S2.  No murmur, no gallop.  ABDOMEN:  Soft, nontender.   No organomegaly, no masses felt.  EXTREMITIES:  Lower extremities without edema.  NEUROLOGIC:  She is alert, oriented, and cooperative.   LABORATORY DATA:  As above.   ASSESSMENT AND PLAN:  1. Severe anemia, recurrent, of unclear etiology.  I doubt that the     exacerbation is related to the nasal bleed, although it is possible.     Need to rule out GI blood loss.  She was scheduled to see Dr. Juanda Chance in     the near future for repeat upper and lower endoscopy anyway.  Will obtain     GI consultation.  May need to have a hematology/oncology consult to     further look into her anemia, especially if the endoscopies are  unremarkable.  Will transfuse with 2 units of red blood cells.  May need     another 2 unit transfusion tomorrow.  2. Hypertension.  Will hold current therapy.  3. Type 2 diabetes.  She just had the A1C done; results pending.  Continue     current therapy.  4. History of Crohn's disease.  5. Chronic fatigue exacerbated by #1.                                               Georgina Quint. Plotnikov, M.D. [facility]    AVP/MEDQ  D:  11/19/2003  T:  11/19/2003  Job:  [id number]   cc:   Lina Sar, M.D. [facility]

## 2011-02-20 NOTE — Consult Note (Signed)
NAME:  [name], [name] NO.:  [id number]   MEDICAL RECORD NO.:  [id number]                   PATIENT TYPE:  INP   LOCATION:  5704                                 FACILITY:  [facility]   PHYSICIAN:  Valetta Fuller, M.D.               DATE OF BIRTH:  [birth year]   DATE OF CONSULTATION:  04/30/2004  DATE OF DISCHARGE:                                   CONSULTATION   REASON FOR CONSULTATION:  Ureteral calculus.   HISTORY OF PRESENT ILLNESS:  [name] is a 65 year old female.  She has a  very complex medical history.  She apparently has seen, been evaluated, and  treated by urologist in the past, but she really was unable to give me any  specifics, nor was she able to recall the urologists who have evaluated her.  She does have an apparent history of nephrolithiasis.  She tells me she has  passed enumerable small stones in her life but denies every requiring any  surgical intervention.  She apparently also has quite a chronic history of  intermittent abdominal pain.  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  She is known to have chronic splenomegaly and pancytopenia.  She  also has a history of Crohn's disease and has apparently had numerous  admissions for abdominal pain for a variety of different etiologies.  She  tells me that she has not felt good for several weeks, although she has a  whole host of complaints.  She was recently admitted because of some  increasing abdominal pain and apparent fever.  She has been afebrile since  her admission here.   As part of her evaluation, the patient had a CT scan of the abdomen and  pelvis.  This revealed some small bilateral renal calculi.  She did have  right hydronephrosis and what appeared to be a 5 mm stone in her right  distal ureter.  Urinalysis showed no evidence of obvious infection.  Her  renal function has been normal.  She does have a mild neutropenia and has  also had a reduced platelet count in  the 60,000 to 80,000 range.  She has  been admitted for supportive care and has had GI consultation.  The exact  etiology of her abdominal pain is not entirely clear, although the ureteral  calculus is likely to be the most important factor.   Tonight she complains of ongoing pain, although she does not appear to be  markedly uncomfortable.  She continues to complain of nausea and has  intermittently had diarrhea.   PAST MEDICAL HISTORY:  Again is significant for liver cirrhosis.  This is  apparently non-alcoholic and non-viral cirrhosis.  She does have a  longstanding history of splenomegaly and pancytopenia.  She has also had  apparently chronic leukopenia.  She has Crohn's disease.  She has had type 2  diabetes, which  is longstanding, hypertension, obesity, and depression. She  has had numerous intra-abdominal surgeries including incisional hernia  repairs.  She has had complications including fluid collections and has had  a chronic seroma under one of her incisions.   CURRENT MEDICATIONS:  Nexium, Celexa, Vasotec, Glucophage, glyburide,  oxycodone, and recently ciprofloxacin.   SOCIAL HISTORY:  The patient is a nonsmoker.   FAMILY HISTORY:  Otherwise noncontributory.   REVIEW OF SYSTEMS:  Almost globally positive.  She complains of occasional  chills, abdominal pain, whole body aches, back discomfort, hematuria, and  some lower extremity discomfort.   PHYSICAL EXAMINATION:  GENERAL:  The patient is a moderately obese female.  She is nontoxic in appearance and in no acute distress.  LUNGS: Respiratory effort appears normal, and she is not tachypneic.  ABDOMEN:  Fairly soft.  She does have some mild tenderness diffusely.  Tenderness is somewhat increased in the right lower to mid quadrants of her  abdomen.  She has no real CVA tenderness at this time.  She has a  significant number of well-healed incisions.   DATA:  Significant as mentioned above.   IMPRESSION:  Right distal  ureteral calculus.  I spent a good half hour with  the patient as well as reviewing her records and her imaging studies.  She  does have a history of nephrolithiasis, apparently has passed all stones in  the past spontaneously.  She does report a history of some fever prior to  admission, but she has now been afebrile, and her urine does not show any  evidence of obvious urinary tract infection.  For that reason, I do not  think there is any urgent need for intervention.  She is told that a stone  this size in this location has approximately a 50/50 chance of passing  spontaneously.  This can take anywhere from a couple of days to several  weeks.  Normally if a patient can be managed as an outpatient with control  of the pain and nausea, it is prudent to have the patient attempt to manage  the stone and pass it spontaneously.  She certainly is at increased risk for  anesthesia with her issues including her pancytopenia, diabetes, and other  issues.  It would obviously be preferable if the stone would be able to be  passed spontaneously.  If, however, she continues to complain of ongoing  nausea and is unable to take p.o. well, or if she continues to require pain  medication beyond oral medications, or if she develops increased fever, then  intervention would indeed be indicated.  I have encouraged her to try to see  if she can pass this spontaneously on her own, but she is going to think  about things, and we will touch bases with her over the next day or so.                                               Valetta Fuller, M.D.    DSG/MEDQ  D:  04/30/2004  T:  04/30/2004  Job:  [id number]   cc:   [name], M.D. [facility]

## 2011-02-20 NOTE — Discharge Summary (Signed)
NAME:  [name], [name]                            ACCOUNT NO.:  [id number]   MEDICAL RECORD NO.:  [id number]                   PATIENT TYPE:  INP   LOCATION:  3734                                 FACILITY:  [facility]   PHYSICIAN:  Bruce R. Juanda Chance, M.D. [facility]           DATE OF BIRTH:  03/25/1946   DATE OF ADMISSION:  06/06/2002  DATE OF DISCHARGE:  06/07/2002                                 DISCHARGE SUMMARY   DISCHARGE DIAGNOSES:  1. Chest pain.  2. Diabetes mellitus.  3. Crohn's disease.  4. Hypertension.  5. Depression.  6. Gastroesophageal reflux disease.  7. Abdominal abscess, status post hernia repair.   HOSPITAL COURSE:  The patient is a 65 year old female with no known coronary  artery disease history.  She presented to the emergency room on September  02nd complaining of left-sided chest pain, which radiated to the back and  was heavy in nature.  She rated it a 10/10 and was said it was accompanied  by shortness of breath, but no nausea or diaphoresis.  The patient was seen  and admitted by Dr. Olga Millers.  Dr. Jens Som felt that the patient's  chest pain was positional in nature.  He also noted that the patient an echo  and cardiac catheterization in April 1999, which showed no significant  coronary artery disease.  The plan was to admit the patient, rule her out  for a myocardial infarction with serial enzymes.  If they were negative the  patient could undergo an outpatient stress test.  He also noted the patient  was markedly pancytopenic; however, this appeared to be chronic dating from  at least January 2003.   The next day the patient was seen by Dr. Charlies Constable.  The patient reported  some pain overnight; however, she felt fairly well on awakening.  Of note,  the patient complained of acute onset of pain after eating.  She also  complained of some epistaxis overnight and her aspirin was discontinued.  Overall, Dr. Juanda Chance felt that the patient was doing well and  stable for  discharge with outpatient follow up.   DISCHARGE MEDICATIONS:  1. Glucophage XR 1,000 mg b.i.d.  2. Glyburide 2.5 mg b.i.d.  3. Prevacid 30 mg b.i.d.  4. Vasotec 20 mg b.i.d.  5. Celexa as previously taken.  6. Vancomycin as previously taken.   LABORATORY VALUES:  Sodium 137, potassium 3.6, chloride 112, CO2 20, BUN 7,  creatinine 0.6, and glucose 57.  White count 3.1, hemoglobin 9.6, hematocrit  30.5 and platelets 85,000.  Electrocardiogram showed sinus rhythm at 97, PR  interval 145, QRS 75, QTC 41, and axis 68.   DISCHARGE INSTRUCTIONS:  1. The patient was advised to gradually increase her level of activity.  2. She is to follow a low-fat diabetic diet.  3. She is to continued to have her dressings changed per surgery as well as  home health.  4. She is to follow up with Dr Orson Slick as needed or as scheduled.  5. She is to follow up with Dr. Posey Ibe on September the 17th at 3:30 in     the afternoon.  6. She is also to undergo an adenosine Cardiolite exam on September the 04th     at 11:30 in the morning at the [employer].  She was provided with     instructions for  preparations for this test.        C. Brita Romp, P.A. [facility]                  Bruce R. Juanda Chance, M.D. [facility]    CKM/MEDQ  D:  06/07/2002  T:  06/08/2002  Job:  [id number]   cc:   Georgina Quint. Plotnikov, M.D. [facility]   Dora M. Juanda Chance, M.D. [facility]   Skeet Simmer., M.D.  Fax: [phone]

## 2011-02-20 NOTE — Discharge Summary (Signed)
   NAME:  [name], [name]                            ACCOUNT NO.:  [id number]   MEDICAL RECORD NO.:  [id number]                   PATIENT TYPE:  INP   LOCATION:  5706                                 FACILITY:  [facility]   PHYSICIAN:  Skeet Simmer., M.D.         DATE OF BIRTH:  Jan 15, 1946   DATE OF ADMISSION:  05/15/2002  DATE OF DISCHARGE:  06/02/2002                                 DISCHARGE SUMMARY   HISTORY:  The patient is a diabetic female who has recurrent abscess of the  abdominal wall following repair of incisional hernia.  Mesh remained in  place.  See history and physical for details.   PHYSICAL EXAMINATION:  ABDOMEN:  Remarkable for tense red obvious abscess on  the abdominal wall.  Turbid fluid was aspirated.   HOSPITAL COURSE:  The patient was admitted by Dr. Lindie Spruce and put on IV  antibiotics.  Drainage was established.  This was done by percutaneous  catheter.  When I resumed care of the patient, I felt that the drainage was  inadequate and that the patient needed open treatment of this problem  because of the chronicity of it.  On May 23, 2002, I took her to the  operating room and drained the abscess by large open incision and thereafter  wound packing was established.  Soon granulation tissue began to form.  The  mesh appeared to be incorporating into the tissues.  The patient became  quite anemic and was transfused.  Hemoglobin was 6.3 at that time.  The  patient became afebrile.  Because of the presence of MRSA, Vancomycin was  used.  She went home on Vancomycin of peripherally inserted central  catheter.  She went home with VAC drainage and was tolerating that well.  The patient is to see me at the office in about a week and had home nursing  set up.   DIAGNOSES:  1. Abscess of the abdominal wall.  2. Incisional hernia, repaired.  3. Diabetes mellitus.   OPERATION:  Drainage of abdominal wall abscess.   DISCHARGE CONDITION:  Improved.                                  Skeet Simmer., M.D.    Elvis Coil  D:  06/28/2002  T:  07/01/2002  Job:  [id number]

## 2011-02-20 NOTE — Op Note (Signed)
[place]. [facility]  Patient:    [name], [name]                         MRN: [id number] Proc. Date: 02/24/00 Adm. Date:  [id number] Disc. Date: [id number] Attending:  Meredith Leeds                           Operative Report  PREOPERATIVE DIAGNOSIS:  Varicose veins.  POSTOPERATIVE DIAGNOSIS:  Varicose veins.  OPERATION:  Ligation and excision of varicose veins.  SURGEON:  Zigmund Daniel, M.D.  ANESTHESIA: General.  DESCRIPTION OF PROCEDURE:  After marking the veins with patient in standing position so that they stood out well, she was taken to the operating room and had induction of general anesthesia and routine preparation and draping of the right leg.  I made multiple cut downs along the course of the marked veins which were mostly in the anterior thigh, lateral thigh, and lateral leg.  I tied off the side branches and perforating veins and avulsed long segments of the varicosities.  I got hemostasis with pressure, then closed each incision with intracuticular 4-0 Vicryl and a Steri-Strip.  I applied a bulky compressive bandage.  I then prepped a small area of varicosity in the left medial leg and excised the varicosity at that location and closed the incision with an intracuticular 4-0 Vicryl and a Steri-Strip and applied a compressive bandage.  She tolerated the operation well. DD:  02/24/00 TD:  02/28/00 Job: [id number]

## 2011-02-20 NOTE — Discharge Summary (Signed)
[place]. [facility]  Patient:    [name], [name] Visit Number: [id number] MRN: [id number]          Service Type: SUR Location: 5700 [id number] Attending Physician:  Meredith Leeds Dictated by:   Zigmund Daniel, M.D. Admit Date:  01/04/2002 Discharge Date: 01/10/2002                             Discharge Summary  HISTORY OF PRESENT ILLNESS:  The patient is a 65 year old white female who has had several operations for Crohns disease and developed an uncomfortable bulge in the mid abdomen in the incision.  CT scan proved that this was an incisional hernia containing omentum.  It is increased in size and discomfort over time.  She gets a very full sensation in the abdomen but no vomiting. The patient also had had a cholecystectomy through a subcostal incision.  She requested repair of the hernia and accepted the need for hospitalization and possible complications.  The patient is not on any medications for Crohns disease at this time.  Other operations include hysterectomy, excision of a breast cyst, and knee surgery.  She also is diabetic, diet and medication controlled.  ALLERGIES:  TETRACYCLINE.  MEDICATIONS:  Paxil, Premarin, Amaryl, glucophage, Prevacid, Celebrex, hydrocodone for pain and furosemide occasionally for swelling.  See admission history for further details.  PHYSICAL EXAMINATION:  GENERAL APPEARANCE:  The patient was somewhat overweight.  She is in no acute evident illness.  VITAL SIGNS:  Unremarkable.  ABDOMEN:  There was a right subcostal incision and a long midline incision with hernia present in the midline incision.  No inguinal hernia was present. Bowel sounds were normal.  There were no masses.  HOSPITAL COURSE:  On the time of admission, the patient underwent laparoscopy with an attempt at a laparoscopic incisional hernia repair. Because of finding that she had cirrhosis of the liver on entering the abdomen and some  tendency for bleeding with dissection of her omentum and extreme difficulty of dissection because of the Swiss cheese type hernia with a lot of incarcerated omentum which was difficult to pull down, I decided to abandon the laparoscopic technique and perform an open incisional hernia repair with primary closure of the midline defect and overlay of a large piece of polypropylene mesh.  The patient tolerated the operation all right.  She was stable postoperatively. She ran some fever but developed no sign of wound infection.  Protime was found to have INR of 1.5.  A platelet count was 58,000.  She had no evident bleeding complications.  Her CBGs remained under good control. The drainage fell off pretty well and was only moderate at the time of discharge.  I planned to discharge her on January 09, 2002, as she was eating well and feeling well, but she spiked a fever to 103.  She felt fine. Blood and urine cultures were pending.  I gave her a gram of Rocephin the day before and a gram on the day of discharge and let her go home on Cipro 500 mg twice a day to be followed up in the office when culture results were available.  At the time of this dictation, I find that she had no growth of the blood culture and that urine culture had multiple species present with no uropathogens identified, probably a contamination. I  speculate that she probably had pulmonary atelectasis as a cause of her problem.  This could certainly have produced a fever of 103 without making her feel too bad.  The patient is discharged with drains in place and she knows how to handle them.  DISCHARGE MEDICATIONS:  She is given a prescription for Tylox and asked to take multivitamins and her Cipro.  FOLLOW-UP:  I will see her in the office in about a week.  DIAGNOSES: 1. Incisional hernia. 2. Diabetes mellitus, type 2. 3. Depression. 4. Crohns disease, currently inactive. 5. Gastroesophageal reflux. 6. Cirrhosis of the  liver.  OPERATION:  Repair of the incisional hernia.  CONDITION ON DISCHARGE:  Improved. Dictated by:   Zigmund Daniel, M.D. Attending Physician:  Meredith Leeds DD:  01/17/02 TD:  01/17/02 Job: [id number]

## 2011-02-20 NOTE — Discharge Summary (Signed)
NAME:  [name], [name]                  ACCOUNT NO.:  [id number]   MEDICAL RECORD NO.:  [id number]          [name] TYPE:  INP   LOCATION:  5008                         FACILITY:  [facility]   PHYSICIAN:  Valerie A. Felicity Coyer, MDDATE OF BIRTH:  Apr 02, 1946   DATE OF ADMISSION:  06/04/2006  DATE OF DISCHARGE:  06/09/2006                                 DISCHARGE SUMMARY   DISCHARGE DIAGNOSES:  1. Fever, likely secondary to urinary tract infection.  2. Chronic abdominal pain.  3. 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  4. Pancytopenia, likely secondary to chronic liver disease.  5. Chronic depression.  6. Diabetes type 2.   HISTORY OF PRESENT ILLNESS:  [name] is a 65 year old white female who was  admitted on June 04, 2006 by Dr. Posey [name] with chief complaint of nausea,  vomiting, fever and weakness.  These symptoms had been occurring over  several days prior to admission and [name] was admitted for further  evaluation and treatment.   PAST MEDICAL HISTORY:  1. Liver cirrhosis.  2. Type 2 diabetes.  3. Hypertension.  4. Crohn's disease.  5. Chronic depression.  6. 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  7. Nephrolithiasis.  8. Chronic UTI.  9. History of bilateral subarachnoid hemorrhage August 2007.   HOSPITAL COURSE:  PROBLEM #1 - FEVER, LIKELY SECONDARY TO URINARY TRACT  INFECTION:  [name] was admitted and was started on antibiotics.  Urinalysis and culture which was sent after initiation of antibiotics was  unrevealing.  [name] has remained afebrile and nausea and vomiting have  resolved.  At this time, we will continue p.o. Ceftin at time of discharge.   [name] was evaluated by [name] wound care nurse during this admission  secondary to abdominal wound.  She apparently had missed her appointment  with Dr. Orson [name] on [name] week of admission.  It was recommended that [name]  [name] continue packing [name] wound with calcium alginate to absorb drainage  and to  follow up with surgery after discharge.  Home health will be arranged  prior to discharge today.   PROBLEM #2 - PANCYTOPENIA:  [name] was noted to have pancytopenia  during this admission, which is most likely secondary to chronic liver  disease. This will be continued outpatient monitoring.   DISCHARGE MEDICATIONS:  1. Ceftin 250 mg p.o. twice daily through June 14, 2006.  2. Lactulose 60 mg p.o. daily.  3. Spironolactone 100 mg p.o. daily.  4. Morphine IR 15 mg four times daily as needed.  5. Morphine CR 30 mg p.o. daily.  6. Nexium 40 mg p.o. daily.  7. Lexapro 20 mg p.o. daily.  8. Bupropion XL 300 mg p.o. daily.  9. Gabapentin 300 mg three times daily as needed.  10.Enalapril 5 mg p.o. daily.  11.Promethazine 25 mg four times daily.  12.Glimepiride 2 mg p.o. b.i.d. as before.   PERTINENT LABORATORY DATA AT DISCHARGE:  Urine culture negative.  Hemoglobin  A1c 7.4.  Hemoglobin 10.9, hematocrit 31.3, white blood cell count of 1.7,  platelets 51,000.  BUN 9, creatinine 0.8.   DISPOSITION:  Plan to transfer [name] to home.  She is to follow up  with Dr. Posey [name], next week and she is also instructed to call Dr. Orson [name]  for an appointment.  She is instructed to call Dr. Posey [name] should she  develop fever over 101, nausea or vomiting.     ______________________________  [name] Craze, PA      [name]. Felicity Coyer, MD  Electronically Signed    MO/MEDQ  D:  06/09/2006  T:  06/09/2006  Job:  [id number]

## 2011-02-20 NOTE — Op Note (Signed)
NAME:  [name], [name]                            ACCOUNT NO.:  [id number]   MEDICAL RECORD NO.:  [id number]                   PATIENT TYPE:  AMB   LOCATION:  DSC                                  FACILITY:  [facility]   PHYSICIAN:  Katy Fitch. Naaman Plummer., M.D.          DATE OF BIRTH:  1946/06/12   DATE OF PROCEDURE:  07/10/2003  DATE OF DISCHARGE:                                 OPERATIVE REPORT   PREOPERATIVE DIAGNOSIS:  Chronic stenosing tenosynovitis right index, long  and small fingers.   POSTOPERATIVE DIAGNOSIS:  Chronic stenosing tenosynovitis right index, long  and small fingers.   OPERATION:  1. Release of right index finger A1 pulley.  2. Release of right long finger A1 pulley.  3. Release of right small  finger  A1 pulley.   SURGEON:  Katy Fitch. Sypher, M.D.   ASSISTANT:  Marveen Reeks. Dasnoit, P.A.-C.   ANESTHESIA:  0.25% Marcaine and 2% Lidocaine metacarpal head level  block of  right index, long and small fingers, supplemented by IV sedation.  Supervising anesthesiologist Kaylyn Layer. Michelle Piper, M.D.   INDICATIONS FOR PROCEDURE:  [name] is a 65 year old woman with a history  of  chronic  stenosing tenosynovitis affecting her right index, long and  small fingers. She has failed nonoperative management. Due to a failed  response to nonoperative measures, she is brought to the operating room at  this time for release of her index, long and small finger A1 pulleys.   DESCRIPTION OF PROCEDURE:  [name] is brought to the operating room and  placed in the supine position upon the operating table. Following light  sedation the right arm was prepped with Betadine soap and solution and  sterilely draped. Following exsanguination of the limb with an Esmarch  bandage, an arterial tourniquet to the proximal brachium was inflated to 230  mmHg.   When anesthesia was satisfactory the procedure commenced with a short  transverse incision directly over the A1 pullies of the index, long and  small fingers. Each incision was carefully  dissected revealing the palmar  fascia. The fascia was released, allowing visualization of the A1 pulley.   The A1 pulley of the  index finger  was released along its radial border. A  small A0 pulley was released. The tendon was delivered and found to be  invested in inflamed tenosynovium. This was removed with a rongeur.   Thereafter full range of motion of the finger  was recovered. The long  finger flexors were released after the A1 pulley release along its central  portion, followed  by release of an A0 pulley. The tenosynovium did not  require debridement in the long finger. The small finger  A1 pulley was  isolated and similarly released along its radial border.   The wounds were irrigated and subsequently repaired with interrupted sutures  of 5-0 nylon. The wounds were dressed with Xeroflo, sterile gauze  and Ace  wrap. There were no apparent complications.                                               Katy Fitch Naaman Plummer., M.D.    RVS/MEDQ  D:  07/10/2003  T:  07/10/2003  Job:  [id number]

## 2011-02-20 NOTE — H&P (Signed)
NAME:  [name], [name]                  ACCOUNT NO.:  [id number]   MEDICAL RECORD NO.:  [id number]          PATIENT TYPE:  OBV   LOCATION:  6738                         FACILITY:  [facility]   PHYSICIAN:  Georgina Quint. Plotnikov, MDDATE OF BIRTH:  [birth year]   DATE OF ADMISSION:  12/07/2006  DATE OF DISCHARGE:                              HISTORY & PHYSICAL   24-HOUR OBSERVATION NOTE:   CHIEF COMPLAINT:  Weakness.   HISTORY OF PRESENT ILLNESS:  The patient is a 65 year old female with  multiple medical problems who came for a regular visit yesterday,  received blood work and was found to have hemoglobin of 8.8.  She was  asked to come today for 24-hour observation admission for blood  transfusion.   PAST MEDICAL HISTORY:  1. Recurrent anemia.  2. Thrombocytopenia.  3. Liver cirrhosis.  4. Chronic depression.  5. Diabetes.  6. Depression.  7. Chronic low back pain.  8. Anxiety.  9. Insomnia.   SOCIAL HISTORY:  She is married, does not smoke, no alcohol.   FAMILY HISTORY:  Positive for diabetes.   ALLERGIES:  NO KNOWN DRUG ALLERGIES.   CURRENT MEDICATIONS:  Include:  Morphine, Xanax, glimepiride (she does not have her meds with her and I  do not have a chart).   REVIEW OF SYSTEMS:  Chronic fatigue, depressed.  No chest pain or  chronic shortness of breath with exertion.  No nausea, vomiting, no  blood in the stool.  The rest of the 18-point review of systems is  negative.  She has been having more itching lately with scratch marks on  her arms.   PHYSICAL EXAMINATION:  Blood pressure 119/71.  Pulse 110.  Temperature  99.5.  Weight 250 pounds.  She is in no acute distress.  A little pale,  sad.  HEENT:  Moist mucosa.  NECK:  Supple, no thyromegaly or bruit.  LUNGS:  Clear, no wheezes.  HEART:  S1, S2, slight tachycardia.  ABDOMEN:  Soft, obese, nontender.  No organomegaly, no masses.  LOWER EXTREMITIES:  Without edema.  NEUROLOGIC:  She is depressed, alert, oriented and  cooperative.  Cranial  nerves II-XII nonfocal.  SKIN:  With ecchymosis on the upper extremities mostly and excoriations  on the forearms.   LABORATORY DATA:  Hemoglobin 8.8, white count 2.5, platelet count 37,  other labs are pending.   ASSESSMENT/PLAN:  1. Anemia, severe chronic and recurrent.  We will admit to transfuse      with 2 units of red blood cells, premedicate with Benadryl.  We      will discharge tomorrow if doing well.  Repeat CBC in the morning.  2. Liver cirrhosis, chronic.  Ammonia level is pending.  3. Type 2 diabetes, we will cover with sliding scale, continue planned      therapy.  4. Depression, worse due to stress at home.  Continue with current      therapy.      Georgina Quint. Plotnikov, MD  Electronically Signed     AVP/MEDQ  D:  12/07/2006  T:  12/08/2006  Job:  [id number]

## 2011-02-20 NOTE — Discharge Summary (Signed)
NAME:  [name], [name]                  ACCOUNT NO.:  [id number]   MEDICAL RECORD NO.:  [id number]          PATIENT TYPE:  INP   LOCATION:  5742                         FACILITY:  [facility]   PHYSICIAN:  Lebron Conners, M.D.   DATE OF BIRTH:  July 26, 1946   DATE OF ADMISSION:  03/19/2006  DATE OF DISCHARGE:  03/23/2006                                 DISCHARGE SUMMARY   HISTORY:  This is a 65 year old white female with cirrhosis and a history of  many problems with abdominal wounds, hernias, etcetera.  She had a chronic  abdominal abscess and was brought to the hospital for open drainage and  possible excision of infected mesh.   PAST HISTORY:  Beyond the above is remarkable for:  1. Type 2 diabetes.  2. High blood pressure.  3. Chronically low platelet count.  4. She has had a hysterectomy.  5. Cholecystectomy.  6. Colon tumor removed.  7. Open hernia repair last in 2003.   See history and physical for greater details.   HOSPITAL COURSE:  On the date of admission, the patient underwent incision  and drainage of a large abdominal wall abscess with excision of a large  piece of infected mesh.  She tolerated the operation well and blood loss was  minimal and there were no complications.  Dressing changes were commenced.  Pain control was adequate.  The patient had some low grade fever  postoperatively.  The vancomycin was used for treatment because of the  culture being positive for MRSA.  On March 23, 2006, she was afebrile.  Her  wound looked good and was beginning to granulate.  She was sent home with  daily dressing changes ordered.  I did not feel that further antibiotic  treatment was necessary since there was no cellulitis.   DIAGNOSES:  1. Abdominal wall abscess with infected polypropylene mesh.  2. Cirrhosis with ascites and splenomegaly.  3. Chronic low platelet count.  4. Type 2 diabetes.  5. Hypertension.   OPERATION:  Excision of infected mesh and drainage of abdominal  wall  abscess.   DISCHARGE CONDITION:  Improved.      Lebron Conners, M.D.  Electronically Signed     WB/MEDQ  D:  06/08/2006  T:  06/08/2006  Job:  [id number]

## 2011-02-20 NOTE — H&P (Signed)
[place]  Patient:    [name], [name] Visit Number: [id number] MRN: [id number]          Service Type: SUR Location: [id number] Attending Physician:  Meredith Leeds Dictated by:   Zigmund Daniel, M.D. Admit Date:  01/04/2002 Discharge Date: 01/10/2002                           History and Physical  CHIEF COMPLAINT: Abdominal pain.  HISTORY OF PRESENT ILLNESS: The patient is a 65 year old white female, who has had a number of operations for Crohns disease and has developed an uncomfortable bulge in the mid abdomen.  CT scan proves that this is an incisional hernia containing omentum.  It has increased in size over time. She gets a full sensation in the abdomen but had not had vomiting.  The patient also has had a cholecystectomy through a subcostal incision.  She requested repair of the hernia and is admitted to the hospital for that.  PAST MEDICAL HISTORY:  1. The patient has Crohns disease but is not on a specific medication for     that at this time.  2. Hysterectomy.  3. Excision of a breast cyst.  4. Surgery on the knee.  5. She suffers from acid reflux.  6. She is diabetic, diet and medication controlled.  ALLERGIES: TETRACYCLINE.  She did not recall what that medication caused.  MEDICATIONS:  1. Paxil 10 mg q.d.  2. Premarin 0.625 mg q.d.  3. Amaryl 2 mg q.d.  4. Glucophage 2000 mg q.d.  5. Prevacid 30 mg b.i.d.  6. Celebrex 200 mg q.d.  7. Hydrocodone as needed for pain.  8. Furosemide 20 mg as needed for swelling.  SOCIAL HISTORY: She does not smoke or drink.  REVIEW OF SYSTEMS: See previous hospital records for greater details.  The patient denies any heart problems, lung problems, and other serious medical problems.  PHYSICAL EXAMINATION:  GENERAL: No acute distress.  Mental status was normal.  The patient is moderately obese.  HEENT: Unremarkable.  VITAL SIGNS: Unremarkable per nursing  staff.  CHEST: Clear to auscultation.  HEART: Rate and rhythm normal.  No murmur or gallop.  BREAST: Normal.  ABDOMEN: Right subcostal incision and long midline incision with hernia present in the midline incision.  No inguinal hernia present.  No masses otherwise present.  Bowel sounds normal.  RECTAL/PELVIC: Not performed on this date.  EXTREMITIES: No edema or deformity.  Pulses good.  SKIN: No lesions noted.  NEUROLOGIC: Grossly normal.  IMPRESSION:  1. Incisional hernia.  2. Crohns disease, currently inactive.  3. Gastroesophageal reflux disease.  4. Type 2 diabetes.  PLAN: Repair of incisional hernia.  I will try the laparoscopic approach, although I am fairly certain she may have to have an open repair because of the number of previous operations. Dictated by:   Zigmund Daniel, M.D. Attending Physician:  Meredith Leeds DD:  01/04/02 TD:  01/05/02 Job: [id number]

## 2011-02-20 NOTE — Discharge Summary (Signed)
NAME:  [name], [name]                  ACCOUNT NO.:  [id number]   MEDICAL RECORD NO.:  [id number]          PATIENT TYPE:  INP   LOCATION:  5020                         FACILITY:  [facility]   PHYSICIAN:  Thomos Lemons, M.D.       DATE OF BIRTH:  1946/03/30   DATE OF ADMISSION:  05/05/2005  DATE OF DISCHARGE:  05/12/2005                                 DISCHARGE SUMMARY   DISCHARGE DIAGNOSES:  1.  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  2.  Duodenal ulcers per endoscopy performed on May 07, 2005.  3.  Acute renal failure.  4.  Pancytopenia. The patient will need outpatient  hematology evaluation.  5.  Diabetes type 2.  6.  History of Crohn's disease with chronic abdominal pain.   HISTORY OF PRESENT ILLNESS:  The patient is a 65 year old female who was  admitted with history of 1 week of generalized weakness, melena, and  generalized abdominal pain. The patient had associated falls, nausea,  vomiting, and weight loss over 20 pounds in the month prior to admission.  The patient was admitted for rehydration and further evaluation.   PAST MEDICAL HISTORY:  1.  Crohn's disease with cirrhosis.  2.  Type 2 diabetes.  3.  Depression.  4.  Chronic abdominal pain syndrome apparently due to Crohn's disease.   HISTORY OF PRESENT ILLNESS:  1.  NASH cirrhosis. The patient was evaluated by GI during this      hospitalization. She was noted to have a heme positive stool. The      patient has a history of a normal colonoscopy February 2005 as well as a      capsule endoscopy, which was performed in the same year. The patient      underwent endoscopy, which showed acute gastritis as well as a duodenal      lesion for which pathology was within normal limits. The patient was      placed on Lactulose secondary to elevated pneumonia level, which was 67      at time of admission. The patient was given Lactulose with good      response. Plan to discharge the patient to home on maintenance      Lactulose.   1.  Acute  renal failure. The patient was noted to be dehydrated at time of      admission. The patient was given intravenous fluids. Creatinine on      admission was 2.2 and at time of discharge has resolved down to 0.7.   1.  Pancytopenia with anemia. The patient will need outpatient hematology      evaluation. The patient is noted to have a white blood cell count of      2.7, platelet count of 88,000, hemoglobin 9.7 and hematocrit 28.4.   1.  Diabetes type 2. Glucophage was discontinued secondary to patient's      liver disease and recent renal insufficiency. The patient was started on      low dose Amaryl at this time. CBG's remained stable. Will need      outpatient followup.   1.  History of Crohn's disease with chronic abdominal pain. The patient is      currently tolerating p.o.'s with no nausea, no vomiting, no diarrhea.      The patient does report abdominal pain, however, appears to be at      baseline.   DISCHARGE MEDICATIONS:  1.  Wellbutrin 150 mg p.o. twice daily.  2.  Nexium 40 mg daily.  3.  Aldactone. The patient is to resume pre-admission dosing.  4.  Multivitamin 1 daily.  5.  Lactulose 30 cc p.o. daily. Prescription given for 1 month supply.  6.  Phenergan 25 mg p.o. q. 6 hours p.r.n. nausea. Prescription given for 1      month supply.  7.  Iron 325 mg p.o. daily.  8.  Amaryl 1 mg daily.  9.  Metformin/Glucophage. The patient is NOT to take this medication at this      time.  10. Morphine. The patient is to resume pre-admission dosing per Dr.      Posey Ferner.   DISCHARGE LABORATORY DATA:  Hemoglobin 9.7, hematocrit 28.4. White blood  cell count 2.7. Platelets 88.000. Ammonia 38. BUN 7, creatinine 0.7.   FOLLOW UP:  A followup appointment has been arranged for the patient to see  Dr. Posey Merrihew on May 20, 2005 at 2:30 p.m. The patient is to call Dr.  Posey Bobb should she develop nausea, vomiting, or diarrhea.   The patient has refused home PT, OT, or home health. The  patient will need  hematology evaluation to be arranged as an outpatient secondary to  pancytopenia.      Katrinka Blazing   MSO/MEDQ  D:  05/12/2005  T:  05/12/2005  Job:  [id number]   cc:   Thomos Lemons, D.O. [facility]   Georgina Quint. Plotnikov, M.D. [facility]

## 2011-02-20 NOTE — Discharge Summary (Signed)
[facility]  Patient:    [name], [name] Visit Number: [id number] MRN: [id number]          Service Type: EMS Location: [facility] Attending Physician:  Hanley Seamen Dictated by:   Reymundo Poll Dub Mikes, M.D. Admit Date:  10/22/2001 Discharge Date: 10/22/2001                             Discharge Summary  CHIEF COMPLAINT AND HISTORY OF PRESENT ILLNESS:  This was the first admission to [facility] for this 65 year old female admitted after she overdosed on Tylenol.  She and her husband got into a fight.  She got very upset, she took and overdose of Tylenol to show him.  Last year apparently responding to friction in the relationship with him, she took 69 pills at one time.  Husband had come from a trip in [place] and ignored her for one week.  That made her very upset and she ingested all the pills.  She claimed husband expected everything to be perfect.  This Christmas she was more depressed than usual.  She usually likes Christmas.  She was sleeping a lot and she had decreased sleeping.  She was crying a lot.  Therefore the stage was already being set for what happened yesterday.  PAST PSYCHIATRIC HISTORY:  Previous history of depression.  Sees Elna Breslow; last appointment a month ago.  Areta Haber is her therapist.  Sister died of a massive heart attack two years ago and felt that triggered the depression.  SUBSTANCE ABUSE HISTORY:  Denies the use or abuse of any substances.  PAST MEDICAL HISTORY: 1. Diabetes mellitus. 2. Crohns disease. 3. Arterial hypertension. 4. Decreased bilateral hearing.  MEDICATIONS ON ADMISSION: 1. Glyburide 2.5 mg daily. 2. Celexa 40 mg daily. 3. Prevacid 30 mg. 4. ______ one to two at bedtime.  MENTAL STATUS EXAMINATION ON ADMISSION:  Well-nourished, well-developed, alert, cooperative female, deaf but she reads lips clearly.  Mood was depressed.  Affect was depressed.  She said that it was  impulsive what she did.  Denies any active suicidal ideas.  Would be willing to stay through the next 24 hours.  Communication with the husband will be made to assure safety before making any recommendations but she felt she was doing well.  She had been feeling more depressed but she has follow up placed with Dr. Katrinka Blazing and Areta Haber.  Cognitive: Well preserved.  ADMITTING DIAGNOSES: Axis I:    Major depression, recurrent. Axis II:   No diagnosis. Axis III:  1. Decreased bilateral hearing.            2. Diabetes mellitus.            3. Crohns disease. Axis IV:   Moderate. Axis V:    Global assessment of functioning upon admission 45, highest global            assessment of functioning in the last year 65-70.  HOSPITAL COURSE:  She was admitted and started in intensive individual and group psychotherapy.  She was kept on her medications.  She was feeling better but had communication with the husband and he apparently told her that they were going to separate.  That upset her very much.  Then apparently he called back and said that were not.  So she felt comfortable staying with her sister for a while and then eventually working toward getting back together.  Upon  discharge in full contact with reality, mood improved, affect brighter, no suicidal or homicidal ideas.  Will stay with the sister.  Will continue to see Dr. Elna Breslow and Areta Haber and will eventually plan to be back together with the husband as their relationship improved.  DISCHARGE DIAGNOSES: Axis I:    Major depression, recurrent. Axis II:   No diagnosis. Axis III:  1. Decreased bilateral hearing.            2. Hypertension.            3. Diabetes mellitus.            4. Crohns disease. Axis IV:   Moderate. Axis V:    Global assessment of functioning upon discharge 60-65.  DISCHARGE MEDICATIONS: 1. Prevacid 30 mg twice a day. 2. Glyburide 2.5 mg twice a day. 3. Celexa 40 mg one and a half daily. 4.  Glucophage 500 mg two twice a day. 5. Vasotec 20 mg one twice a day. 6. ______ 15 mg one to two at bedtime. 7. Ativan 0.5 mg as needed for anxiety.  FOLLOWUP:  Elna Breslow and Areta Haber. Dictated by:   Reymundo Poll Dub Mikes, M.D. Attending Physician:  Hanley Seamen DD:  11/23/01 TD:  11/24/01 Job: [id number]

## 2011-02-20 NOTE — Procedures (Signed)
[facility]  Patient:    [name], [name]                           MRN: [id number] Proc. Date: 05/22/00 Attending:  Hedwig Morton. Juanda Chance, M.D. [facility]                           Procedure Report  PROCEDURE:  Upper endoscopy.  ENDOSCOPIST:  Hedwig Morton. Juanda Chance, M.D.  INDICATIONS:  This 65 year old white female with Crohns disease and previous resection of the terminal ileum has complained of epigastric pain, nausea, and unable to eat, and continued weight loss.  She has been on acid-suppressing agents, as well as pain medications.  She is undergoing an upper endoscopy to further evaluate her symptoms.  ENDOSCOPE:  Olympus single-channel video endoscope.  SEDATION:  Versed 9 mg IV, Demerol 75 mg IV.  FINDINGS/DESCRIPTION OF PROCEDURE:  The Olympus single-channel video endoscope as passed under direct vision through the posterior pharynx into the esophagus. The patient was monitored by pulse oximeter.  Her oxygen saturations were normal. he proximal, mid, and distal esophageal mucosa were unremarkable, with normal-appearing squamocolumnar junction.  STOMACH:  The stomach was insufflated with air and showed diffuse gastritis throughout the body and the gastric antrum.  There was a moderate amount of bile pooled along the greater curvature of the stomach.  The mucosa was erythematous and hyperemic.  A CLO test as well as biopsies for HNE stains were obtained from the gastric antrum.  The pyloric outlet was narrow, but I was able to advance into he duodenum.  The duodenal bulb and descending duodenum were normal.  The endoscope was then retracted and the stomach decompressed.  The patient tolerated the procedure well.  IMPRESSION: 1. Chronic gastritis, status post CLO test and biopsies. 2. Bile reflux. 3. Narrow pylorus, no significant obstruction.  PLAN: 1. Prevacid 30 mg q.d. 2. CT scan of the abdomen and pelvis, with attention to the pancreas, for  further evaluation of the weight loss. 3. Ultram 50 mg p.r.n. pain.  FOLLOWUP:  The patient will follow up with [place] in the office. DD:  05/22/00 TD:  05/22/00 Job: [id number]

## 2011-02-20 NOTE — H&P (Signed)
NAME:  [name], [name]                  ACCOUNT NO.:  [id number]   MEDICAL RECORD NO.:  [id number]            PATIENT TYPE:   LOCATION:                                 FACILITY:   PHYSICIAN:  [name], M.D.        DATE OF BIRTH:   DATE OF ADMISSION:  03/19/2006  DATE OF DISCHARGE:                                HISTORY & PHYSICAL   CHIEF COMPLAINT:  Infection.   PRESENT ILLNESS:  The patient is a 65 year old white female who has had  multiple abdominal surgeries and multiple drainages of abdominal infections  and has had an incisional hernia repaired with mesh on multiple occasions.  I recently saw her in the office with some pain and tenderness of the lower  abdomen and aspirated what felt like a fluid collection and there was thin  pus which grew out MRSA.  She is brought into the hospital for drainage and  possible excision of mesh.   PAST MEDICAL HISTORY:   OPERATIONS:  Include:  1.  Hysterectomy.  2.  Cholecystectomy.  3.  Colon tumor removed in 1986 and another in the 1970s.  4.  I believe her last open hernia repair and was 2003.  5.  She also has had left knee surgery.  6.  Cataract removed from the right eye.  7.  Trigger thumb fixed.  8.  Breast cysts removed.   1.  The patient has diabetes, type 2.  2.  She also has high blood pressure.  3.  She is known to have cirrhosis with splenomegaly and hypersplenism.  4.  She runs a chronically low platelet count.  5.  She fell a few days ago and injured her right forearm, and it is swollen      and tender; but x-ray was negative.  6.  The patient has some occasional problems with hemorrhoids not active at      this moment.  7.  She has a history of kidney stones in the past.  8.  She has a history of gout that has effected her hands.   The remainder of history is unremarkable.   THE PATIENT IS ALLERGIC TO TETRACYCLINE.   MEDICINES:  1.  Nexium 40 mg daily.  2.  Budeprion XL 300 mg daily.  3.  Fluoxetine 10 mg  daily.  4.  Enalapril 10 mg daily.  5.  Lexapro 10 mg daily.  6.  Morphine sulfate 15 mg by mouth as needed.  7.  Glimepiride 1 mg daily.  8.  Promethazine 25 mg as needed.  9.  Spironolactone 50 mg daily.  10. Colchicine 0.6 mg daily.   SHE IS ALLERGIC TO TETRACYCLINE.   She does not smoke or drink alcoholic beverages.   REVIEW OF SYSTEMS:  She denies so shortness of breath, chills, fever, chest  pain.  She says she does not have  decreased sensation in her extremities.  No history of vascular problems.   FAMILY HISTORY AND CHILDHOOD ILLNESSES:  Unremarkable.   PHYSICAL EXAMINATION:  The patient is moderately overweight.  VITAL SIGNS:  Normal per nursing staff.  Mental status is normal.  She is quite hard of  hearing the read lips very well and answers questions appropriately.  HEAD AND NECK: Otherwise unremarkable.  CHEST: Clear to auscultation.  HEART: Regular rhythm normal.  No murmur or gallop.  ABDOMEN: Multiple scars.  Tender fluctuant area in lower abdomen.  No  redness.  No hernia detectable this time.  GENITALIA: Externally normal otherwise not examined.  RECTAL: Not performed.  EXTREMITIES: Minimal edema.  Good pulses.  SKIN: No lesions are noted.  NEUROLOGICAL:  Grossly intact.   IMPRESSION:  1.  Infection of the abdominal wall, possibly infected mesh.  2.  Type 2 diabetes.  3.  Hypertension  4.  Gout.  5.  Depression and other mental issues.   PLAN:  Exploration of the abdominal wall with treatment of findings as  needed.      [name], M.D.  Electronically Signed     WB/MEDQ  D:  03/19/2006  T:  03/19/2006  Job:  [id number]

## 2011-02-20 NOTE — H&P (Signed)
[facility]  Patient:    [name], [name]                           MRN: [id number] Adm. Date:  11/21/00 Attending:  Lonzo Cloud. Kriste Basque, M.D. [facility] CC:         Corwin Levins, M.D. [facility]   History and Physical  DATE OF BIRTH:  1946-03-12  PATIENT PROFILE:  The patient is a 65 year old, white female, a hearing impaired, patient of Dr. Oliver Barre, with a history of diabetes and hypertension, who presents to the emergency room with a 24-hour history of pain and swelling in the left side of her face.  HISTORY OF PRESENT ILLNESS:  The patient is able to communicate verbally and can hear somewhat and reads lips.  About 24 hours ago, she developed some redness and swelling in the left side of her face.  This progressed and became quite sore with burning discomfort.  She noticed some chills but no fever or sweats.  The pain progressed and was quite tender in her left face and ear. It hurt to chew and to wash her hair.  She denied any history of sinusitis, without nasal congestion or discharge.  She denied any visual symptoms with blurry vision, double vision, etc.  Interestingly, she had a similar history with redness, swelling and discomfort on the right side of her face in November of last year, and was seen by Dr. Jonny Ruiz in the office, placed on Tequin with resolution of her symptoms.  She presented to the emergency room with these symptoms and was evaluated by Dr. Ethelda Chick, who thought initially she might have shingles or Ramsey Hunt syndrome, but on further evaluation including a CAT scan of her head, the findings are more compatible with facial cellulitis/erysipelas.  She is being admitted for IV antibiotic therapy and careful followup of this problem.  PAST MEDICAL HISTORY:  The patient has a history of diabetes mellitus and is on at least Glucophage, although she did not bring her medications to the emergency room and she is not sure of her medications.   She thinks that her level of control is good but she does not know her last hemoglobin A1c.  She has a history of hypertension and is taking an unknown blood pressure pill as well.  A blood pressure 140/90 here in the emergency room and she thinks her blood pressure control is good on this medication.  She has a history of Crohns disease and is followed by Dr. Lina Sar.  She has had extensive work-up in the past and had surgery with resection of the terminal ileum in 1990.  She has known irritable bowel syndrome and a history of colon polyps as well.  Her last colonoscopy in 1999 was apparently negative.  Her irritable bowel syndrome appears to be diarrhea predominant and she has occasional bouts of loose stool.  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  She does take Prevacid for the gastritis.  She has a history of depression in the past and is currently followed by Dr. Kyung Rudd and Dr. Katrinka Blazing on unknown antidepressant medication.  The husband has promised to bring her medications to the hospital tomorrow so we can have a full account of her outpatient meds.  She has a history of varicose veins and previous varicose vein stripping by  Dr. Marcy Panning last summer.  Her gynecologist is Dr. Basilia Jumbo, and she has had a recent evaluation and a history of breast cysts.  I should note that she indicates she was over medicated with her antidepressant medications in the past and had a hospitalization at [facility] last year for this.  FAMILY HISTORY:  The patients father died at age 72 from multiple pulse emboli.  The patients mother is alive at age 71, has a history of diabetes and is currently in a nursing home.  The patient had one brother and three sisters.  One sister died with myocardial infarction.  Her brother has hypertension and two sisters have diabetes and hypertension.  SOCIAL  HISTORY:  The patient has been married for the last 35 years and has one son, age 60, who is in good health.  She is currently on disability because of her Crohns disease.  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  She has a 12th grade education.  She is an ex-smoker having quit over 15 years ago.  No history of alcohol or drugs.  REVIEW OF SYSTEMS:  The patient denies cough or sputum production.  She denies shortness of breath.  She denies any history of heart problems and not had chest pain, palpitations or syncope.  She did have some head trauma with an episode of falling while she was over medicated during that hospitalization last summer at [facility].  She denies any recent problems with her gastritis or Crohns disease.  No history of GU problems.  As noted, her gynecologist, Dr. Basilia Jumbo has recently changed her hormone therapy.  No history of arthritic complaints, strokes, etc.  No history of arthritis complaints, joint swelling, etc.  No history of strokes or neurological problems.  PHYSICAL EXAMINATION:  GENERAL:  Physical examination reveals slightly overweight 65 year old white female in no acute distress.  VITAL SIGNS:  Blood pressure 140/90, pulse 90 per minute and regular, respirations 20 per minute and not labored, temperature 99 degrees.  HEENT:  Examination reveals redness with some tissue edema on the left side of her face involving the ear and malar area.  This is tender to palpation. Nasal mucosa appears okay and there is no drainage.  Throat is negative.  No lymphadenopathy.  CHEST:  Clear to percussion and auscultation.  CARDIAC:  Examination reveals a regular rhythm, grade 1/6 systolic ejection murmur left sternal border.  No rubs or gallops.  ABDOMEN:  Somewhat obese but soft and nontender without evidence of organomegaly or masses.  RECTAL:  Examination was deferred.  EXTREMITIES:  Show some venous insufficiency changes.   She has had previous varicose vein surgery.  She has trace edema.  No cyanosis or clubbing.   NEUROLOGICAL:  Examination is intact without focal abnormalities detected.  LABORATORY DATA:  Electrolytes are normal here in the emergency room.  Her sugar is 162, hemoglobin 10.2, hematocrit 31.7, white count 4100 with 63% segs.  CT scan of the maxillofacial area shows mild inflammatory changes over the left face compatible with cellulitis.  No evidence of abscess.  IMPRESSION:  Probable facial cellulitis/erysipelas on left face in a diabetic patient.  PLAN:  The plan is to admit her to the hospital for IV antibiotic therapy.  We will start Ancef 2 g IV q.6h. and watch her clinically.  She does not appear to have any blisters compatible with shingles at the present time, but need to keep this possibility in mind as she may  need antiviral therapy as well. DD:  11/21/00 TD:  11/21/00 Job: [id number]

## 2011-02-20 NOTE — Op Note (Signed)
NAME:  [name], [name]                  ACCOUNT NO.:  [id number]   MEDICAL RECORD NO.:  [id number]          PATIENT TYPE:  AMB   LOCATION:  SDS                          FACILITY:  [facility]   PHYSICIAN:  Lebron Conners, M.D.   DATE OF BIRTH:  10-21-1945   DATE OF PROCEDURE:  03/19/2006  DATE OF DISCHARGE:                                 OPERATIVE REPORT   PREOPERATIVE DIAGNOSIS:  Infection of the abdominal wall.   POSTOPERATIVE DIAGNOSES:  1.  Abdominal wall abscess.  2.  Infected polypropylene mesh.   OPERATION:  Drainage of abscess and excision of polypropylene mesh.   SURGEON:  Lebron Conners, M.D.   ANESTHESIA:  General.   COMPLICATIONS:  None.   BLOOD LOSS:  About 100 mL.   CONDITION TO POSTANESTHESIA CARE UNIT:  Good.   SPECIMEN:  Mesh and tissue, which were discarded.   PROCEDURE:  After the patient was monitored and asleep and had routine  preparation and draping of the abdomen, I opened the lower part of the  midline incision and dissected straight down into a fluid-filled cavity.  Fluid was very cloudy and obviously infected.  No cultures were collected,  since it had previously been cultured by aspiration.  I opened the wound  further and noted that there was a good deal of mesh, some of which was  relatively free within the cavity, but still attached to the abdominal wall.  I opened the wound widely enough to get at the mesh, making the total length  proximally 12 cm.  I debrided that some of the capsular wall of the abscess  and pulled out a lot of fibrinous material.  I then grasped the mesh and  dissected around it, cutting the sutures where it was attached.  It was  mostly loose, but in certain areas, seemed to be well-incorporated into the  abdominal wall.  In those areas, I very carefully cut through the scar with  the cautery or with the heavy scissors and got to the edges of the  mesh and removed it.  I then irrigated the cavity and removed the irrigant  and  got good hemostasis with cautery.  I packed the cavity with Kerlix  moistened in saline and applied a bulky, slightly compressive bandage.  The  patient was stable throughout the procedure.      Lebron Conners, M.D.  Electronically Signed     WB/MEDQ  D:  03/19/2006  T:  03/19/2006  Job:  [id number]   cc:   Georgina Quint. Plotnikov, M.D. [facility]  [address]  [place]  [place] [address]

## 2011-02-20 NOTE — H&P (Signed)
NAME:  [name], [name]                  ACCOUNT NO.:  [id number]   MEDICAL RECORD NO.:  [id number]          PATIENT TYPE:  INP   LOCATION:  2030                         FACILITY:  [facility]   PHYSICIAN:  Sean A. Everardo All, M.D. [facility] OF BIRTH:  1946/05/21   DATE OF ADMISSION:  05/05/2005  DATE OF DISCHARGE:                                HISTORY & PHYSICAL   REASON FOR ADMISSION:  Severe anemia.   HISTORY OF PRESENT ILLNESS:  A 65 year old woman with a history of Crohn's  disease with 1 week of generalized weakness, melena, and generalized  abdominal pain. She has associated falls, nausea, vomiting, and weight loss  of 20 pounds over the past month.   PAST MEDICAL HISTORY:  1.  Crohn's disease with cirrhosis.  2.  Type 2 diabetes.  3.  Depression.  4.  Chronic abdominal pain syndrome, apparently due to Crohn's disease.   MEDICATIONS:  1.  Phenergan.  2.  Metformin.  3.  Aldactone.  4.  Nexium.  5.  Wellbutrin.  6.  Ceftin.  7.  Morphine.   SOCIAL HISTORY:  The patient does not work outside the home. Her husband is  at the bed side.   FAMILY HISTORY:  Negative for GI bleed.   REVIEW OF SYSTEMS:  She has chronic dysuria and hematuria. She denies the  following:  Loss of consciousness, diarrhea, seizure, visual loss, chest  pain, shortness of breath, skin rash, excessive diaphoresis, sore throat,  fever and earache.   PHYSICAL EXAMINATION:  VITAL SIGNS:  Blood pressure is 98/42, heart rate is  98, respiratory rate is 22, temperature is 98.4.  GENERAL:  Appears chronically ill.  SKIN:  Pale, not diaphoretic.  HEENT: The back of the head is slightly tender. No periorbital swelling.  Pharynx - mucous membranes are dry.  NECK:  Supple.  CHEST:  Clear to auscultation.  CARDIOVASCULAR:  No jugular venous distension, no edema. Regular rate and  rhythm, no murmur. Pedal pulses are intact.  ABDOMEN:  Soft and slightly and diffusely tender. No hepatosplenomegaly, no  mass.  RECTAL  EXAM:  Per the emergency room physician is Hemoccult positive.  BREAST/GYN EXAM:  Not done at this time due to patient's condition.  EXTREMITIES:  No deformity is seen.  NEUROLOGIC:  Alert and oriented. Cranial nerves appear to be intact and she  readily moves all four extremities.   LABORATORY DATA:  Sodium 131, potassium 6.7, glucose 163, BUN is 116. CBC  remarkable for hemoglobin of 7.5.   IMPRESSION:  1.  Crohn's disease.  2.  Anemia due to GI bleed which is most likely associated with number one.  3.  Dehydration.  4.  Renal failure probably due to number three.  5.  Chronic dysuria, uncertain etiology.  6.  Hyperkalemia probably due to renal failure.   PLAN:  1.  Transfuse.  2.  Symptomatic therapy.  3.  Intravenous fluids.  4.  Check urinalysis.  5.  Recheck C-MET.  6.  Hold Glucophage.  7.  Will given p.r.n. Insulin.  8.  I discussed code status with  patient and she requests full code.       SAE/MEDQ  D:  05/06/2005  T:  05/06/2005  Job:  [id number]   cc:   Georgina Quint. Plotnikov, M.D. [facility]

## 2011-02-20 NOTE — Discharge Summary (Signed)
   NAME:  [name], [name]                            ACCOUNT NO.:  [id number]   MEDICAL RECORD NO.:  [id number]                   PATIENT TYPE:  INP   LOCATION:  5736                                 FACILITY:  [facility]   PHYSICIAN:  Lorre Munroe., M.D.            DATE OF BIRTH:  1946/02/09   DATE OF ADMISSION:  12/06/2002  DATE OF DISCHARGE:  12/13/2002                                 DISCHARGE SUMMARY   HISTORY OF PRESENT ILLNESS:  This is a 65 year old, white female who had  numerous difficulties with recurrent chronic abdominal wound and hernia.  She had undergone excision of infected mesh and had a nonhealing wound and  was admitted to the hospital for delayed closure of the wound.  The patient  was free of signs of infection and the wound was granulating well, but had  stopped healing.  She is diabetic.  She has chronic low white count.  She  also has high blood pressure.  See previous records for more details.   HOSPITAL COURSE:  On the day of admission, she underwent closure of the  abdominal wound with suction drainage and closure of the skin.  Postoperatively, she generally did well, but had some fever.  She had a  history of MRSA and was treated with vancomycin.  She made gradual  improvement and her fever went away.  She received transfusion of blood  because her hemoglobin was 7.8 and that actually helped her feel better.  She had a PICC line placed for outpatient antibiotic therapy.  She was sent  home with drain in place and staples in place without external sign of  infection at the time.  Arrangements were made for short-term followup in  the office and for home health to administer her care.   DISCHARGE DIAGNOSES:  1. Chronic, nonhealing abdominal wound.  2. Diabetes.  3. Hypertension.   PROCEDURE:  Secondary closure of abdominal wound.   CONDITION ON DISCHARGE:  Improved.                                               Lorre Munroe., M.D.    Jodi Marble  D:   02/06/2003  T:  02/07/2003  Job:  [id number]

## 2011-02-20 NOTE — Consult Note (Signed)
NAME:  [name], [name]                  ACCOUNT NO.:  [id number]   MEDICAL RECORD NO.:  [id number]          PATIENT TYPE:  REC   LOCATION:  FOOT                         [facility]AC[facility]   PHYSICIAN:  Jonelle Sports. Sevier, M.D. DATE OF BIRTH:  Sep 27, 1946   DATE OF CONSULTATION:  07/24/2004  DATE OF DISCHARGE:                                   CONSULTATION   HISTORY:  This 65 year old white female is seen at the courtesy of Dr.  Posey Vangorden for assistance with management of some chronic minor ulcerations  of the left lower extremity.   The patient has been obese for a number of years and has type 2 diabetes  which despite her limited compliance with diet is apparently in good control  with A1c of 5.6.  She also has Crohn's disease and has had several previous  surgical procedures in conjunction with that.  She is not on any steroids or  antimetabolic type drugs at this time, her Crohn's apparently is fairly  quiet and is controlled simply with antispasmodic medications, and she has  not in the past had anything that sounds, by description, likely to have  been pyoderma gangrenosum.   With that background history, the patient began with several small punctate  weeping ulcerated areas on the distal anterior aspect of the left lower  extremity about a year ago.  These have never had any specific treatment.  She has generally just covered them with a dry gauze until she recently saw  Dr. Posey Kitner who gave her triamcinolone cream to put on the area with  apparently some relief since that time.  Nonetheless, the ulcers persist,  and she is here today for further evaluation and advice.   PAST MEDICAL HISTORY:  Notable primarily for those things previously  indicated.   She is said to be ALLERGIC TO TETRACYCLINE.   Her regular medications include:  Metformin, Nexium, glyburide, diphenoxylate with atropine, citalopram,  enalapril and occasional Benadryl.   EXAMINATION:  Examination today is  limited to the distal lower extremities.  The extremities are mildly edematous and this is quite symmetrical, not  pitting in nature.  She does have prominent superficial veins but no ropey  varicosities and no clinical suspicion of thrombophlebitis.  Her skin  temperatures are high normal and reasonably symmetrical, although a bit  higher on the left than on the right.  Pulses are quite adequate at all  locations.  Monofilament testing shows that she has protective sensation  throughout.  The feet are free of significant lesions, although she does  have a small callous at the interphalangeal joint of the right hallux.   On the left lower extremity in the distal pretibial area are 4 or 5 tiny  superficial punched out areas with evidence of some recent bleeding perhaps  secondary to excoriation.  There is not substantial hemosiderin staining or  other evidence of status dermatitis in that area.  There is no evidence of  any significant deep secondary infection.   IMPRESSION:  In all likelihood, these are minor stasis ulcerations perhaps  aggravated by some  degree of excoriation.  I am not suspicious that they are  a specific skin affliction associated with her inflammatory bowel disease  nor, fortunately, do we have to contend with steroid therapy or  antimetabolite therapy in addressing these issues.   DISPOSITION:  1.  The patient is given instruction regarding foot care and diabetes by      video with some nurse reinforcement.  2.  The area on the legs is cleansed and it appears no significant      debridement is indicated.  3.  The open lesions and surrounding area will be treated with an      application of Silvadene, and that extremity will then be placed in a      Unna wrap, held in place with external Coban.  4.  It is discussed with the patient that once these are healed we will      likely give her some Silvadene cream to use early should they recur, but      also that we  will likely recommend she go into some degree of      compression hose.  5.  Followup visit will be at this clinic in 6 days.       RES/MEDQ  D:  07/24/2004  T:  07/24/2004  Job:  [id number]   cc:   Georgina Quint. Plotnikov, M.D. [facility]

## 2011-02-20 NOTE — H&P (Signed)
NAME:  [name], [name]                  ACCOUNT NO.:  [id number]   MEDICAL RECORD NO.:  [id number]          PATIENT TYPE:  EMS   LOCATION:  MAJO                         FACILITY:  [facility]   PHYSICIAN:  [name], M.D. LHCDATE OF BIRTH:  [birth year]   DATE OF ADMISSION:  04/07/2006  DATE OF DISCHARGE:                                HISTORY & PHYSICAL   CHIEF COMPLAINT:  Right leg swelling and pain.   HISTORY OF PRESENT ILLNESS:  The patient is a 65 year old white female with  multiple medical problems.  She is status post multiple abdominal surgeries  for abdominal wall wound infections, she is status post drainage of an  abdominal wall abscess and infected mesh on March 19, 2006.  She is followed  daily by [facility] for wound care.  Apparently for the past week,  she has had some right greater than left leg edema.  It has worsened over  the past 24 hours associated with more pain and erythema.  The patient [facility]  [facility] where a D-dimer was  approximately twice the upper limits of normal.  A venous Doppler evaluation  is pending.  The patient is now admitted for further evaluation and  treatment of suspected right lower extremity deep vein thrombosis.   PAST MEDICAL HISTORY:  1.  History of multiple abdominal surgeries with recurrent infections and      recurrent surgery for recurrent hernias.  2.  Type 2 diabetes.  3.  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   OTHER PAST MEDICAL HISTORY:  1.  Hypertension.  2.  Renal stones.  3.  History of gout.  4.  Depression.  5.  Remote history of GI bleeding secondary to duodenal ulcer.  6.  Crohn's colitis.   PAST SURGICAL HISTORY:  1.  Hysterectomy.  2.  Cholecystectomy.  3.  Colon tumors resected in the 1970s and also in 1986.  4.  Multiple abdominal wall hernia repairs, the last in 2003.   OTHER SURGICAL PROCEDURES:  1.  Left knee surgery.  2.  Cataract  surgery involving the right eye.  3.  Trigger finger repair.  4.  Surgery for benign breast cysts.   PRESENT MEDICAL REGIMEN:  1.  Lexapro 10 mg daily.  2.  Nexium 40 mg daily.  3.  Aldactone 50 mg b.i.d.  4.  Amaryl 1 mg daily.  5.  Enalapril 10 mg daily.  6.  Phenergan 25 mg every 6 hours p.r.n. nausea.  7.  Wellbutrin-XL 150 mg daily.  8.  Colchicine 0.6 mg p.r.n. gout.   SOCIAL HISTORY:  The patient lives with her husband.  No tobacco or alcohol  use.   ALLERGIES:  1.  TETRACYCLINE.  2.  KETEK.   FAMILY HISTORY:  Noncontributory.   PHYSICAL EXAMINATION:  GENERAL:  An overweight white female in no acute  distress.  VITAL SIGNS:  Stable.  Blood pressure 130/72.  HEAD AND NECK:  Normal pupillary responses, conjunctivae are clear.  There  is a small, erythematous papule involving the  left helix.  ENT otherwise  negative.  The patient was edentulous.  Neck - no bruits or adenopathy.  CHEST:  Clear.  CARDIOVASCULAR:  Normal S1 and S2.  No murmur or gallop.  ABDOMEN:  Marked splenomegaly.  There is no hepatic enlargement.  She had a  lower midline incision that was packed with gauze.  There is no significant  drainage, and the incision appeared to be reasonably clean.  EXTREMITIES:  Intact peripheral pulses.  There is +1 left leg edema  distally.  There is marked edema involving the right lower extremity distal  to the knee.  This was associated with pain and excess of erythema and  warmth.   IMPRESSION:  1.  Right lower extremity deep vein thrombosis.  2.  Multiple medical problems including diabetes, cirrhosis, and      pancytopenia.  3.  Abdominal wound infections.   DISPOSITION:  The patient will be admitted to the hospital.  She will be  placed on Lovenox and Coumadin via pharmacy protocol.  Will consent her  early discharge when stable for post followup with [facility].           ______________________________  [name], M.D. [facility]      PFK/MEDQ  D:  04/07/2006  T:  04/07/2006  Job:  [id number]

## 2011-02-20 NOTE — Discharge Summary (Signed)
[facility]  Patient:    [name], [name]                         MRN: [id number] Adm. Date:  [id number] Disc. Date: [id number] Attending:  Corwin Levins Dictator:   Cornell Barman, P.A. CC:         Sonda Primes, M.D. [facility]                           Discharge Summary  DISCHARGE DIAGNOSES:  1. Facial cellulitis.  2. Pancytopenia.  3. Non-insulin-dependent diabetes mellitus.  4. Elevated sed rate.  BRIEF HISTORY:  [name] is a 65 year old white female who was hearing impaired. She presented with cellulitis to the right face.  PAST MEDICAL HISTORY:  1. Diabetes mellitus.  2. Hypertension.  3. History of Crohns disease.  4. Surgical resection of the terminal ileum in 1990.  5. Irritable bowel syndrome.  6. History of colon polyps, last colonoscopy was in 1999 and was negative.     Last endoscopy in 1999 revealed gastritis and ERCP revealed periampullar     diverticulum.  7. Depression.  8. History of breast cyst.  HOSPITAL COURSE BY PROBLEM:  #1 - ID.  The patient presented with probable facial cellulitis erysipelas in a diabetic patient. She had started on Ancef. The patient was also ntoed ot have some pancytopenia. The patients condition improved. The patients IgA was elevated at 679. IgG was elevated at 1920 and IgM was low at 56. After five days of antibiotics, the patient was felt to be stable for discharge home.  #2 - NON-INSULIN-DEPENDENT DIABETES MELLITUS.  Blood sugars were consistently in the 200s. Hemoglobin A1C was 7.7%.  DISCHARGE LABORATORY DATA:  White count was 3.3, hemoglobin 9.3, hematocrit 28.3, platelet count was 82, 1.3% retic with ______ count of 46.8, sed rate of 59, hemoglobin A1C was 7.7, LFTs were normal. TSH was 1.143, B12 374. Urinalysis was negative. Blood cultures are negative, ANA was negative.  DISCHARGE MEDICATIONS:  1. Prevacid 30 mg b.i.d.  2. ______ 20 mg q.d.  3. Glucophage 500 mg b.i.d.  4. Celexa 20 mg  q.d.  FOLLOW-UP:  Dr. Debby Bud. DD:  03/02/01 TD:  03/02/01 Job: [id number]

## 2011-02-20 NOTE — Discharge Summary (Signed)
[place]  Patient:    [name], [name]                         MRN: [id number] Adm. Date:  [id number] Disc. Date: [id number] Attending:  Dolores Patty CC:         Sonda Primes, M.D. [facility]                           Discharge Summary  ADMITTING DIAGNOSIS: Chest pain and abdominal pain, rule out pulmonary embolism, myocardial infarction, or thoracic aneurysm.  DISCHARGE DIAGNOSIS: Chest pain secondary to esophageal reflux and esophageal spasm.  HISTORY OF PRESENT ILLNESS: Ms. [name] is a 65 year old white female diabetic with a long history of Crohns disease for which she has had surgery on five occasions.  She has a chronic pain syndrome for which she takes oxycodone. She had been previously evaluated for chest pain in April 1999 and catheterization was negative at that time.  Significantly, a sister died in Feb 09, 2001with a massive myocardial infarction.  She presents with chest pain with radiation to the back.  Additionally, clinically she had abdominal pain and tenderness with acute on chronic changes.  HOSPITAL COURSE: She was admitted to telemetry and received parenteral pain medications.  Serial cardiac enzymes included CPK and MB and troponin I, all of which were negative for cardiac damage.  Amylase and lipase were also normal.  Her potassium was mildly decreased at 3.4 despite being on Prinivil.  Hematocrit was mildly reduced at 35.6. Pending at the time of this dictation is Helicobacter pylori serology.  Although she is diabetic her admission glucose was 127.  Serial glucoses were in the range of 130-170.  Sliding-scale coverage had been ordered and Glucophage was held at the time of admission.  Additionally, her Premarin was held until a spiral CT could rule out PE.  The spiral CT showed no evidence of aneurysm or PE.  Her hospital course was marked by an episode of chest pain at 4 oclock, for which she received  nitroglycerin with relief.  She had been on Prevacid 30 mg b.i.d. and was changed to Protonix 40 mg b.i.d. during hospitalization.  She noted burning reflux symptoms were markedly improved.  On the morning of discharge she was afebrile.  Blood pressure was 151/90, pulse was 90.  She had no dysrhythmias on telemetry.  Oxygen saturations were 94% on room air.  The chest was clear.  Bowel sounds were active.  The findings were explained to her in detail.  I reviewed all laboratories and the CT findings with her.  DISCHARGE MEDICATIONS:  1. Potassium chloride 20 mg q.d.  2. She was to continue Lasix 20 mg q.d.  3. The Prevacid was changed to Protonix 40 mg b.i.d.  As stated, Helicobacter     pylori is pending and if this is positive then definitive therapy should     be pursued.  4. There were no other changes in her medications.  FOLLOW-UP:  She is to see Dr. Posey Mcquitty in ten to 14 days.  She was asked to have her potassium rechecked when seen by Dr. Posey Campanelli.  He will be asked to follow up on Helicobacter pylori serologies.  A nuclear stress test could be pursued if her chest symptoms persist despite the negative work-up here and previous negative catheterization in April 1999.  DISCHARGE STATUS: Improved.  PROGNOSIS: Good at this  time.  DISCHARGE DIET: Diabetic with no-added salt. DD:  02/01/00 TD:  02/02/00 Job: [id number]

## 2011-02-20 NOTE — H&P (Signed)
[facility]  Patient:    [name], [name] Visit Number: [id number] MRN: [id number]          Service Type: PSY Location: 500 [id number] 01 Attending Physician:  Rachael Fee Dictated by:   Reymundo Poll Dub Mikes, M.D. Admit Date:  10/09/2001                     Psychiatric Admission Assessment  CHIEF COMPLAINT AND PRESENTING ILLNESS:  This is the first admission to [facility] for this 65 year old female who was admitted after she took an overdose of Tylenol.  HISTORY OF PRESENT ILLNESS:  The patient admits that she and her husband get into fight.  This time around, she got into a fight, she got very upset, she took on overdose of Tylenol to show him.  Last year, apparently responding to friction in the relationship with him, she took 69 pills at one time.  Husband had come from a trip in [place] and ignored her for 1 week.  That made her very upset and she ingested all the pills.  She claimed husband expects everything to be perfect.  This Christmas, she was more depressed than usual. she usually likes Christmas, but she was sleeping a lot.  Then she had decreased sleeping, she was crying a lot therefore the stage was already being set for what happened yesterday.  PAST PSYCHIATRIC HISTORY:  Has had a 2-year history of depression.  She started to see Elna Breslow, last appointment a couple of months ago.  Areta Haber is her therapist.  Her sister died of a massive heart attack 2 years ago and she feels that the trigger for the depression.  SOCIAL HISTORY:  Lives with husband.  Has a 49 year old son.  Has been married 35 years.  Moved to [place] from [place] and she has not worked since then.  FAMILY HISTORY:  One brother has depression.  ALCOHOL AND DRUG HISTORY:  Denies the use or abuse of any substances.  MEDICAL HISTORY:  Diabetes and Crohns disease, and arterial hypertension.  MEDICATIONS UPON ADMISSION: 1. Glyburide 2.5 daily. 2.  Celexa 40 mg daily. 3. Prevacid 30 mg twice a day. 4. Dalmane 50 1-2 at bedtime as needed for sleep.  DRUG ALLERGIES:  Denies any drug allergies.  PHYSICAL EXAMINATION:  Performed at the emergency room, failed to show any active findings.  She sees Dr. Lina Sar for followup.  MENTAL STATUS EXAMINATION:  Reveals a well-nourished, well-developed, alert, cooperative female.  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  Her mood is of depression.  Her affect is of depression.  She says that it was impulsive what she did, denies any active suicidal ideation.  Will be willing to stay through the next 24 hours.  Communication with the husband will be made to assure safety before making any recommendation, but she feels that she is doing well. She does admit she has been feeling more depressed, but she has the followup in place with Dr. Katrinka Blazing and Areta Haber.  She is well oriented to person, place and time.  Recent and remote memories are well preserved.  There is no evidence of delusional ideas.  ADMITTING  DIAGNOSES: Axis I:    Major depression, recurrent. Axis II:   No diagnosis. Axis III:  Decreased bilateral hearing, hypertension, diabetes            mellitus, and Crohns disease. Axis IV:   Moderate. Axis V:    Global  assessment of function upon admission 45-50, highest            global assessment of function in past year 65.  PLAN:  We are going to go ahead and keep her on the same medications, observe her during the next 24 hours, and if she continues to be stable and we get a positive report from the husband, we are going to discharge her to outpatient followup. Dictated by:   Reymundo Poll Dub Mikes, M.D. Attending Physician:  Rachael Fee DD:  10/09/01 TD:  10/09/01 Job: [id number]

## 2011-02-20 NOTE — Discharge Summary (Signed)
   NAME:  [name], [name]                            ACCOUNT NO.:  [id number]   MEDICAL RECORD NO.:  [id number]                   PATIENT TYPE:  INP   LOCATION:  5710                                 FACILITY:  [facility]   PHYSICIAN:  Lorre Munroe., M.D.            DATE OF BIRTH:  [birth year]   DATE OF ADMISSION:  08/04/2002  DATE OF DISCHARGE:  08/08/2002                                 DISCHARGE SUMMARY   PREOPERATIVE DIAGNOSIS:  Chronic wound infection.   POSTOPERATIVE DIAGNOSIS:  Chronic wound infection.   OPERATION:  Excision of infected polypropylene mesh.   DISCHARGE CONDITION:  Improved.   CLINICAL HISTORY AND HOSPITAL COURSE:  The patient is a 65 year old white  female with a nonhealing abdominal wound containing only polypropylene mesh  used in incisional hernia repair.  It had become infected and various  methods had been tried to attempt to get this to heal.  The healing had  become very slow and the mesh was not incorporating well into the wound and  I decided, with the patient's permission, that it was best to remove it.  She was brought to the hospital and that took place on the day of admission,  August 04, 2002.  Postoperative course was marked at one point by  hypoglycemia due to her diabetes, but the patient generally improved and  arrangements were made for care of the wound at home.  She was feeling well  at the time of discharge.  She was to see me in the office in about a week  for followup.  Wound was being treated by open methods.                                               Lorre Munroe., M.D.    WB/MEDQ  D:  09/14/2002  T:  09/15/2002  Job:  [id number]

## 2011-02-20 NOTE — Consult Note (Signed)
NAME:  [name], [name]                  ACCOUNT NO.:  [id number]   MEDICAL RECORD NO.:  [id number]          PATIENT TYPE:  EMS   LOCATION:  MAJO                         FACILITY:  [facility]   PHYSICIAN:  [name], M.D. LHCDATE OF BIRTH:  Feb 21, 1946   DATE OF CONSULTATION:  12/16/2004  DATE OF DISCHARGE:  12/16/2004                                   CONSULTATION   CHIEF COMPLAINT:  Chest pain.   HISTORY OF PRESENT ILLNESS:  The patient is a 65 year old white female who  is recently discharged from the hospital eight days ago for evaluation and  treatment of anemia. She has multiple medical problems. She presents with a  chief complaint of chest pain for three to four days. She describes this as  both sharp and also a pressure sensation in the left anterior chest area. It  radiates through the back and has been associated with intermittent periods  of diaphoresis. She has a long history of intermittent chest pain. She  states that she had a Cardiolite stress test two years ago with Dr. Charlies Constable. Review of the hospital records reveal similar presentations over the  years. Did have a negative heart catheterization in 1999.  She also states  that she has fallen on several occasions recently.  She also complains of  pain in the upper back area bilaterally.   LABORATORY DATA:  ED evaluation included an EKG that was unremarkable and  unchanged and some minor nonspecific ST and T-wave changes. Cardiac enzymes  were normal. A d-dimer test was positive. A CT scan of the chest revealed no  evidence of embolic disease.   PAST MEDICAL HISTORY:  1.  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  2.  She has a history of gastritis and esophagitis.  3.  Crohn's disease.  4.  Diabetes.  5.  Chronic back pain.  6.  Hypertension.  7.  Depression.  8.  History of obesity.   MEDICATIONS:  Zantac, Enalapril, Nexium,  Celexa, Glyburide, Glucophage. She  states she takes Vicodin not oxycodone.   FAMILY HISTORY:  Noncontributory. Father died at 65 of an apparent pulmonary  embolus following surgery. Mother died in her 48s of congestive heart  failure and had diabetes.   PHYSICAL EXAMINATION:  GENERAL:  Elderly white female who appeared much  older than her stated age.  VITAL SIGNS:  Blood pressure and vital signs were stable.  SKIN:  Warm and dry without rash.  HEENT:  Normal fundi. Anicteric. Ears, nose, and throat unremarkable.  NECK:  No neck vein distention.  CHEST:  Bibasilar rales.  CARDIOVASCULAR:  Normal S1 and S2 without murmurs. She did have tenderness  over the sternal area and left anterior chest that did reproduce her pain.  Palpation over the upper back also elicited pain.  ABDOMEN:  Multiple surgical scars. Prominent splenomegaly was noted. She was  diffusely tender with some mild guarding.  EXTREMITIES:  Some ecchymoses.  Peripheral pulses were full.   IMPRESSION:  1.  Chest wall pain.  2.  Multiple medical problems.   DISPOSITION:  Options were discussed with the patient including hospital  admission and observation and consideration for repeat stress test. She  wishes to pursue outpatient evaluation. She will be discharged from the  emergency room to follow up with Dr. Georgina Quint. Plotnikov tomorrow who will  consider a repeat Cardiolite stress test. She will be discharged on her  preadmission regimen, a prescription for Vicodin #50 dispensed.      PFK/MEDQ  D:  12/16/2004  T:  12/17/2004  Job:  [id number]

## 2011-02-20 NOTE — H&P (Signed)
   NAME:  Mckenna, Cloa                              ACCOUNT NO.:  [id number]   MEDICAL RECORD NO.:  [id number]                   PATIENT TYPE:   LOCATION:                                       FACILITY:   PHYSICIAN:  Marta Lamas. Gae Bon, M.D.            DATE OF BIRTH:   DATE OF ADMISSION:  DATE OF DISCHARGE:                                HISTORY & PHYSICAL   CHIEF COMPLAINT:  The patient is a non-insulin diabetic with an infected  seroma of the abdominal wall.   HISTORY OF PRESENT ILLNESS:  The patient had debridement of an abdominal  wall infected ventral hernia debrided back in June of 2003.  She was doing  well approximately a week ago when she was seen by Dr. Orson Slick in clinic on  05/09/2002.  However, at that time was noted to have a wound seroma but was  afebrile.  She refused to have it drained at that time but comes in today  with a fever of 101.7, a pulse of 120, a tense, red and warm abdominal wall  seroma which obviously appears to be infected.  Upon aspirating 560 cc of  turbid fluid, it was found to definitely be infected.  She now is being  admitted for IV antibiotics.   The patient is a non-insulin diabetic, taking Amaryl, Glucophage for her  diabetes.  She also takes Paxil, Premarin, Prevacid, Celebrex, Vicodin and  Lasix along with an anti-fungal ointment.   PHYSICAL EXAMINATION:  VITAL SIGNS:  She is febrile up to 101.7, pulse is  120, blood pressure 180/100, respirations are 24.  HEENT:  She is normocephalic, atraumatic.  GENERAL:  She does appear to be in acute distress.  ABDOMEN:  Her abdominal wall is very tense and red and blanches in area.  We  were able to aspirate approximately 1/2 L of turbid, serous fluid from her  abdominal wall wound.   IMPRESSION:  She has an infected seroma of her abdominal wall and now comes  in for IV antibiotics and possible reexploration and debridement.   DISPOSITION:  She will be admitted and if she fails to respond to  antibiotics, will likely need surgical debridement.  She will remain with an  oral diet at 2000 ADA-diet now.                                                Marta Lamas. Gae Bon, M.D.    JOW/MEDQ  D:  05/15/2002  T:  05/18/2002  Job:  [id number]   cc:   Skeet Simmer., M.D.  Fax: [phone]   Wonda Cheng

## 2011-02-20 NOTE — Consult Note (Signed)
[place]  Patient:    [name], [name]                         MRN: [id number] Proc. Date: 04/26/00 Adm. Date:  [id number] Attending:  Annamarie Dawley CC:         Nicoletta Dress. Colon Branch, M.D.             Sonda Primes, M.D. [facility]             Dora M. Juanda Chance, M.D. [facility]             Fortunato Curling, M.D.                          Consultation Report  REQUESTING PHYSICIAN:  Nicoletta Dress. Colon Branch, M.D.  CHIEF COMPLAINT:  "Tired."  HISTORY OF PRESENT ILLNESS:  [name] is a 65 year old deaf female who is brought into the emergency room by her family.  Her family is concerned because of a gait abnormality.  Apparently, several times since January (date of starting Zoloft), she has had intermittent episodes of confusion, unsteady gait, and falling.  This often is associated with slurred speech.  This time, she apparently developed these symptoms late last night characterized by typical unsteady gait, intermittently confused, slurred speech, and strained speech content.  This was not associated with any obvious muscle weakness. The patient states that she does not remember these events.  The patient and daughter-in-law admit that there has been a great deal of confusion about her medications.  A great deal of time was spent today in the emergency room doing pill counts.  Currently, the patient is feeling better.  She is ambulating without difficulty.  Her speech is normal for her, and she is no longer confused.  She denies headache, neurologic changes, muscular weakness.  She denies any visual disturbance.  PAST MEDICAL HISTORY:  Significant for diabetes, anxiety, depression, Crohns disease, and hypertension.  She has had multiple intestinal surgeries for Crohns disease as well as a cholecystectomy, breast cyst that was removed, and a hysterectomy.  CURRENT MEDICATIONS:  Xanax 0.5 to 1 mg p.o. t.i.d., Vicodin 7.5 mg one p.o. q 6 hours p.r.n., Premarin 0.625 mg p.o.  q day, Prinivil 10 mg p.o. q day, K-Dur 20 mEq p.o. q day, Lasix (empty bottle), Zoloft 50 mg p.o. q day, Effexor 75 mg p.o. q day, Glucophage 500 mg p.o. b.i.d.  When pill counts were done, it appears that she has markedly overused her Xanax and Vicodin.  It appears that in the last seven to eight days, she has used 30-40 more Xanax than prescribed and also 20 more Vicodin than prescribed.  She has underused Zoloft by at least 20 tablets.  She has not used any Effexor recently, as far as pill counts go, but her other medications looked appropriate.  SOCIAL HISTORY:  She lives with her husband.  Her son and daughter-in-law live next door.  She does not smoke and does not drink alcohol.  REVIEW OF SYSTEMS:  She denies any chest pain, shortness of breath, abdominal pain, dysuria, or any other complaints in the Review of Systems other than those listed above.  PHYSICAL EXAMINATION:  VITAL SIGNS:  Blood pressure 155-180/85-96, respirations 16-20.  Pulse is 90. Pulse oximetry is 93% on room air.  Her temperature is 97.4.  GENERAL:  She appears as a well-developed, well-nourished female in no acute  distress.  HEENT:  Atraumatic, normocephalic.  Pupils are equal, round and reactive to light normally.  Extraocular muscles are intact.  Sclera are white; conjunctiva are pink.  NECK:  Supple without any lymphadenopathy, thyromegaly, jugular venous distention or carotid bruits.  CHEST:  There is no increased work of breathing, no dullness to percussion.  CARDIAC EXAM:  S1 and S2 are normal.  PMI is normal.  ABDOMEN:  Soft, nontender, nondistended.  No hepatosplenomegaly.  EXTREMITIES:  There is no clubbing, cyanosis, or edema.  NEUROLOGIC:  She is alert and oriented.  Her gait is normal.  Motor and sensory exams are normal.  LABORATORY VALUES:  Hemoglobin 10.6; platelet count 73,000; white blood cell count 4.4; sodium 141; potassium 3.2; chloride 106; albumin 14; glucose 174; pH  7.409;  PCO2 35.6.  CT of the head was done and demonstrates no acute disease.  EKG demonstrates normal sinus rhythm, poor R wave progression in V1 through V3; otherwise unremarkable.  ASSESSMENT AND PLAN: 1. Intermittent confusion.  I think the most likely thing is clearly    medication confusion.  This was discussed in detail with her and her    family.  The plan now is for the patient to have all her medications    distributed by her daughter-in-law.  For the time being, she can use    Xanax 0.5 to 1 mg p.o. t.i.d.  I think, in the long run, it would be best    if she does not use Xanax as I do not think she is capable of administering    it herself.  Vicodin, I advised one-half to one pill q 6 hours p.r.n.    This should not be used at the same time as Xanax.  I really think [name]    was using these medications to mask her anxiety and depression.  [name] is    set up to see Dr. Elna Breslow in the near future.  [name] will continue on    Zoloft 25 mg p.o. q day (will start at very low dose).  She will not use    her Effexor, but her daughter-in-law will keep it in case that can be of    any use in the future. 2. Other medical problems include a platelet count of 73,000.  This needs    further evaluation but does not need to be seen in the hospital for that.    Her anemia is apparently known to Dr. Juanda Chance, and the patient is scheduled    to undergo endoscopy in a few days.  DD:  04/26/00 TD:  04/26/00 Job: [id number] [id number]

## 2011-02-20 NOTE — H&P (Signed)
NAME:  [name], [name]                  ACCOUNT NO.:  [id number]   MEDICAL RECORD NO.:  [id number]          PATIENT TYPE:  EMS   LOCATION:  MAJO                         FACILITY:  [facility]   PHYSICIAN:  Thomos Lemons, D.O. [facility]   DATE OF BIRTH:  1945/12/23   DATE OF ADMISSION:  06/02/2005  DATE OF DISCHARGE:                                HISTORY & PHYSICAL   PRIMARY CARE PHYSICIAN:  Georgina Quint. Plotnikov, M.D.   CHIEF COMPLAINT:  Severe abdominal pain.   HISTORY OF PRESENT ILLNESS:  The patient is a complicated 66 year old white  female with history of Crohn's disease, chronic abdominal pain, history of  infected mesh secondary to hernia repair, and NASH cirrhosis, who presents  with persistent, severe abdominal pain, worsening over the last one week.  The patient was recently hospitalized for lower extremity edema and  discharged approximately one week ago.  The patient notes that she had  developed severe abdominal pain, mainly in the right lower quadrant, 10/10  in severity that worsened with movement.  The patient denies any nausea or  vomiting.  No diarrhea, no constipation.  Denies any recent fevers or  chills, but patient just has been unable to get any relief.  Patient is  accompanied by a family member today who notes that patient is crying in  pain daily.  The patient denies any recent urinary symptoms.   PAST MEDICAL HISTORY:  1.  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  2.  Duodenal ulcers per endoscopy performed May 07, 2005.  3.  History of pancytopenia.  4.  Type 2 diabetes.  5.  History of Crohn's disease.  6.  History of hernia repair with an infected mesh with Dr. Orson Slick in the      past.  Please note that when she was hospitalized in August of this      year, Dr. Johna Sheriff evaluated the patient and apparently she had hernia      repair by Dr. Orson Slick in 2003.  This mesh subsequently became infected      and required removal.  She then required abdominal wall fluid collection  removal and underwent incision and debridement and drainage of her      abdominal wall fluid collection in June 2004.  The patient has a long      history of chronic abdominal pain.  7.  Depression.  8.  History of hepatic encephalopathy.   CURRENT MEDICATIONS:  1.  Wellbutrin 150 mg once daily.  2.  Nexium 40 mg once daily.  3.  Aldactone 50 mg b.i.d.  4.  Multivitamins once a day.  5.  Lactulose 30 mL p.o. daily.  6.  Phenergan 25 mg q.6h. p.r.n.  7.  Iron tablets daily.  8.  Amaryl 1 mg daily.  9.  Morphine p.r.n.   ALLERGIES:  TETRACYCLINE and also KETEK.   SOCIAL HISTORY:  Lives with husband.  No tobacco or alcohol use.   FAMILY HISTORY:  Noncontributory.   REVIEW OF SYSTEMS:  As above, all other systems negative.   PHYSICAL EXAMINATION:  VITAL SIGNS: Weight 186 pounds.  Temperature 98.4,  pulse 101, blood pressure 104/70.  GENERAL:  The patient is somewhat overweight, 65 year old white female in  acute distress.  HEENT:  Normocephalic and atraumatic.  Extraocular movements intact.  Pupils  equal, round, and reactive to light bilaterally.  The patient was anicteric.  Conjunctivae were within normal limits. Oropharynx was free of any lesions.  Auditory canals and tympanic membranes were clear bilaterally.  NECK:  Supple.  No adenopathy or thyromegaly.  RESPIRATORY:  Normal respiratory effort.  Chest was clear to auscultation  bilaterally.  CARDIOVASCULAR:  Regular rate and rhythm.  The patient was tachycardic, but  no significant murmurs, rubs, or gallops appreciated.  ABDOMEN:  Diffuse tenderness but specifically in the right lower quadrant.  The patient did not have any guarding, however, and no rebound tenderness.  MUSCULOSKELETAL:  No clubbing, cyanosis, or edema.  SKIN:  Warm and dry.  NEUROLOGIC:  Awake, alert, oriented x3.   ASSESSMENT AND PLAN:  1.  Worsening abdominal pain in 65 year old with history of chronic      abdominal pain.  2.  History of Crohn's  disease.  3.  Type 2 diabetes.  4.  History of pancytopenia.  5.  History of depression.   RECOMMENDATIONS:  This is a difficult situation.  The patient has a history  of chronic abdominal pain with unclear etiology.  The patient most recently  had a CT scan of her abdomen performed on May 27, 2005, which shows  multiple fluid collections in the abdominal wall which appear stable;  however, radiologist noted some interval increase in inflammatory edematous  changes in the mesenteric fascia deep to the anterior abdominal wall.  The  patient may benefit from a repeat CT scan.  I will hold off until we obtain  her creatinine.  Dr. Johna Sheriff in the past was reluctant to drain any of this  abdominal fluid; however, with patient's persistent pain and symptoms, we  may need to readdress this issue.   We will have the patient on clears and cover her with NovoLog insulin for  diabetes and continue her outpatient depression medications.      Thomos Lemons, D.O. [facility]  Electronically Signed     RY/MEDQ  D:  06/02/2005  T:  06/02/2005  Job:  [id number]   cc:   Georgina Quint. Plotnikov, M.D. [facility]  [address]  [place]  [place] [id number]

## 2011-02-20 NOTE — Op Note (Signed)
[place]  Patient:    LAVANYA, ROA Visit Number: [id number] MRN: [id number]          Service Type: MED Location: [phone] Attending Physician:  Meredith Leeds Dictated by:   Zigmund Daniel, M.D. Proc. Date: 03/13/02 Admit Date:  03/13/2002                             Operative Report  PREOPERATIVE DIAGNOSIS:  Recurring abdominal wall seroma or abscess.  POSTOPERATIVE DIAGNOSIS:  Abdominal and intra-abdominal abscess due to old infected mesh and sutures and devitalized tissue.  OPERATION PERFORMED:  Drainage of abscess, debridement of abdominal wall and infected mesh.  SURGEON:  Zigmund Daniel, M.D.  ANESTHESIA:  General.  DESCRIPTION OF PROCEDURE:  After the patient was monitored and anesthetized and had routine preparation and draping of the abdomen, I opened a portion of the previous midline incision and got into the seroma cavity and removed the fluid.  Most of it was only slightly cloudy and then as I got deeper a little more cloudy fluid came out and some of it had a yellow tinge suggesting enterocutaneous fistula.  However, there was no bad odor to it. I came down on the old mesh and opened my incision just a little bit more so I could fully inspect it and found that it was mostly incorporated with granulation tissue on the lateral aspects but in the middle where the midline closure had been down, it was not incorporated down and I could see yellowish and unhealthy-appearing tissue underneath.  I opened the mesh in the midline there so as to inspect this area better and found that there was a good deal of devitalized fascia.  There was some loose Prolene suture of interrupted variety and running variety.  It did not seem to be having much effect and so I removed this suture.  I debrided unhealthy tissue.  As I worked further, I looked very hard for evidence of leakage of bilious material and none was apparent.   I found some previously placed mesh of what appeared to be polypropylene in the abdominal wall underneath the mesh that I had recently placed in my hernia repair and it seemed to be causing some problems in the midportion of the wound.  I pulled it up and removed several good-sized segments of this again looking for evidence that it might be against the bowel and causing the fistula but none was evident.  I did get into a cavity just beneath the fascia in the midpart of the wound which had some thin pus and I put my finger in that and it came out nicely again with no evidence of an enterocutaneous fistula.  After removing all the tissue which I felt was devitalized or infected, I very copiously irrigated the area and suctioned out the irrigant.  Bleeding was not a problem.  I used a lap to wipe away exudate and other material which might interfere with the recovery process.  I then closed the defect I had made in the fascia with a running #1 Prolene suture. That flattened it out some and appeared a lot better than it did before. I again irrigated and removed the irrigant.  I placed two suction drains.  The one on the left side was placed through the abdominal wall and then through a small hole in the mesh and more inferiorly so it lay  near the area where the small intra-abdominal abscess had occurred.  Then I put the other one in the deep subcutaneous plane just superficial to the mesh and brought out through the right upper quadrant.  I secured those to the skin with 2-0 Prolene.  I then dressed the wound after closing the subcutaneous tissues with running 2-0 Vicryl and closing the skin with staples.  The patient was stable through the procedure. Dictated by:   Zigmund Daniel, M.D. Attending Physician:  Meredith Leeds DD:  03/13/02 TD:  03/14/02 Job: [id number]

## 2011-02-20 NOTE — Op Note (Signed)
   NAME:  [name], [name]                            ACCOUNT NO.:  [id number]   MEDICAL RECORD NO.:  [id number]                   PATIENT TYPE:  OIB   LOCATION:  2899                                 FACILITY:  [facility]   PHYSICIAN:  Lorre Munroe., M.D.            DATE OF BIRTH:  [birth year]   DATE OF PROCEDURE:  12/06/2002  DATE OF DISCHARGE:                                 OPERATIVE REPORT   PREOPERATIVE DIAGNOSIS:  Chronic abdominal wound.   POSTOPERATIVE DIAGNOSIS:  Chronic abdominal wound.   PROCEDURE:  Excision and closure of chronic wound.   SURGEON:  Lebron Conners, M.D.   ANESTHESIA:  General.   DESCRIPTION OF PROCEDURE:  After the patient was monitored and anesthetized  and had routine preparation and draping of her abdomen, I excised the edges  of the chronic wound along with subcutaneous fat and scar tissue straight  down to the edges of the granulated wound cavity.  I got hemostasis with  cautery.  I excised small pieces of somewhat loose polypropylene mesh.  A  good part of the mesh was well-incorporated into the tissues.  I mobilized  the skin and subcutaneous tissues just above the muscle fascia laterally for  several centimeters on each side, and that allowed approximation of the  subcutaneous tissues and skin without tension.  After thoroughly irrigating  the wound and getting good hemostasis, I placed a 19 [place] round Blake  drain through a separate stab incision and laid it throughout the cavity.  I  then closed the subcutaneous tissues and scar with running 2-0 Vicryl and  closed the skin with staples.  The patient tolerated the operation well.                                               Lorre Munroe., M.D.    Jodi Marble  D:  12/06/2002  T:  12/06/2002  Job:  [id number]

## 2011-02-20 NOTE — Op Note (Signed)
NAME:  [name], [name]                  ACCOUNT NO.:  [id number]   MEDICAL RECORD NO.:  [id number]          PATIENT TYPE:  INP   LOCATION:  1608                         FACILITY:  [facility]   PHYSICIAN:  Lebron Conners, M.D.   DATE OF BIRTH:  1946-05-10   DATE OF PROCEDURE:  06/29/2006  DATE OF DISCHARGE:                                 OPERATIVE REPORT   PREOPERATIVE DIAGNOSIS:  Chronic draining lower abdominal wound with history  of methicillin-resistant staphylococcus aureus infection.   POSTOPERATIVE DIAGNOSIS:  Chronic draining lower abdominal wound with  history of methicillin-resistant staphylococcus aureus infection.   OPERATION:  Debridement and drainage and closure of abdominal wound.   SURGEON:  Dr. Orson Slick.   ANESTHESIA:  General.   COMPLICATIONS:  None.   BLOOD LOSS:  Minimal.   SPECIMEN:  None   PROCEDURE:  After the patient was monitored asleep utilizing LMA technique  and after routine preparation and draping of the lower abdomen, I excised a  good bit of the lower midline scar so as to be able to thoroughly open the  wound, inspect it, and debride it as needed.  I carried the dissection down  through the subcutaneous tissue to the chronically granulated pocket which  measured approximately 8 cm in diameter.  I found one small piece of  polypropylene mesh which I grasped deeply and debrided it with Mayo  scissors, and at that point, I could feel no more mesh at any point in the  wound.  There was by and large healthy granulation tissue present.  I  cauterized the granulations at the edges in a couple of areas where they  appeared to be hypertrophic.  I cauterized all subcutaneous bleeders.  In  order to be able to close the skin over a drain, I mobilized the skin and  subcutaneous flaps laterally for 2 to 3 cm on each side in the mid part of  the wound.  I irrigated the wound and removed the irrigant.  I brought in a  [place] 19-French round drain through a separate stab  incision in the left  side of the abdomen and cut it so it would lay comfortably in the depths of  the wound and applied the suction bulb.  After assuring good hemostasis and  after correct sponge, needle and instrument count, I stapled the skin.  The  skin came together very nicely with no tension.  Skin edges appeared  healthy.  Application of suction on the drain disclosed that it held nicely.  I dressed the wound with a bulky slightly compressive bandage.  She  tolerated the operation well.     Lebron Conners, M.D.  Electronically Signed    WB/MEDQ  D:  06/29/2006  T:  07/01/2006  Job:  [id number]

## 2011-02-20 NOTE — Op Note (Signed)
   NAME:  [name], [name]                            ACCOUNT NO.:  [id number]   MEDICAL RECORD NO.:  [id number]                   PATIENT TYPE:  INP   LOCATION:  0357                                 FACILITY:  [facility]   PHYSICIAN:  Lorre Munroe., M.D.            DATE OF BIRTH:  Dec 06, 1945   DATE OF PROCEDURE:  03/14/2003  DATE OF DISCHARGE:                                 OPERATIVE REPORT   PREOPERATIVE DIAGNOSES:  Chronic fluid collection/abscess abdominal wall.   POSTOPERATIVE DIAGNOSES:  Chronic fluid collection/abscess abdominal wall.   OPERATION:  Incision, evacuation and debridement and drainage of fluid  collection.   SURGEON:  Lebron Conners, M.D.   ANESTHESIA:  General.   DESCRIPTION OF PROCEDURE:  After the patient was monitored and anesthetized,  I removed the small drainage catheter which penetrated the known fluid  collection and anterior abdominal wall. I then made a midline incision  opening the old incision and dissected straight down and into the cavity. It  contained very little fluid at this time. It did have a lot of chronic  granulation tissue present in it. I opened it for the entire length of the  cavity and wiped out chronic exudate which was present and cauterized the  granulation tissue. I took a culture of the fluid which was present. It was  cloudy but not bilious and there was no evidence of an enterocutaneous  fistula. There was no suture material present nor any other foreign body  evident. I debrided the superficial aspect of the abscess cavity which was  very dense scar tissue. I cauterized the edges and the deep part of it to  remove any unhealthy granulations which were present. I irrigated it  thoroughly several times and made sure hemostasis was good. I placed a 10 mm  Blake suction drain through a lateral stab incision and secured it to the  skin with 2-0 silk. I cut it to the necessary length and left it in the  deepest part of the cavity. I  closed the skin with staples and established  suction and it seemed to be effective. The sponge, needle and instrument  counts were correct. The patient was stable through the procedure.                                               Lorre Munroe., M.D.    Jodi Marble  D:  03/14/2003  T:  03/14/2003  Job:  [id number]

## 2011-02-20 NOTE — Op Note (Signed)
[place]. [facility]  Patient:    [name], [name] Visit Number: [id number] MRN: [id number]          Service Type: DSU Location: [facility] Attending Physician:  Retia Passe Dictated by:   Vania Pitner. Warren Danes, D.D.S. Proc. Date: 06/22/01 Admit Date:  06/22/2001                             Operative Report  PREOPERATIVE DIAGNOSIS:  Maxillary mandibular nonrestorable teeth numbers 5, 8, 9, 10, 11, 23, 22, 24, 25, 26, 27.  POSTOPERATIVE DIAGNOSIS:  Maxillary mandibular nonrestorable teeth numbers 5, 8, 9, 10, 11, 23, 22, 24, 25, 26, 27.  PROCEDURE:  Removal of the above teeth.  SURGEON:  Vania Blasingame. Warren Danes, D.D.S.  FIRST ASSISTANT:  CHESNUTT  ANESTHESIA:  General via oral endotracheal intubation.  ESTIMATED BLOOD LOSS:  Less than 50 cc.  FLUID REPLACEMENT:  Approximately 1000 cc. crystalloid solutions.  COMPLICATIONS:  None apparent.  INDICATIONS FOR PROCEDURE:  [name] is a female who was referred to my office for removal of the above teeth.  The patient presents with a history of diabetes and hypertension and due to her medical history it was felt that the procedure would be most safely performed under general anesthesia in an operating room setting.  DETAILS OF PROCEDURE:  On June 22, 2001, [name] was taken to the [facility] where she was placed on the operating room table in a supine position.  Following successful oral endotracheal intubation and general anesthesia, the patients face, neck, and oral cavity were prepped and draped in the usual sterile operation room fashion.  Attention was then directed intraorally, where approximately 10 cc. of 0.50% xylocaine containing 1:200,000 epinephrine was infiltrated in the maxillary buccal and palatal soft tissues in the right and left inferior alveolar neurovascular regions.  A throat pack had been placed prior to this procedure.  Attention was then directed towards  the dentition where a full thickness mucoperiosteal flap was elevated laterally and medially around the dentition. The teeth were then subluxated from the alveolar using an 11A elevator and removed from the oral cavity using a 150 dental forcep on the maxillary arch and a 151 and a MD-3 forcep on the lower arch.  The bony margins were then smoother and contoured with a Stryker rotatory osteotome and a #8 round bur.  The surgical site was thoroughly irrigated with sterile saline irrigating solutions and suctioned.  The mucoperiosteal margins were then approximated and sutured using 4-0 chromic suture material on an FS-2 needle.  The throat pack was removed and the hypopharynx suctioned free of fluids and secretions.  [name] was allowed to awaken from the anesthesia and taken to the recovery room where she tolerated the procedure well and without apparent complication. Dictated by:   Vania Rinker. Warren Danes, D.D.S. Attending Physician:  Retia Passe DD:  06/22/01 TD:  06/22/01 Job: [id number]

## 2011-02-20 NOTE — Discharge Summary (Signed)
NAME:  [name], [name]                  ACCOUNT NO.:  [id number]   MEDICAL RECORD NO.:  [id number]          PATIENT TYPE:  INP   LOCATION:  5036                         FACILITY:  [facility]   PHYSICIAN:  Georgina Quint. Plotnikov, M.D. Romualdo Bolk OF BIRTH:  1946-03-03   DATE OF ADMISSION:  04/07/2006  DATE OF DISCHARGE:  04/10/2006                                 DISCHARGE SUMMARY   DISCHARGE DIAGNOSES:  Right lower extremity cellulitis.   ADDITIONAL DISCHARGE DIAGNOSES:  Type 2 diabetes; history of multiple wound  surgeries with recurrent infections; hypertension; history of depression.   MEDICATIONS ON DISCHARGE:  Cephalexin 500 mg 3 times per day for one week;  Vicodin 5/500 one every 6 hours p.r.n. pain; Lexapro 10 mg daily; Wellbutrin  XL 150 daily; Nexium 40 mg daily; Amaryl 2 mg daily; enalapril 10 mg daily;  Phenergan every 6 hours p.r.n. nausea; colchicine 0.6 mg twice daily p.r.n.  gout; Aldactone 50 mg twice per day.   FOLLOW-UP:  The patient will return to her primary care [name], Dr.  Posey [name], within the next week.   HISTORY OF PRESENT ILLNESS:  The patient is a 65 year old white female with  multiple medical problems.  She is status post multiple abdominal surgeries  for abdominal wound infections; and is status post drainage of an abdominal  wall abscess and infected mesh in June of this year.  She has been followed  daily by [facility] for daily wound care.   The patient presented to the emergency department complaining of an increase  in pain, swelling and excessive warmth involving the right lower extremity.  A D-dimer was twice the upper limits of normal.  The patient was  subsequently admitted for further evaluation and treatment of suspected  right lower extremity deep vein thrombosis.   HOSPITAL COURSE:  The patient was admitted to the hospital where venous  Doppler evaluation revealed no evidence of a DVT; and the patient was  treated with antibiotic therapy  for cellulitis.  The patient's hospital  course was marked by steady improvement involving the pain and swelling  involving the distal right lower extremity.  At the time of discharge, the  patient had minimal discomfort in the calf area, but the edema and erythema  had largely resolved.   LABORATORY INVESTIGATIONS:  Laboratory studies included a pancytopenia which  has been chronic, related to her splenomegaly.  White count was 2.5, H&H 9.3  and 27.6, platelet count 70,000.  Electrolytes were unremarkable, creatinine  0.7, potassium 4.4.   DISPOSITION:  The patient will be discharged today.  She will continue daily  follow-up with [facility] for her required wound care.  The patient  will be discharged on the antibiotic as mentioned, to complete an additional  seven days.   CONDITION ON DISCHARGE:  Improved.     ______________________________  [name], M.D. [facility]    ______________________________  [name], M.D. [facility]    PFK/MEDQ  D:  04/10/2006  T:  04/10/2006  Job:  [id number]

## 2011-02-20 NOTE — Discharge Summary (Signed)
NAME:  [name], [name]                  ACCOUNT NO.:  [id number]   MEDICAL RECORD NO.:  [id number]          PATIENT TYPE:  INP   LOCATION:  3009                         FACILITY:  [facility]   PHYSICIAN:  Rene Paci, M.D. LHCDATE OF BIRTH:  May 12, 1946   DATE OF ADMISSION:  12/06/2004  DATE OF DISCHARGE:  12/08/2004                                 DISCHARGE SUMMARY   DISCHARGE DIAGNOSIS:  Anemia.   BRIEF ADMISSION HISTORY:  Ms. Luddy is a 65 year old white female who was seen  in the office on the day prior to admission with complaints of weakness,  cough, and a possible UTI.  Labs were ordered and revealed a hemoglobin of  7.4.  The patient was sent to the emergency department for admission.   PAST MEDICAL HISTORY:  1.  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  2.  Enteral gastritis by endoscopy in May 2005.  3.  Normal colonoscopy in February 2005.  4.  Crohn's disease.  5.  Gastroesophageal reflux disease.  6.  Adult-onset diabetes mellitus.  7.  Chronic back pain.  8.. History of left knee pain.  1.  Inguinal hernia repair complicated by a seroma and MRSA requiring      removal of mesh.  2.  Right 6 to 7 mm distal ureteral stone with UTI in September 2005.  3.  Hypertension.  4.  Reactive depression.  5.  Obesity.  6.  Multifactorial anemia secondary to a low-grade GI bleed, status post      bone marrow biopsy which was normal, status post endoscopy and      colonoscopy which was essentially negative, status post small bowel      endoscopy, all done in February 2005.   HOSPITAL COURSE:  Problem:  Gastrointestinal.  The patient had an underlying  history of NASH.  The patient also has a history of anemia that has been  felt to be multifactorial in the past secondary probably to a low-grade GI  bleed.  Hematologic workup as well as GI workup has been unrevealing.  The  patient presented with a hemoglobin of 7.4.  She was transfused  four units  of packed red blood cells and is currently hemodynamically stable.  No  further workup was pursued at this time.  The patient will follow up with  her primary care physician.   LABORATORIES AT DISCHARGE:  Hemoglobin 10.8, platelet count 63.   MEDICATIONS AT DISCHARGE:  1.  Zantac 150 mg daily.  2.  Enalapril 20 mg b.i.d.  3.  Ceftin 500 mg b.i.d. as at home.  4.  Nexium 40 mg b.i.d.  5.  Celexa 40 mg 1-1/2 tablet daily.  6.  Glyburide 5 mg b.i.d.  7.  Oxycodone as at home.  8.  Glucophage has been discontinued.   FOLLOW-UP:  Follow up with Dr. Posey Mcneill in 1-2 weeks.      LC/MEDQ  D:  12/08/2004  T:  12/08/2004  Job:  [id number]   cc:   Georgina Quint. Plotnikov, M.D. [facility]

## 2011-02-20 NOTE — Consult Note (Signed)
NAME:  [name], [name]                  ACCOUNT NO.:  [id number]   MEDICAL RECORD NO.:  [id number]          PATIENT TYPE:  INP   LOCATION:  2030                         FACILITY:  [facility]   PHYSICIAN:  Sharlet Salina T. Hoxworth, M.D.DATE OF BIRTH:  [birth year]   DATE OF CONSULTATION:  05/08/2005  DATE OF DISCHARGE:                                   CONSULTATION   CHIEF COMPLAINT:  Abdominal pain.   HISTORY OF PRESENT ILLNESS:  I have been asked by [facility] and  GI to evaluate Ms. [name]. She is a complicated 65 year old female admitted on  May 05, 2005, with worsening abdominal pain, weakness, falling, and  anemia. Ms. [name] has a significant history of remote Crohn's disease  requiring resection approximately 20 years ago. More recent surgery includes  an incisional hernia repair with mesh by Dr. Orson Slick in 2003. This mesh  subsequently became infected and required removal. She then developed a  large alcohol wall fluid collection and underwent incision, debridement, and  drainage of her abdominal wall fluid collection in June 2004.  The patient  has a long history of chronic abdominal pain. This apparently has been  worsening over the last several months. She describes increasing pain mainly  in her epigastrium radiating to both upper quadrants, but also somewhat in  her lower abdomen as well. She has nausea, occasional vomiting, but has been  worsening. It is worse with fatty or greasy foods. She has had diarrhea.  Prior to admission she began developing profound weakness and falling, and  was admitted to the emergency room. She was found to have profound anemia  and dehydration as well. She has continued to have pain in the hospital.   She has had a workup to date including an EGD which has shown an ulcer in  the second portion of the duodenum.  She had an EGD previously in May of  this year showing only mild antral erosions. She had a normal colonoscopy to  the ileum performed in  February of this year.   PAST MEDICAL HISTORY:  1.  Crohn's disease as above.  2.  Cirrhosis of the liver, non-alcoholic.  3.  Type 2 diabetes.  4.  Depression.  5.  Chronic abdominal pain.  6.  Status post cholecystectomy and ERCP.   MEDICATIONS ON ADMISSION:  Phenergan, Metformin, aldactone, Nexium,  Wellbutrin, Septra, and morphine.   ALLERGIES:  None noted.   SOCIAL HISTORY:  Lives with her husband. Fairly debilitated. No cigarette or  alcohol use.   FAMILY HISTORY:  Noncontributory.   REVIEW OF SYSTEMS:  GENERAL: Positive for progressive weakness, 20-pound  weight loss. She states she has had some intermittent fever.  RESPIRATORY:  Denies shortness of breath, cough, or wheezing. CARDIAC: Denies chest pain,  palpitations. ABDOMEN/GI: As above.   PHYSICAL EXAMINATION:  VITAL SIGNS: Temperature 97.7, heart rate 100,  respirations 20, blood pressure 126/75, O2 saturations 98%.  GENERAL: A chronically ill, mildly obese, female in no acute distress.  SKIN: Warm and dry.  HEENT: No masses or thyromegaly.  LUNGS: Clear to auscultation without  wheezing or increased work of  breathing.  CARDIAC: Regular rate and rhythm without murmurs.  ABDOMEN: There is a healed lower midline incision. Bowel sounds are  hypoactive. There is diffuse abdominal tenderness, but this is greatest in  the epigastrium, left upper quadrant, and right upper quadrant. There is a  Coker incision in the right upper quadrant. There is a healed low midline  incision. There may be some mild fullness of the low midline incision, but  this is subtle. I cannot detect a definite hernia. There is also lower  abdominal tenderness and tenderness over the incision, but less so than the  upper abdomen.   LABORATORY AND X-[name] EVALUATION:  White count 2.7, hemoglobin 8, platelet  count 78. BUN is 35 down from over 100 on admission, glucose 145.  Electrolytes normal. Alpha fetoprotein normal at 3.8. Total protein 5.7,   albumin 2.4, and the remainder of LFTs normal.   I have reviewed her CT scan which shows a couple of fluid collections in the  abdominal wall with the largest measuring just over 7 cm. CT scan one year  ago showed similar, though slightly smaller fluid collections, maximum  diameter of 5 cm.   ASSESSMENT AND PLAN:  A 65 year old female with complicated with abdominal  and gastrointestinal history and longstanding chronic abdominal pain. She  has worsening abdominal pain, but does have new finding of duodenal ulcer.  Her abdominal wall fluid collection is chronic and not really in the  location of the majority of her pain. I doubt that this responsible for a  significant amount of her symptoms.  I do not believe that repeat drainage  of this fluid collection has significantly improved her situation. I would  not recommend surgery at this time particularly with her comorbidities. I  will discuss this with Dr. Orson Slick who has done her previous surgery.       BTH/MEDQ  D:  05/08/2005  T:  05/09/2005  Job:  [id number]

## 2011-02-20 NOTE — Op Note (Signed)
[place]  Patient:    [name], [name] Visit Number: [id number] MRN: [id number]          Service Type: SUR Location: 5700 [id number] Attending Physician:  Meredith Leeds Dictated by:   Zigmund Daniel, M.D. Proc. Date: 01/04/02 Admit Date:  01/04/2002 Discharge Date: 01/10/2002                             Operative Report  PREOPERATIVE DIAGNOSIS:  Incisional hernia.  POSTOPERATIVE DIAGNOSIS:  Incisional hernia.  OPERATION PERFORMED:  Repair of incisional hernia.  SURGEON:  Zigmund Daniel, M.D.  ASSISTANT:  Chevis Pretty, M.D.  ANESTHESIA:  General.  DESCRIPTION OF PROCEDURE:  After the patient was monitored and anesthetized, and had a Foley catheter and routine preparation draping of the abdomen, I made a short transverse incision on the left flank area, dissected down to the flat muscles and split them in the direction of their fibers and then opened the peritoneum.  I put in a Hasson cannula and inflated the abdomen with CO2 after securing it with a pursestring suture in the fascia. I could see a lot of midline adhesions.  I put in two 5 mm ports and attempted take down of the adhesions.  There was great difficulty because of dense omental adhesion and the extent of the hernia which was multiple swiss cheese type arrangement.  I worked for about an hour and decided to abandon the laparoscopic approach because of hazard of bleeding and had noted that the patient had cirrhosis of her liver.  I then closed the lateral incision by tying the pursestring suture.  Then I incised the abdomen in the midline, excising the scar.  I then carefully dissected out through the subcutaneous tissues avoiding cutting into the hernia contents and went down to the midline fascia for the full length of the midline incision.  Multiple hernia defects were noted.  I then raised skin and subcutaneous flaps out laterally until the fascia appeared to be  quite healthy on each side toward the lateral edge of the rectus muscles.  The area of the hernia in the midpart and upper part of the incision, I connected the holes and debrided the unhealthy fascia and reduced the hernia.  Then I dissected adhesions back away from the anterior abdominal wall on the inside. I got hemostasis with sutures and with cautery.  I then closed the midline remaining defect with running #1 Prolene suture.  I then fashioned a large patch of polypropylene mesh to fit into the dissected defect and I sewed it in with a combination of interrupted and running basting 0 Prolene suture to hold it down flat.  I placed two large suction drains through separate stab incisions and secured them to the skin with 0 Prolene stitches.  I then closed the skin with staples after irrigating the subcutaneous tissues.  I connected the drains to suction bulbs.  I applied bandages.  Sponge, needle and instrument counts were correct.  The patient was stable through the procedure. Dictated by:   Zigmund Daniel, M.D. Attending Physician:  Meredith Leeds DD:  01/04/02 TD:  01/05/02 Job: [id number]

## 2011-02-20 NOTE — Discharge Summary (Signed)
NAME:  [name], [name]                  ACCOUNT NO.:  [id number]   MEDICAL RECORD NO.:  [id number]          PATIENT TYPE:  INP   LOCATION:  5526                         FACILITY:  [facility]   PHYSICIAN:  [name], M.D. LHCDATE OF BIRTH:  November 02, 1945   DATE OF ADMISSION:  06/02/2005  DATE OF DISCHARGE:  06/05/2005                                 DISCHARGE SUMMARY   DISCHARGE DIAGNOSES:  1.  Chronic abdominal pain.  2.  Nonalcoholic steatohepatitis cirrhosis.  3.  Pancytopenia.  4.  History of Crohn's disease.   HISTORY OF PRESENT ILLNESS:  The patient is a 65 year old white female with  history of Crohn's disease and chronic abdominal pain, as well as history of  infected mesh secondary to hernia repair, who presented with complaints of  persistent, severe abdominal pain which had worsened over the last week  prior to admission.  The patient was admitted for further evaluation.   PAST MEDICAL HISTORY:  1.  NASH cirrhosis.  2.  Duodenal ulcers per endoscopy performed May 07, 2005.  3.  History of pancytopenia.  4.  History of diabetes, type 2.  5.  History of Crohn's disease.  6.  History of hernia repair with infected mesh with Dr. Orson [name] in the past.  7.  Depression.  8.  History of hepatic encephalopathy.   HOSPITAL COURSE:  1.  Chronic abdominal pain.  The patient was admitted and was placed on a      fentanyl patch.  A KUB was performed on admission which showed a mild      small bowel ileus or partial obstruction.  As result, a CT of the      abdomen and pelvis was performed which noted question of early low-grade      small bowel obstruction and anterior abdominal wall fluid collections      and inflammatory stranding in the anterior aspect of the abdomen which      is unchanged, as compared to prior films.  A GI evaluation was requested      and the GI team did not suggest any further workup at this time.  Dr.      Arlyce [name] doubted that the patient truly had a small  bowel obstruction or      that this was contributing to the patient's chronic abdominal pain.  He      recommended the possibility of the patient being seen in the pain clinic      for further pain management.  The patient was placed on clears and      eventually advanced to regular diet which she tolerated.  A followup KUB      performed on June 05, 2005 showed resolving small bowel diltation.  2.  Depression.  The patient clearly has history of depression.  She is      maintained on Wellbutrin 150 mg p.o. daily.  It is very likely that the      patient's depression is contributing to issues of chronic pain.      Criteria for readmission would be to include new findings  on CT abdomen      or inability to tolerate p.o.  3.  A surgical consult was requested during this hospitalization secondary      to the patient's questionable small bowel obstruction.  The physician      assistant with [facility] visited the patient to initiate consult      and the patient stated that she did not wish to be seen by surgery.  As      a result the surgical team signed off.   MEDICATIONS AT DISCHARGE:  1.  Wellbutrin 150 mg p.o. once daily.  2.  Fentanyl patch 25 mcg/h to be changed every 72 hours, prescription given      for 9 patches.  3.  Nexium 40 mg p.o. daily.  4.  Aldactone 50 mg p.o. b.i.d.  5.  Multivitamin once daily.  6.  Lactulose 30 ml p.o. daily.  7.  Phenergan 25 mg p.o. q.6 h. p.r.n.  8.  Iron tablet once daily.  9.  Amaryl 1 mg p.o. daily.  10. Morphine.  The patient to resume preadmission dosing.   LABORATORIES:  At discharge:  Hemoglobin 9.7, hematocrit 28.5, platelet  count 68, white blood cell count 2.4.  Sodium 135, potassium 3.9, chloride  107, CO2 22, BUN 9, creatinine 1.0, glucose 107.   FOLLOW UP:  A followup appointment has been scheduled for the patient to see  [name] to consider referral to pain clinic as an outpatient.      [name] S.  Peggyann Juba, NP      [name], M.D. [facility]  Electronically Signed    MSO/MEDQ  D:  06/08/2005  T:  06/08/2005  Job:  [id number]   cc:   [name], M.D. [facility]  [address]  [place]  [place] [address]

## 2011-02-20 NOTE — Discharge Summary (Signed)
NAME:  [name], [name]                  ACCOUNT NO.:  [id number]   MEDICAL RECORD NO.:  [id number]            PATIENT TYPE:   LOCATION:                                 FACILITY:   PHYSICIAN:  Henry A. Pool, M.D.         DATE OF BIRTH:   DATE OF ADMISSION:  05/03/2006  DATE OF DISCHARGE:  05/06/2006                                 DISCHARGE SUMMARY   FINAL DIAGNOSIS:  Status post fall with traumatic subarachnoid hemorrhage.   HISTORY OF PRESENT ILLNESS:  [name] suffered a spontaneous fall with  resultant headache. Workup demonstrates evidence of small amount of  traumatic subarachnoid blood. The patient is admitted for observation.   HOSPITAL COURSE:  The patient admitted to the hospital where she was  observed with neurologic evaluations. She had no change in her overall  neurologic function. Syncopal workup was negative. Follow-up head CT scans  were stable. Patient was able to be discharged home in good condition. She  will follow up with me as needed.           ______________________________  Kathaleen Maser Pool, M.D.     HAP/MEDQ  D:  08/19/2006  T:  08/19/2006  Job:  [id number]

## 2011-02-20 NOTE — Consult Note (Signed)
NAME:  [name], [name]                  ACCOUNT NO.:  [id number]   MEDICAL RECORD NO.:  [id number]          PATIENT TYPE:  INP   LOCATION:  2621                         FACILITY:  [facility]   PHYSICIAN:  Sean A. Everardo All, M.D. [facility] OF BIRTH:  04-03-1946   DATE OF CONSULTATION:  DATE OF DISCHARGE:                                   CONSULTATION   REASON FOR CONSULTATION:  Medical care in a complex woman who is being  admitted by neurosurgery today.   HISTORY OF PRESENT ILLNESS:  A 65 year old woman with a complex medical  history who was at home and had several minutes of dizziness.  After that,  she is not certain how or why she fell but she struck the back of her head.  She had 2 hours of slight pain at the back of her head but no associated  visual loss.   PAST MEDICAL HISTORY:  1.  Recently admitted with severe anemia.  2.  Chronic pain syndrome with low back pain.  3.  Cirrhosis with chronic hepatic failure.  4.  Chronic failure to thrive.  5.  GERD.  6.  Type 2 diabetes.  7.  Depression.   MEDICATIONS:  As per recent hospital discharge;  1.  Amaryl 1 mg twice daily.  2.  Nexium 40 mg daily.  3.  Wellbutrin XL 150 mg daily  4.  Vasotec 10 mg daily.  5.  Aldactone 100 mg daily.  6.  Phenergan 12.5 mg as needed for nausea.  7.  Lactulose 30 cc daily.  8.  Morphine 15 mg 4 times a day as needed for pain.  9.  MS Contin 30 mg daily.   SOCIAL HISTORY:  She is married.  She is retired and lives with her husband.   FAMILY HISTORY:  Her husband is in poor health.   REVIEW OF SYSTEMS:  She has lost 70 pounds in the past 4 years.  She does  have slight dysuria.   PHYSICAL EXAMINATION:  Vital signs: Blood pressure is 116/77, heart rate  108, respiratory rate 10, temperature is 98 degrees.  General: Chronically ill woman with a bandage on her head and some blood on  her pillow.  HEENT: The back of her head is not examined, this is being done by  neurosurgery.  Her face does not  have any trauma.  Neck: Supple.  Chest: Clear to auscultation.  Cardiovascular: No JVD, there is 1+ bilateral pretibial edema.  Regular rate  and rhythm. No murmur.  Pedal pulses are intact.  Abdomen: Soft, there is slight minimal tenderness.  She has her chronic  abdominal wall and she has drainage device in place.  Breasts/Gynecological: Not done at this time due to her condition.  Neurologic: She is alert and she is cooperative.  She is oriented except she  states May 02, 2006, one day off from today's actual date.  Cranial nerves  appear to be intact.  She moves all extremities.  Sensation is intact to  touch.   LABORATORY DATA:  Remarkable for hemoglobin 12.1.  Platelets at 71,000,  WBC  4,700, sodium 132, glucose 147.  Ammonia 70.   IMPRESSION:  1.  Fall today with a traumatic subarachnoid hemorrhage.  2.  Chronic pain syndrome.  In view of #3, this presents a difficult      therapeutic dilemma, a blunt head trauma one would like to optimize her      pain relief.  On the other, her chronic hepatic insufficiency limits      clearance of narcotics, and she is at risk for falls and bleeding with      her low platelet count.  3.  Chronic hepatitic insufficiency with cirrhosis and portal hypertension.  4.  Type 2 diabetes.  5.  Dysuria.  6.  Other chronic medical problems as noted above.   PLAN:  1.  We will continue to follow the patient medically.  2.  Will hold Amaryl for now, and given p.r.n. insulin.  3.  Check urinalysis.  4.  Will have to decrease her narcotics for now, but will balance the risks      and benefits of narcotics as best we can noting the therapeutic dilemma      as above.  5.  Will continue her other outpatient medications.           ______________________________  Cleophas Dunker Everardo All, M.D. [facility]     SAE/MEDQ  D:  05/03/2006  T:  05/04/2006  Job:  [id number]

## 2011-02-20 NOTE — H&P (Signed)
NAME:  [name], [name]                  ACCOUNT NO.:  [id number]   MEDICAL RECORD NO.:  [id number]          PATIENT TYPE:  INP   LOCATION:  5008                         FACILITY:  [facility]   PHYSICIAN:  Georgina Quint. Plotnikov, MDDATE OF BIRTH:  07-31-1946   DATE OF ADMISSION:  06/04/2006  DATE OF DISCHARGE:                                HISTORY & PHYSICAL   CHIEF COMPLAINT:  Nausea, vomiting, fever, weakness.   HISTORY OF PRESENT ILLNESS:  The patient is a 65 year old female with  multiple medical problems who presents to the office today with complaint of  feeling very weak over several days duration with nausea, vomiting, unable  to keep much down, weakness, aches all over.  Denies blood in the stool.  No  cough, no urinary symptoms.   PAST MEDICAL HISTORY:  1. Liver cirrhosis.  2. Type 2 diabetes.  3. Hypertension.  4. Crohn's disease.  5. Chronic depression.  6. 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  7. Nephrolithiasis.  8. Chronic UTI.  9. History of bilateral subarachnoid hemorrhage in August.   CURRENT MEDICATIONS:  Reviewed with the patient.   SOCIAL HISTORY:  She is married.  Does not smoke now - she is an ex-smoker.  No alcohol.  She does not work.   FAMILY HISTORY:  Positive for coronary disease.   REVIEW OF SYSTEMS:  Denies headache.  No double vision.  No cough.  The  urine looks okay.  Denies syncope.  The rest is as above or negative.   PHYSICAL EXAMINATION:  VITAL SIGNS:  Blood pressure 117/71, pulse 102,  temperature 101.2, weight 190 pounds.  GENERAL:  She is in no acute distress, appears tired and chronically ill.  HEENT:  Dryish oral mucosa.  NECK:  Supple, no meningeal signs.  LUNGS:  Clear, no wheezes.  HEART:  S1, S2.  Slight tachycardia.  ABDOMEN:  Soft, nondistended, nontender, no organomegaly or mass felt.  Midline wound dressed.  LOWER EXTREMITIES:  Without edema.  Calves nontender.  Skin with some  bruises.  NEUROLOGIC:  She is deaf.  Alert, cooperative.   No asterixis.  Neurologic  exam nonfocal.   ASSESSMENT AND PLAN:  1. Fever, likely due to problem #2.  Will obtain blood cultures for      temperatures greater than 102.  2. Probable urinary tract infection, recurrent.  Will treat with IV      antibiotics.  3. Nausea and vomiting.  Will use IV Phenergan, clear liquids.  4. Dehydration.  Will use IV fluids.  5. Abdominal wall wound, healing, still has an open spot.  Will notify Dr.      Orson Slick of her admission.  Continue with dressing changes.  6. History of recent subarachnoid hemorrhage in the beginning of August,      currently asymptomatic.  Will not use Lovenox for prophylaxis.  7. Chronic anemia with arteriovenous malformations.  Obtain CBC.  Will      transfuse if needed.  She was on iron at home.  8. Type 2 diabetes.  Will continue with sliding scale.  9. Chronic pain.  Will continue with morphine.  10.Liver cirrhosis.  Obtain ammonia level.           ______________________________  Georgina Quint. Plotnikov, MD     AVP/MEDQ  D:  06/04/2006  T:  06/04/2006  Job:  [id number]   cc:   Hedwig Morton. Juanda Chance, MD

## 2011-02-20 NOTE — Discharge Summary (Signed)
NAME:  [name], [name]                            ACCOUNT NO.:  [id number]   MEDICAL RECORD NO.:  [id number]                   PATIENT TYPE:  INP   LOCATION:  0440                                 [facility]ACILITY:  [facility]   PHYSICIAN:  Rene Paci, M.D. [facility]          DATE OF BIRTH:  03-27-1946   DATE OF ADMISSION:  11/19/2003  DATE OF DISCHARGE:  11/28/2003                                 DISCHARGE SUMMARY   DISCHARGE DIAGNOSES:  1. Pancytopenia multifactorial.  2. Leukocytopenia, thrombocytopenia secondary to cirrhosis and low-grade     consumption.  3. Anemia due to low-grade gastrointestinal bleed status post bone marrow     biopsy normal status post esophagogastroduodenoscopy colon negative,     small bowel capsule study endoscopy question duodenitis not seen on     esophagogastroduodenoscopy for outpatient gastrointestinal review,     overall count stable.  4. Abdominal pain/back pain, chronic.  5. Seroma status post multiple surgeries, no evidence of infection.  6. Longstanding nonalcoholic steatohepatitis cirrhosis.  7. Type 2 diabetes.  8. History of Crohn's.  9. History of depression.  10.      History of multiple abdominal surgeries with infectious     complications, remote and stable.   DISCHARGE MEDICATIONS:  Are as prior to admission without change.   CONDITION ON DISCHARGE:  Medically stable.   BRIEF HOSPITAL COURSE:  1. PANCYTOPENIA:  The patient is an unfortunate 65 year old woman with type     2 diabetes who was at the primary care physician's office for general     review when lab work done showed a significant pancytopenia.  Because of     unclear details as to the duration of these values, the patient was     admitted for further evaluation. GI saw her in consultation and confirmed     the patient had a longstanding history of nonalcoholic steatohepatitis     with cirrhosis and as such had associated thrombocytopenia and     leukocytopenia.  To confirm that there  was no underlying bone marrow     issue, hematology was consulted who performed a bone marrow biopsy and     reports this was within normal limits.  The patient's anemia was likely     due to a low-grade chronic GI irritation and her Protonix was increased     to twice daily.  She was transfused during this hospitalization, but as     no evidence of active was identified there was little else to be done     other than careful surveillance and outpatient follow up.  EGD and colon     failed to show any evidence of bleeding although there was question of     duodenitis on her small capsule endoscopy.  This can be followed up on an     outpatient basis by her GI physician, Dr. Lina Sar.  2. OTHER MEDICAL ISSUES:  The patient's other medical issues including her     chronic low back pain, seroma post surgical changes, diabetes, were     treated as with her home medications.  No other changes were made during     this hospitalization.                                               Rene Paci, M.D. [facility]    VL/MEDQ  D:  12/24/2003  T:  12/26/2003  Job:  [id number]

## 2011-02-20 NOTE — H&P (Signed)
NAME:  [name], [name]                  ACCOUNT NO.:  [id number]   MEDICAL RECORD NO.:  [id number]          PATIENT TYPE:  INP   LOCATION:  5707                         FACILITY:  [facility]   PHYSICIAN:  Georgina Quint. Plotnikov, M.D. Romualdo Bolk OF BIRTH:  [birth year]   DATE OF ADMISSION:  05/26/2005  DATE OF DISCHARGE:                                HISTORY & PHYSICAL   CHIEF COMPLAINT:  Leg swelling, pain in legs and back, unable to walk,  feeling bad, crying.   HISTORY OF PRESENT ILLNESS:  The patient is a 65 year old female who walked  in the office today with the above complaints.  She is tearful, states  unable to take care of herself at home.  States her husband is exhausted  taking care of her, that her leg swelling bothers her tremendously, making  her unable to walk well.  States she did not have bowel movement in spite of  taking 30 mL a day of lactulose, decreased urinary output.   PAST MEDICAL HISTORY:  Reviewed.   She is on:  1.  Spironolactone 100 mg daily.  2.  Lactulose 38 mL daily.  3.  Iron over-the-counter daily.  4.  Glimepiride 1 mg daily.  5.  Morphine 15 mg t.i.d. p.r.n.  6.  Nexium 40 mg daily.  7.  Wellbutrin XL 150 mg q.a.m.  8.  Phenergan 25 mg four times a day.  9.  Enalapril 20 mg 1/2 daily.   ALLERGIES:  V-PAC and TETRACYCLINE.   PAST MEDICAL HISTORY:  1.  Liver cirrhosis.  2.  Anemia.  3.  Esophageal varices.  4.  GERD.  5.  Chronic pain in the neck.  6.  Chronic leg swelling.  7.  Depression.  8.  Agitated behavior during previous hospitalization.   SOCIAL HISTORY:  She is married, lives with husband.  Does work.  She quit  smoking a long time ago, no alcohol.   FAMILY HISTORY:  Both parents deceased.   REVIEW OF SYSTEMS:  Denies chest pain or shortness of breath.  Denies being  suicidal.  The rest as above or negative.  No blood in the stool.   PHYSICAL EXAMINATION:  VITAL SIGNS: Blood pressure 100/66, pulse 109,  temperature 98.1.  Weight  196.  GENERAL:  Appears chronically ill in no acute distress, tearful.  HEENT:  Moist mucosa.  NECK:  Supple.  No meningeal signs.  Tentative range of motion.  LUNGS: Clear.  No wheeze or rales.  HEART:  S1, S2.  No murmur, no gallops.  ABDOMEN:  Obese, soft, nontender.  No organomegaly, no mass felt.  EXTREMITIES:  Lower extremities have 2+ edema bilaterally, tender to  palpation.  No redness.  Calves nontender.  SKIN:  Without bruises.  NEUROLOGIC: No asterixis.  She is alert, oriented, cooperative, depressed,  tearful, not suicidal.   ASSESSMENT AND PLAN:  1.  Hepatic insufficiency with elevated ammonia level.  She is likely to      suffer from some hepatic encephalopathy.  Asterixis is absent.  Will      increase lactulose for goal  1 to 2 bowel movements daily.  2.  Type 2 diabetes.  Continue current therapy and use sliding scale.  3.  Neck pain, low back pain, chronic.  Will use oxycodone with caution 5 mg      four times a day p.r.n.  4.  Edema of lower extremities.  Will use IV Lasix.  Increase spironolactone      to 200 mg daily.  5.  Failure to thrive.  Consultation with case manager regarding future      disposition.  6.  Depression without suicidal thoughts.           ______________________________  Georgina Quint. Plotnikov, M.D. [facility]     AVP/MEDQ  D:  05/26/2005  T:  05/26/2005  Job:  [id number]

## 2011-02-20 NOTE — Op Note (Signed)
NAME:  Yniguez, LUCEAL HOLLIBAUGH                            ACCOUNT NO.:  [id number]   MEDICAL RECORD NO.:  [id number]                   PATIENT TYPE:  AMB   LOCATION:  DAY                                  FACILITY:  [facility]   PHYSICIAN:  Valetta Fuller, M.D.               DATE OF BIRTH:  04-07-1946   DATE OF PROCEDURE:  05/02/2004  DATE OF DISCHARGE:                                 OPERATIVE REPORT   PREOPERATIVE DIAGNOSES:  Right ureteral calculus.   POSTOPERATIVE DIAGNOSES:  Right ureteral calculus.   PROCEDURE:  1. Cystoscopy.  2. Right ureteroscopy.  3. Laser lithotripsy.  4. Basket stone extraction of right ureteral fragment.  5. Placement of a 6 x 24 double J right ureteral stent.   SURGEON:  Valetta Fuller, M.D.   RESIDENT SURGEON:  Thyra Breed, M.D.   ANESTHESIA:  General endotracheal.   DRAINS:  6 x 24 double J right ureteral stent.   SPECIMENS:  Right ureteral stone fragment.   COMPLICATIONS:  None.   INDICATIONS FOR PROCEDURE:  Ms. Cordone is a 65 year old female admitted to  [facility] on July 26 with severe abdominal pain, nausea, vomiting  and inability to tolerate p.o.'s.  Ms. Alvidrez suffers from multiple medical  problems and while undergoing a workup, CT scan revealed a 5 mm distal right  ureteral calculus with significant hydroureteronephrosis proximal to the  stone.  The patient was offered both expectant management as there is a 50%  chance she might pass the stone with aggressive IV hydration. However, given  the patient's amount of pain as well as her hospitalization from this  episode of what appears to be stone colic, we elected to proceed with  definitive surgical management in the form or ureteroscopy, laser  lithotripsy and basket stone extraction.  All the risks, benefits, and  alternatives of the procedure were described in detail and the patient was  willing to proceed. Informed consent was obtained.   DESCRIPTION OF PROCEDURE:  Following  identification by her arm bracelet, the  patient was brought to the operating room and placed in the supine position.  Here she underwent successful induction of general endotracheal anesthesia  and received preoperative IV antibiotics. She was then moved to the dorsal  lithotomy position, her perineum and genitalia were then prepped with  Betadine and draped in the usual sterile fashion.  Initial inspection of the  perineum demonstrated a significant grade 2 to 3 rectocele.  Upon entry into  the urethra with a 12 degree cystoscopic lens through a 22 1/2 [place]  sheath, there were no urethral abnormalities noted.  Once in the bladder,  there was 1 plus trabeculation, both the right and left ureteral orifice  were in their normal anatomic location effluxing clear urine bilaterally.  Further inspection of the bladder with both the 12 and 70 degree cystoscopic  lens revealed no evidence of  mucosal irregularity, foreign bodies, tumors or  stones.  Using a cone tip 6 French ureteral catheter, we then performed  right retrograde ureteropyelography by injection of contrast into the right  collecting system.  This demonstrated an area of filling defect in the  distal right ureter consistent with another stone with significant  hydroureteronephrosis proximal to the stone.  We then placed a 0.038  guidewire through the working port of the cystoscope and advanced it into  the right ureter up the right renal collecting system under direct  fluoroscopic guidance. Once good curl was confirmed, the cystoscope was  removed. Using the semirigid ureteroscope with saline as an irrigant, we  then passed the ureteroscope into the right ureteral orifice and into the  distal 1/3 of the right ureter to find what appeared to be an approximate 7  mm ureteral calculus in length.  There was minimal bolus edema in this area  of the ureter. We then elected to fragment the stone as it was unlikely to  be removed easily  with basket stone extraction. Using a 365 fiber with  settings of 5 and 6, a frequency of 5 and 0.5 joules. We then performed  laser lithotripsy of the right ureteral calculus.  The stone was fragmenting  somewhat slowly so we increased the energy to 0.6 which provided adequate  fragmentation of the ureteral calculus.  Once fragmented into small enough  fragments for removal, the laser was removed. It should be noted that all  operating room personnel including the patient wore safety glasses during  the laser portion of the procedure. Using a nitinol stone basket, we then  removed all remaining fragments from the right ureter.  One final inspection  with the ureteroscope to the level of the right ureteropelvic junction  showed no remaining fragments within the ureter.  However, the ureteral  orifice was somewhat narrow and there was some trauma from the previous  stone in the ureter, therefore, we elected to leave a stent.  The wire was  then back loaded over the cystoscope. A 6 x 24 double J ureteral stent was  then passed over the wire and advanced up the right renal collecting system  under direct fluoroscopic guidance. Once good curl was confirmed within the  right renal pelvis, the wire was removed illustrating an excellent curl in  the right renal pelvis as well as excellent curl in the bladder via direct  cystoscopic visualization.  The bladder was then drained and the stone  fragments were collected for metabolic analysis.  The patient tolerated the  procedure well and there were no complications. Please note that Dr. Isabel Caprice  was present and participated in the entire procedure as he was the  responsible surgeon.   DISPOSITION:  After awaking from general anesthesia, the patient was  transported to the post anesthesia care unit in stable condition.  From  here, she will be transferred back to [facility] for further postoperative management. Her followup will be with Dr. Isabel Caprice for  stent removal in the  future.  He has written prescriptions for Cipro 250 mg q.d. as well as  Urised for burning and spasm.     Thyra Breed, MD                            Valetta Fuller, M.D.    EG/MEDQ  D:  05/02/2004  T:  05/02/2004  Job:  [id number]

## 2011-02-20 NOTE — Op Note (Signed)
   NAME:  [name], [name]                            ACCOUNT NO.:  [id number]   MEDICAL RECORD NO.:  [id number]                   PATIENT TYPE:  INP   LOCATION:  5706                                 FACILITY:  [facility]   PHYSICIAN:  Skeet Simmer., M.D.         DATE OF BIRTH:  [birth year]   DATE OF PROCEDURE:  05/23/2002  DATE OF DISCHARGE:                                 OPERATIVE REPORT   PREOPERATIVE DIAGNOSIS:  Abdominal wall abscess.   POSTOPERATIVE DIAGNOSIS:  Abdominal wall abscess.   PROCEDURE:  Drainage of abdominal wall abscess.   SURGEON:  Zigmund Daniel, M.D.   ANESTHESIA:  General.   DESCRIPTION OF PROCEDURE:  After the patient was monitored and anesthetized  and had routine preparation and draping of the abdomen, I reopened the  midline incision for a short distance in the point of maximum induration.  I  dissected all the way down into the abscess cavity, which contained a lot of  chronic blood clot and infected-looking tissue.  I took new cultures and  evacuated the fluid and coagulum.  Bleeding was not a problem and was easily  obtained with cautery in the subcutaneous tissues.  I removed the previously-  placed percutaneous drain.  The hernia repair mesh was seen to be intact on  the anterior abdominal wall and did not appear to be loosened at the edges  at all.  Granulation tissue was coming through it in a number of locations,  and it appeared that should be left intact.  I copiously irrigated the  cavity and removed the irrigant and further debris.  After doing so, I  packed the wound with moist Kerlix and applied a bulky bandage.  The sponge,  needle, and instrument counts were correct.  The patient was stable through  the procedure.                                               Skeet Simmer., M.D.    Elvis Coil  D:  05/23/2002  T:  05/25/2002  Job:  [id number]

## 2011-02-20 NOTE — H&P (Signed)
NAME:  Danzer, MAYLYN NARVAIZ                            ACCOUNT NO.:  [id number]   MEDICAL RECORD NO.:  [id number]                   PATIENT TYPE:  INP   LOCATION:  5704                                 FACILITY:  [facility]   PHYSICIAN:  Georgina Quint. Plotnikov, M.D. [facility]      DATE OF BIRTH:  [birth year]   DATE OF ADMISSION:  04/29/2004  DATE OF DISCHARGE:                                HISTORY & PHYSICAL   CHIEF COMPLAINT:  Fever, abdominal pain.   HISTORY OF PRESENT ILLNESS:  The patient is a 65 year old female with  multiple medical problems who was seen today in the office where she  presented with her sister-in-law with complaint of ongoing fever in spite of  taking oral antibiotics for quite a few days and ongoing abdominal pain.  She noticed she continued to have dark urine.  She went to the emergency  room on Friday, April 25, 2004.   PAST MEDICAL HISTORY:  1. Liver cirrhosis with splenomegaly and pancytopenia.  2. History of Crohn disease.  3. Type 2 diabetes.  4. Hypertension.  5. Obesity.  6. Reactive depression.   SURGERY:  1. ERCP.  2. Cholecystectomy.   CURRENT MEDICATIONS:  1. Nexium b.i.d.  2. Celexa 40 mg one half daily.  3. Vasotec 20 mg b.i.d.  4. Glucophage 500 mg two b.i.d.  5. Glyburide 2.5 mg t.i.d.  6. Oxycodone p.r.n.   ALLERGIES:  __________  .   SOCIAL HISTORY:  She is married with one child.  Does not smoke or drink  alcohol.   FAMILY HISTORY:  Sister with Addison's disease.  Parents likely __________  disease with coronary artery disease.   REVIEW OF SYSTEMS:  No chest pain, no shortness of breath, no syncope.  Her  sister-in-law suspects that she was taking an excessive amount of oxycodone  to decrease pain.  Not depressed or suicidal.  The rest is as above or  negative.   PHYSICAL EXAMINATION:  VITAL SIGNS:  Blood pressure 160/110, pulse 100,  temperature 101.2, weight 214 pounds.  GENERAL:  She was in no acute distress.  She is hard of hearing.  HEENT:  Moist mucosa.  NECK:  Supple.  No thyromegaly or bruit.  LUNGS:  Clear.  No wheezes or rales.  HEART:  S1 S2.  No murmur.  Distant tones.  ABDOMEN:  Soft.  Slightly distended.  Tender diffusely more in the right  upper quadrant.  No rebound symptoms.  No masses felt.  LOWER EXTREMITIES:  Without edema.  NEUROLOGIC:  She is alert, oriented, and cooperative.  Denies being  depressed at present.   LABS:  Ultrasound, on April 24, 2004, with dilated common bile duct 1.4-cm,  splenomegaly.   ASSESSMENT/PLAN:  1. Fever, unclear etiology, refractory to oral antibiotics.  Originally she     was treated for pyelonephritis.  Took Cipro with side effects.  Switched     to [employer].  2. Abdominal pain.  Obtain gastroenterology consult.  Computed tomography of     the abdomen.  3. Over use of oxycodone due to pain, unintentional.  I discussed with her     sister-in-law.  4. Lower extremity edema, chronic, improved now.  5. Liver cirrhosis.  She is followed by Dr. Charlies Constable.  6. Pyelonephritis.  Plan as above.  7. Low back pain, chronic.  We will obtain x-ray to rule out compression     fracture.                                                Georgina Quint. Plotnikov, M.D. [facility]    AVP/MEDQ  D:  04/30/2004  T:  04/30/2004  Job:  [id number]   cc:   Charlies Constable, M.D. [facility]

## 2011-02-20 NOTE — Discharge Summary (Signed)
NAME:  [name], [name]                  ACCOUNT NO.:  [id number]   MEDICAL RECORD NO.:  [id number]          PATIENT TYPE:  OBV   LOCATION:  6738                         FACILITY:  [facility]   PHYSICIAN:  Valerie A. Felicity Coyer, MDDATE OF BIRTH:  07-Jul-1946   DATE OF ADMISSION:  12/07/2006  DATE OF DISCHARGE:  12/09/2006                               DISCHARGE SUMMARY   DISCHARGE DIAGNOSES:  1. Weakness likely multifactorial in the setting of anemia, liver      cirrhosis, and depression.  2. Anemia with thrombocytopenia.  3. 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  4. Diabetes type 2, poorly controlled.   HISTORY OF PRESENT ILLNESS:  [name] is a 65 year old white female who  was admitted on December 07, 2006 with chief complaint of weakness.  She was  found to have a hemoglobin of 8.8 on the day of admission and was  admitted for observation and blood transfusion.  The patient also has a  known history of NASH as well as depression and chronic abdominal pain.   PAST MEDICAL HISTORY:  1. Recurrent anemia.  2. Thrombocytopenia.  3. Liver cirrhosis secondary to NASH.  4. Chronic depression.  5. Diabetes.  6. Chronic low back pain.  7. Insomnia.   COURSE OF HOSPITALIZATION:  Problem 1. WEAKNESS.  The patient was  transfused 2 units of packed red blood cells for anemia.  She was noted  to have thrombocytopenia on admission with a platelet count of 37,000,  this rose slightly to 42,000 after 2 units of packed red blood cells;  however, the patient's weakness is likely multifactorial as she is also  chronically depressed and has a history of underlying liver cirrhosis.   Problem 2. LIVER CIRRHOSIS SECONDARY TO NONALCOHOLIC STEATOHEPATITIS.  The patient was noted to have anasarca with a depressed albumin level of  2.4.  She was noted to have prolonged PT as well as prolonged INR.  She  is also noted to have an elevated ammonia level of 64; however, she is  not encephalopathic  at this time.  She was seen by Dr. Claudette Head of  [facility] who recommended outpatient followup and management of  diuretics as an outpatient.  Per GI office the patient is followed by  Dr. Lina Sar and a followup appointment has been scheduled with her  after discharge.  We will start a low dose of Lasix in addition to the  patient's spironolactone.   Problem 3. DIABETES TYPE 2, POORLY CONTROLLED.  The patient was noted to  have a hemoglobin A1c of 8.8.  She was treated with sliding scale  insulin and Lantus insulin during this admission, however doubt the  patient will be compliant with insulin after discharge and metformin is  contraindicated secondary to her liver disease.  At this time plan to  continue Amaryl at same dose as prior to admission.  This will need  continued outpatient followup.   PERTINENT LABORATORIES AT TIME OF DISCHARGE:  Hemoglobin 10.8,  hematocrit 30.9, albumin 2.4, ammonia 64, INR 1.3, BUN 12, creatinine  0.7.   MEDICATIONS AT  TIME OF DISCHARGE:  1. Aldactone 100 mg p.o. daily.  2. Lexapro 10 mg p.o. daily.  3. Amaryl 4 mg p.o. b.i.d.  4. Vasotec 20 mg p.o. daily.  5. Ativan 2 mg p.o. b.i.d.  6. Wellbutrin 300 mg p.o. daily.  7. Nexium 40 mg p.o. daily.  8. Morphine sulfate IR 15 mg p.o. q.8 h. p.r.n.  9. Lasix 20 mg p.o. daily.   DISPOSITION:  The patient will be discharged to home.   FOLLOW UP:  The patient is scheduled to follow up with Dr. Sonda Primes on December 17, 2006 at 11 a.m. and to follow up with Dr. Juanda Chance  on January 10, 2007 at 8:30 a.m.      [name] Craze, NP      [name]. Felicity Coyer, MD  Electronically Signed    MO/MEDQ  D:  12/09/2006  T:  12/09/2006  Job:  [id number]   cc:   Georgina Quint. Plotnikov, MD  Hedwig Morton. Juanda Chance, MD

## 2011-03-03 ENCOUNTER — Other Ambulatory Visit: Payer: Self-pay | Admitting: Internal Medicine

## 2011-03-21 ENCOUNTER — Other Ambulatory Visit: Payer: Self-pay | Admitting: Internal Medicine

## 2011-04-01 ENCOUNTER — Ambulatory Visit (INDEPENDENT_AMBULATORY_CARE_PROVIDER_SITE_OTHER): Payer: Medicare Other | Admitting: Internal Medicine

## 2011-04-01 ENCOUNTER — Encounter: Payer: Self-pay | Admitting: Internal Medicine

## 2011-04-01 DIAGNOSIS — M79609 Pain in unspecified limb: Secondary | ICD-10-CM

## 2011-04-01 DIAGNOSIS — M25519 Pain in unspecified shoulder: Secondary | ICD-10-CM

## 2011-04-01 DIAGNOSIS — R202 Paresthesia of skin: Secondary | ICD-10-CM

## 2011-04-01 DIAGNOSIS — D696 Thrombocytopenia, unspecified: Secondary | ICD-10-CM

## 2011-04-01 DIAGNOSIS — M542 Cervicalgia: Secondary | ICD-10-CM

## 2011-04-01 DIAGNOSIS — M25511 Pain in right shoulder: Secondary | ICD-10-CM

## 2011-04-01 DIAGNOSIS — R51 Headache: Secondary | ICD-10-CM

## 2011-04-01 DIAGNOSIS — M545 Low back pain, unspecified: Secondary | ICD-10-CM

## 2011-04-01 DIAGNOSIS — E119 Type 2 diabetes mellitus without complications: Secondary | ICD-10-CM

## 2011-04-01 DIAGNOSIS — R209 Unspecified disturbances of skin sensation: Secondary | ICD-10-CM

## 2011-04-01 MED ORDER — VOLTAREN 1 % TD GEL: 1.0000 "application " | Freq: Four times a day (QID) | TRANSDERMAL | Status: DC

## 2011-04-01 MED ORDER — TRAMADOL HCL 50 MG PO TABS: 50.0000 mg | ORAL_TABLET | Freq: Two times a day (BID) | ORAL | Status: AC | PRN

## 2011-04-01 MED ORDER — FESOTERODINE FUMARATE ER 4 MG PO TB24: 4.0000 mg | ORAL_TABLET | Freq: Every day | ORAL | Status: DC

## 2011-04-01 MED ORDER — MELOXICAM 7.5 MG PO TABS: 7.5000 mg | ORAL_TABLET | Freq: Every day | ORAL | Status: DC

## 2011-04-01 NOTE — Progress Notes (Signed)
  Subjective:    Patient ID: [name], female    DOB: 1946/04/20, 65 y.o.   MRN: [id number]  HPI  C/o R eye pain after laser surgery 7 wks ago C/o HA x 4 wks - severe C/o L shoulder pain - severe, can't sleep  Review of Systems     Objective:   Physical Exam  Constitutional: She appears well-developed and well-nourished. No distress.  HENT:  Head: Normocephalic.  Right Ear: External ear normal.  Left Ear: External ear normal.  Nose: Nose normal.  Mouth/Throat: Oropharynx is clear and moist.  Eyes: Conjunctivae are normal. Pupils are equal, round, and reactive to light. Right eye exhibits no discharge. Left eye exhibits no discharge.  Neck: Normal range of motion. Neck supple. No JVD present. No tracheal deviation present. No thyromegaly present.  Cardiovascular: Normal rate, regular rhythm and normal heart sounds.   Pulmonary/Chest: No stridor. No respiratory distress. She has no wheezes.  Abdominal: Soft. Bowel sounds are normal. She exhibits no distension and no mass. There is no tenderness. There is no rebound and no guarding.  Musculoskeletal: She exhibits tenderness. She exhibits no edema.       Finger pressure is very tender over neck, torso, B shoulders B  Lymphadenopathy:    She has no cervical adenopathy.  Neurological: She displays normal reflexes. No cranial nerve deficit. She exhibits normal muscle tone. Coordination normal.  Skin: No rash noted. No erythema.       No rash  Psychiatric: Her behavior is normal. Judgment and thought content normal.  Tearful some     Lab Results  Component Value Date   WBC 2.6* 04/02/2011   HGB 13.8 04/02/2011   HCT 39.4 04/02/2011   PLT 38.0 Repeated and verified X2.* 04/02/2011   CHOL 159 06/13/2009   TRIG 37.0 06/13/2009   HDL 66.50 06/13/2009   ALT 50* 04/02/2011   AST 60* 04/02/2011   NA 133* 04/02/2011   NA 133* 04/02/2011   K 4.0 04/02/2011   K 4.0 04/02/2011   CL 100 04/02/2011   CL 100 04/02/2011   CREATININE 0.7 04/02/2011   CREATININE 0.7 04/02/2011   BUN 14 04/02/2011   BUN 14 04/02/2011   CO2 27 04/02/2011   CO2 27 04/02/2011   TSH 1.93 04/02/2011   INR 1.1* 04/02/2011   HGBA1C 12.0* 04/02/2011      Assessment & Plan:    A complex case

## 2011-04-02 ENCOUNTER — Telehealth: Payer: Self-pay | Admitting: Internal Medicine

## 2011-04-02 ENCOUNTER — Other Ambulatory Visit (INDEPENDENT_AMBULATORY_CARE_PROVIDER_SITE_OTHER): Payer: Medicare Other

## 2011-04-02 ENCOUNTER — Other Ambulatory Visit (INDEPENDENT_AMBULATORY_CARE_PROVIDER_SITE_OTHER): Payer: Medicare Other | Admitting: Internal Medicine

## 2011-04-02 DIAGNOSIS — R51 Headache: Secondary | ICD-10-CM

## 2011-04-02 DIAGNOSIS — Z79899 Other long term (current) drug therapy: Secondary | ICD-10-CM

## 2011-04-02 DIAGNOSIS — M542 Cervicalgia: Secondary | ICD-10-CM

## 2011-04-02 DIAGNOSIS — R202 Paresthesia of skin: Secondary | ICD-10-CM

## 2011-04-02 DIAGNOSIS — E119 Type 2 diabetes mellitus without complications: Secondary | ICD-10-CM

## 2011-04-02 DIAGNOSIS — M25511 Pain in right shoulder: Secondary | ICD-10-CM

## 2011-04-02 DIAGNOSIS — M25519 Pain in unspecified shoulder: Secondary | ICD-10-CM

## 2011-04-02 DIAGNOSIS — T887XXA Unspecified adverse effect of drug or medicament, initial encounter: Secondary | ICD-10-CM

## 2011-04-02 DIAGNOSIS — R209 Unspecified disturbances of skin sensation: Secondary | ICD-10-CM

## 2011-04-02 DIAGNOSIS — D696 Thrombocytopenia, unspecified: Secondary | ICD-10-CM

## 2011-04-02 DIAGNOSIS — K7460 Unspecified cirrhosis of liver: Secondary | ICD-10-CM

## 2011-04-02 LAB — CBC WITH DIFFERENTIAL/PLATELET
Basophils Absolute: 0 10*3/uL (ref 0.0–0.1)
Basophils Relative: 0.5 % (ref 0.0–3.0)
Eosinophils Relative: 7.3 % — ABNORMAL HIGH (ref 0.0–5.0)
HCT: 39.4 % (ref 36.0–46.0)
Hemoglobin: 13.8 g/dL (ref 12.0–15.0)
Lymphocytes Relative: 26.2 % (ref 12.0–46.0)
Lymphs Abs: 0.7 10*3/uL (ref 0.7–4.0)
Monocytes Relative: 9.2 % (ref 3.0–12.0)
Neutro Abs: 1.5 10*3/uL (ref 1.4–7.7)
RBC: 4.16 Mil/uL (ref 3.87–5.11)
RDW: 17.3 % — ABNORMAL HIGH (ref 11.5–14.6)

## 2011-04-02 LAB — URINALYSIS, ROUTINE W REFLEX MICROSCOPIC
Nitrite: NEGATIVE
Total Protein, Urine: NEGATIVE
Urobilinogen, UA: 1 (ref 0.0–1.0)

## 2011-04-02 LAB — COMPREHENSIVE METABOLIC PANEL
ALT: 50 U/L — ABNORMAL HIGH (ref 0–35)
BUN: 14 mg/dL (ref 6–23)
CO2: 27 mEq/L (ref 19–32)
Calcium: 9.4 mg/dL (ref 8.4–10.5)
Creatinine, Ser: 0.7 mg/dL (ref 0.4–1.2)
GFR: 86.86 mL/min (ref >60.00)
Total Bilirubin: 1.7 mg/dL — ABNORMAL HIGH (ref 0.3–1.2)

## 2011-04-02 LAB — BASIC METABOLIC PANEL
BUN: 14 mg/dL (ref 6–23)
CO2: 27 mEq/L (ref 19–32)
Calcium: 9.4 mg/dL (ref 8.4–10.5)
Creatinine, Ser: 0.7 mg/dL (ref 0.4–1.2)

## 2011-04-02 LAB — PROTIME-INR
INR: 1.1 ratio — ABNORMAL HIGH (ref 0.8–1.0)
Prothrombin Time: 12.4 s (ref 10.2–12.4)

## 2011-04-02 LAB — VITAMIN B12: Vitamin B-12: 689 pg/mL (ref 211–911)

## 2011-04-02 LAB — URIC ACID: Uric Acid, Serum: 3.3 mg/dL (ref 2.4–7.0)

## 2011-04-02 LAB — CK: Total CK: 53 U/L (ref 7–177)

## 2011-04-02 NOTE — Telephone Encounter (Signed)
[name], please, inform patient that all labs are ok except for a very poor DM control. What is she using for meds? PLTs are low as before Thx

## 2011-04-03 NOTE — Telephone Encounter (Signed)
Called pt. Tried to inform of below. She has impaired hearing and asked to call me back so she can use her machine to hear me better.

## 2011-04-03 NOTE — Telephone Encounter (Signed)
Pt informed. She states she is only using Humalog 14 units QID. Please advise

## 2011-04-03 NOTE — Telephone Encounter (Signed)
Use 16-20 units qid depending on meal size. RTC 2 wks Thx

## 2011-04-04 ENCOUNTER — Encounter: Payer: Self-pay | Admitting: Internal Medicine

## 2011-04-04 NOTE — Assessment & Plan Note (Signed)
Monitoring

## 2011-04-04 NOTE — Assessment & Plan Note (Signed)
Worse. See Meds 

## 2011-04-04 NOTE — Assessment & Plan Note (Signed)
See Meds 

## 2011-04-04 NOTE — Assessment & Plan Note (Signed)
Poor control. Discussed

## 2011-04-06 NOTE — Telephone Encounter (Signed)
Called pt to inform of below.  No answer. Will try later.

## 2011-04-07 NOTE — Telephone Encounter (Signed)
Called pt no answer °

## 2011-04-07 NOTE — Telephone Encounter (Signed)
Spoke with patient and gave MD advisement. Patient states that any time she uses 16 units or more that it causes her nose to bleed. She also went on to say that "20 units will kill her". I confirmed with patient that she has been taken 14 units 6 times a day with meals.

## 2011-04-17 ENCOUNTER — Ambulatory Visit (INDEPENDENT_AMBULATORY_CARE_PROVIDER_SITE_OTHER): Payer: Medicare Other | Admitting: Internal Medicine

## 2011-04-17 ENCOUNTER — Encounter: Payer: Self-pay | Admitting: Internal Medicine

## 2011-04-17 VITALS — BP 140/90 | HR 72 | Temp 97.2°F | Resp 16

## 2011-04-17 DIAGNOSIS — K7460 Unspecified cirrhosis of liver: Secondary | ICD-10-CM

## 2011-04-17 DIAGNOSIS — M5030 Other cervical disc degeneration, unspecified cervical region: Secondary | ICD-10-CM

## 2011-04-17 DIAGNOSIS — L84 Corns and callosities: Secondary | ICD-10-CM | POA: Insufficient documentation

## 2011-04-17 DIAGNOSIS — E119 Type 2 diabetes mellitus without complications: Secondary | ICD-10-CM

## 2011-04-17 MED ORDER — GABAPENTIN 400 MG PO CAPS: 400.0000 mg | ORAL_CAPSULE | Freq: Four times a day (QID) | ORAL | Status: DC

## 2011-04-17 MED ORDER — INSULIN LISPRO 100 UNIT/ML ~~LOC~~ SOLN: 20.0000 [IU] | Freq: Three times a day (TID) | SUBCUTANEOUS | Status: DC

## 2011-04-17 MED ORDER — BUPROPION HCL ER (SR) 150 MG PO TB12: 150.0000 mg | ORAL_TABLET | ORAL | Status: DC

## 2011-04-17 NOTE — Assessment & Plan Note (Signed)
Labs reviewed w/pt  Lab Results  Component Value Date   WBC 2.6* 04/02/2011   HGB 13.8 04/02/2011   HCT 39.4 04/02/2011   PLT 38.0 Repeated and verified X2.* 04/02/2011   CHOL 159 06/13/2009   TRIG 37.0 06/13/2009   HDL 66.50 06/13/2009   ALT 50* 04/02/2011   AST 60* 04/02/2011   NA 133* 04/02/2011   NA 133* 04/02/2011   K 4.0 04/02/2011   K 4.0 04/02/2011   CL 100 04/02/2011   CL 100 04/02/2011   CREATININE 0.7 04/02/2011   CREATININE 0.7 04/02/2011   BUN 14 04/02/2011   BUN 14 04/02/2011   CO2 27 04/02/2011   CO2 27 04/02/2011   TSH 1.93 04/02/2011   INR 1.1* 04/02/2011   HGBA1C 12.0* 04/02/2011

## 2011-04-17 NOTE — Progress Notes (Signed)
  Subjective:    Patient ID: [name], female    DOB: 04/03/1946, 65 y.o.   MRN: [id number]  HPI   The patient is here to follow up on chronic depression, anxiety, headaches and chronic moderate R neck pain symptoms controlled some with Gabapentin. F/u on labs. C/o painful calluces   Review of Systems  Constitutional: Positive for fatigue. Negative for fever, chills, activity change, appetite change and unexpected weight change.  HENT: Negative for congestion, mouth sores and sinus pressure.   Eyes: Negative for visual disturbance.  Respiratory: Negative for cough and chest tightness.   Gastrointestinal: Negative for nausea and abdominal pain.  Genitourinary: Negative for frequency, difficulty urinating and vaginal pain.  Musculoskeletal: Positive for myalgias, back pain, arthralgias and gait problem.  Skin: Negative for pallor and rash.  Neurological: Negative for dizziness, tremors, weakness, numbness and headaches.  Hematological: Negative for adenopathy. Bruises/bleeds easily.  Psychiatric/Behavioral: Negative for suicidal ideas, hallucinations, confusion and sleep disturbance.       Objective:   Physical Exam  Constitutional: She appears well-developed and well-nourished. No distress.       Obese NAD  HENT:  Head: Normocephalic.  Right Ear: External ear normal.  Left Ear: External ear normal.  Nose: Nose normal.  Mouth/Throat: Oropharynx is clear and moist.  Eyes: Conjunctivae are normal. Pupils are equal, round, and reactive to light. Right eye exhibits no discharge. Left eye exhibits no discharge.  Neck: Normal range of motion. Neck supple. No JVD present. No tracheal deviation present. No thyromegaly present.  Cardiovascular: Normal rate, regular rhythm and normal heart sounds.   Pulmonary/Chest: No stridor. No respiratory distress. She has no wheezes.  Abdominal: Soft. Bowel sounds are normal. She exhibits no distension and no mass. There is no tenderness. There is no  rebound and no guarding.  Musculoskeletal: She exhibits no edema and no tenderness.  Lymphadenopathy:    She has no cervical adenopathy.  Neurological: She displays normal reflexes. No cranial nerve deficit. She exhibits normal muscle tone. Coordination normal.  Skin: No rash noted. No erythema.       Small bruises  Psychiatric: She has a normal mood and affect. Her behavior is normal. Judgment and thought content normal.          Assessment & Plan:

## 2011-04-17 NOTE — Assessment & Plan Note (Addendum)
Procedure: Callus/hyperkeratosis trimming  Indication: Painful lesions, pre-ulcerous  Potential benefits of this procedure  as well as potential risks  and complications ( infection, bleeding etc.) were explained to the patient and were aknowledged. Oral consent was obtained.  Lesion(s) x2 were treated with Betadine and alcohol and trimmed down to dermal surface with a #15 round blade. Tolerated well. Complications: none.

## 2011-04-17 NOTE — Assessment & Plan Note (Signed)
Better on Gabapentin See Meds

## 2011-04-17 NOTE — Assessment & Plan Note (Signed)
Better on higher Humalog units - see Rx

## 2011-04-23 ENCOUNTER — Other Ambulatory Visit: Payer: Self-pay | Admitting: Internal Medicine

## 2011-04-24 ENCOUNTER — Telehealth: Payer: Self-pay | Admitting: *Deleted

## 2011-04-24 NOTE — Telephone Encounter (Signed)
Ok To Rf?  Earney Navy - 7:10 PM More Detail >>      Rx Auth Request      Surescripts In Interface        Sent: Thu April 23, 2011  7:10 PM    To: Lysbeth Galas Plotnikov Rx Refill        Solae Norling  65 y.o. / Female ([birth year])  Pharmacy: [id number] [place], [place] - [address] Ph: [phone]   MRN: [id number]  PCP: Sonda Primes, MD  Wt: 214 lb (97.07 kg) (04/01/2011)   Home: [phone]  Work: Not available  Mobile: Not available       Requested Medications     benzonatate (TESSALON) 100 MG capsule [Pharmacy Med Name: BENZONATATE 100 MG CAPSULE]    The source prescription has been discontinued.    TAKE 1 CAPSULE BY MOUTH 3 TIMES DAILY AS NEEDED FOR COUGH.    Disp: 120 capsule R: 1 Start: 04/23/2011 Class: Normal    Originally ordered on: 12/31/2010 Last refill: 03/21/2011 Order History       Contacts         Type Contact Phone    04/23/2011  7:10 PM Interface (Incoming) [id number] [phone]      Allergies as of 04/23/2011   Date Reviewed: 04/17/2011        Noted Type Reactions    Azithromycin 08/26/2007              Ciprofloxacin 06/25/2009         REACTION: swelling of LE         UXL:KGMWNUUVOZD+GUYQIHKVQ+QVZDGLOVFI Acid+Aspartame 07/31/2009         REACTION: diarrhea         Metformin 08/26/2007         REACTION: Diarrhea         Sulfamethoxazole W/Trimethoprim 07/01/2010         REACTION: leg edema         Tetracycline Hcl 08/26/2007              Tramadol 04/17/2011   Intolerance     HAs           Patient Flags     No FYI flags for this patient      Future Appointments            Date & Time Provider Department Center    06/23/2011 1:45 PM Sonda Primes, MD [facility]           Pharmacy     [id number] [place], [place] - [address]    [address] [place] [address]    Phone: [phone] Fax: [phone]

## 2011-04-25 NOTE — Telephone Encounter (Signed)
OK to fill this prescription with additional refills x2 Thank you!  

## 2011-04-27 MED ORDER — BENZONATATE 100 MG PO CAPS: 100.0000 mg | ORAL_CAPSULE | Freq: Three times a day (TID) | ORAL | Status: DC | PRN

## 2011-04-28 ENCOUNTER — Ambulatory Visit: Payer: Medicare Other | Admitting: Internal Medicine

## 2011-05-25 ENCOUNTER — Other Ambulatory Visit: Payer: Self-pay | Admitting: Internal Medicine

## 2011-06-18 ENCOUNTER — Telehealth: Payer: Self-pay | Admitting: *Deleted

## 2011-06-18 ENCOUNTER — Other Ambulatory Visit (INDEPENDENT_AMBULATORY_CARE_PROVIDER_SITE_OTHER): Payer: Medicare Other

## 2011-06-18 DIAGNOSIS — D696 Thrombocytopenia, unspecified: Secondary | ICD-10-CM

## 2011-06-18 DIAGNOSIS — T887XXA Unspecified adverse effect of drug or medicament, initial encounter: Secondary | ICD-10-CM

## 2011-06-18 DIAGNOSIS — K7460 Unspecified cirrhosis of liver: Secondary | ICD-10-CM

## 2011-06-18 DIAGNOSIS — E1165 Type 2 diabetes mellitus with hyperglycemia: Secondary | ICD-10-CM

## 2011-06-18 LAB — COMPREHENSIVE METABOLIC PANEL
ALT: 34 U/L (ref 0–35)
CO2: 27 mEq/L (ref 19–32)
Calcium: 8.9 mg/dL (ref 8.4–10.5)
Chloride: 109 mEq/L (ref 96–112)
Creatinine, Ser: 0.7 mg/dL (ref 0.4–1.2)
GFR: 86.8 mL/min (ref >60.00)
Total Protein: 6.4 g/dL (ref 6.0–8.3)

## 2011-06-18 LAB — CBC WITH DIFFERENTIAL/PLATELET
Basophils Relative: 0.3 % (ref 0.0–3.0)
Eosinophils Absolute: 0.2 10*3/uL (ref 0.0–0.7)
HCT: 41.5 % (ref 36.0–46.0)
Hemoglobin: 14 g/dL (ref 12.0–15.0)
Lymphocytes Relative: 24.8 % (ref 12.0–46.0)
Lymphs Abs: 0.5 10*3/uL — ABNORMAL LOW (ref 0.7–4.0)
MCHC: 33.7 g/dL (ref 30.0–36.0)
MCV: 97.7 fl (ref 78.0–100.0)
Neutro Abs: 1.2 10*3/uL — ABNORMAL LOW (ref 1.4–7.7)
RBC: 4.25 Mil/uL (ref 3.87–5.11)

## 2011-06-18 LAB — HEMOGLOBIN A1C: Hgb A1c MFr Bld: 8.9 % — ABNORMAL HIGH (ref 4.6–6.5)

## 2011-06-18 NOTE — Telephone Encounter (Signed)
Per Tonya in lab- pt's platelet count is 26. Pt's platelets usually run low but this is lower that last several times.

## 2011-06-18 NOTE — Telephone Encounter (Signed)
Thrombocytopenia established diagnosis. Dr. Posey Mcneice can review in AM

## 2011-06-19 NOTE — Telephone Encounter (Signed)
Noted Keep ROV Thx 

## 2011-06-23 ENCOUNTER — Ambulatory Visit (INDEPENDENT_AMBULATORY_CARE_PROVIDER_SITE_OTHER): Payer: Medicare Other | Admitting: Internal Medicine

## 2011-06-23 ENCOUNTER — Encounter: Payer: Self-pay | Admitting: Internal Medicine

## 2011-06-23 VITALS — BP 122/80 | HR 84 | Temp 99.4°F | Resp 16 | Wt 218.0 lb

## 2011-06-23 DIAGNOSIS — E119 Type 2 diabetes mellitus without complications: Secondary | ICD-10-CM

## 2011-06-23 DIAGNOSIS — K7460 Unspecified cirrhosis of liver: Secondary | ICD-10-CM

## 2011-06-23 DIAGNOSIS — M25569 Pain in unspecified knee: Secondary | ICD-10-CM

## 2011-06-23 DIAGNOSIS — Z23 Encounter for immunization: Secondary | ICD-10-CM

## 2011-06-23 DIAGNOSIS — D696 Thrombocytopenia, unspecified: Secondary | ICD-10-CM

## 2011-06-23 DIAGNOSIS — R51 Headache: Secondary | ICD-10-CM

## 2011-06-23 NOTE — Patient Instructions (Signed)
Call if more bruising or if bleeding

## 2011-06-23 NOTE — Assessment & Plan Note (Signed)
Worse, no signs of bleeding Lab Results  Component Value Date   PLT 26.0 Repeated and verified X2.* 06/18/2011  RTC 1 mo Call if more bruising or if bleeding

## 2011-06-23 NOTE — Assessment & Plan Note (Signed)
Better  

## 2011-06-24 ENCOUNTER — Encounter: Payer: Self-pay | Admitting: Internal Medicine

## 2011-06-24 NOTE — Assessment & Plan Note (Signed)
Better lately 

## 2011-06-24 NOTE — Assessment & Plan Note (Signed)
Continue with current prescription therapy as reflected on the Med list. Labs reviewed 

## 2011-06-24 NOTE — Assessment & Plan Note (Signed)
Treatment options are limited due to her thrombocytopenia, elevated INR and general poor state of health. Cont w/Voltaren topically

## 2011-06-24 NOTE — Progress Notes (Signed)
  Subjective:    Patient ID: [name], female    DOB: 1946/06/20, 65 y.o.   MRN: [id number]  HPI   The patient is here to follow up on chronic cirrhosis, B knee OA, depression, anxiety, headaches and chronic moderate pain symptoms    Review of Systems  Constitutional: Positive for fatigue. Negative for chills, activity change, appetite change and unexpected weight change.  HENT: Positive for hearing loss. Negative for nosebleeds, congestion, mouth sores, postnasal drip and sinus pressure.   Eyes: Negative for visual disturbance.  Respiratory: Negative for cough, chest tightness and wheezing.   Gastrointestinal: Negative for nausea, abdominal pain and anal bleeding.  Genitourinary: Negative for dysuria, frequency, enuresis, difficulty urinating and vaginal pain.  Musculoskeletal: Negative for back pain, joint swelling and gait problem.  Skin: Negative for pallor and rash.  Neurological: Negative for dizziness, tremors, weakness, numbness and headaches.  Hematological: Bruises/bleeds easily.  Psychiatric/Behavioral: Positive for behavioral problems. Negative for suicidal ideas, confusion and sleep disturbance. The patient is nervous/anxious.        Objective:   Physical Exam  Constitutional: She appears well-developed. No distress.       Obese  HENT:  Head: Normocephalic.  Right Ear: External ear normal.  Left Ear: External ear normal.  Nose: Nose normal.  Mouth/Throat: Oropharynx is clear and moist.       deaf  Eyes: Conjunctivae are normal. Pupils are equal, round, and reactive to light. Right eye exhibits no discharge. Left eye exhibits no discharge.  Neck: Normal range of motion. Neck supple. No JVD present. No tracheal deviation present. No thyromegaly present.  Cardiovascular: Normal rate, regular rhythm and normal heart sounds.   Pulmonary/Chest: No stridor. No respiratory distress. She has no wheezes.  Abdominal: Soft. Bowel sounds are normal. She exhibits no distension  and no mass. There is no tenderness. There is no rebound and no guarding.  Musculoskeletal: She exhibits tenderness (B knees are tender). She exhibits no edema.  Lymphadenopathy:    She has no cervical adenopathy.  Neurological: She displays normal reflexes. No cranial nerve deficit. She exhibits normal muscle tone. Coordination normal.  Skin: No rash noted. No erythema.       Mild bruising  Psychiatric: She has a normal mood and affect. Her behavior is normal. Judgment and thought content normal.    Lab Results  Component Value Date   WBC 2.1 Repeated and verified X2.* 06/18/2011   HGB 14.0 06/18/2011   HCT 41.5 06/18/2011   PLT 26.0 Repeated and verified X2.* 06/18/2011   CHOL 159 06/13/2009   TRIG 37.0 06/13/2009   HDL 66.50 06/13/2009   ALT 34 06/18/2011   AST 45* 06/18/2011   NA 142 06/18/2011   K 3.6 06/18/2011   CL 109 06/18/2011   CREATININE 0.7 06/18/2011   BUN 15 06/18/2011   CO2 27 06/18/2011   TSH 1.93 04/02/2011   INR 1.2* 06/18/2011   HGBA1C 8.9* 06/18/2011         Assessment & Plan:

## 2011-07-09 ENCOUNTER — Ambulatory Visit: Payer: Medicare Other | Admitting: Internal Medicine

## 2011-07-15 ENCOUNTER — Other Ambulatory Visit: Payer: Self-pay | Admitting: *Deleted

## 2011-07-15 MED ORDER — GLUCOSE BLOOD VI STRP: ORAL_STRIP | Status: DC

## 2011-07-17 ENCOUNTER — Other Ambulatory Visit: Payer: Self-pay | Admitting: Internal Medicine

## 2011-07-21 LAB — BASIC METABOLIC PANEL
BUN: 14
CO2: 25
Calcium: 9.5
Creatinine, Ser: 0.84
GFR calc Af Amer: >60
Glucose, Bld: 109 — ABNORMAL HIGH

## 2011-07-21 LAB — CK TOTAL AND CKMB (NOT AT ARMC)
Relative Index: INVALID
Total CK: 16

## 2011-07-21 LAB — I-STAT 8, (EC8 V) (CONVERTED LAB)
Bicarbonate: 20.7
Glucose, Bld: 182 — ABNORMAL HIGH
Glucose, Bld: 183 — ABNORMAL HIGH
HCT: 41
Hemoglobin: 13.9
Operator id: 146091
Potassium: 3.6
Sodium: 140
TCO2: 21
TCO2: 22
pH, Ven: 7.419 — ABNORMAL HIGH

## 2011-07-21 LAB — CARDIAC PANEL(CRET KIN+CKTOT+MB+TROPI)
Relative Index: INVALID
Relative Index: INVALID
Troponin I: 0.01

## 2011-07-21 LAB — LIPID PANEL
HDL: 39 — ABNORMAL LOW
Total CHOL/HDL Ratio: 3.3

## 2011-07-21 LAB — CBC
HCT: 35.8 — ABNORMAL LOW
Hemoglobin: 12.3
MCHC: 34.3
MCV: 90.9
RBC: 3.95

## 2011-07-21 LAB — POCT CARDIAC MARKERS
CKMB, poc: <1 — ABNORMAL LOW
Myoglobin, poc: 60.2

## 2011-07-21 LAB — POCT I-STAT CREATININE: Creatinine, Ser: 0.7

## 2011-08-11 ENCOUNTER — Telehealth: Payer: Self-pay | Admitting: *Deleted

## 2011-08-11 NOTE — Telephone Encounter (Signed)
Pt left vm stating she is in a lot of pain. She is requesting OV this wk. You have no openings in schedule until next wk. Ok to work in? If so, when?

## 2011-08-12 NOTE — Telephone Encounter (Signed)
OK Thursday 1 pm Thx 

## 2011-08-12 NOTE — Telephone Encounter (Signed)
Called pt no answer °

## 2011-08-12 NOTE — Telephone Encounter (Signed)
Pt called back and spoke to [place] S./scheduled for [place] at 1 pm.

## 2011-08-13 ENCOUNTER — Other Ambulatory Visit (INDEPENDENT_AMBULATORY_CARE_PROVIDER_SITE_OTHER): Payer: Medicare Other

## 2011-08-13 ENCOUNTER — Encounter: Payer: Self-pay | Admitting: Internal Medicine

## 2011-08-13 ENCOUNTER — Ambulatory Visit (INDEPENDENT_AMBULATORY_CARE_PROVIDER_SITE_OTHER): Payer: Medicare Other | Admitting: Internal Medicine

## 2011-08-13 ENCOUNTER — Telehealth: Payer: Self-pay | Admitting: Internal Medicine

## 2011-08-13 VITALS — BP 120/82 | HR 80 | Temp 97.9°F | Resp 16 | Wt 213.0 lb

## 2011-08-13 DIAGNOSIS — M797 Fibromyalgia: Secondary | ICD-10-CM

## 2011-08-13 DIAGNOSIS — K7460 Unspecified cirrhosis of liver: Secondary | ICD-10-CM

## 2011-08-13 DIAGNOSIS — B370 Candidal stomatitis: Secondary | ICD-10-CM

## 2011-08-13 DIAGNOSIS — IMO0001 Reserved for inherently not codable concepts without codable children: Secondary | ICD-10-CM

## 2011-08-13 DIAGNOSIS — D509 Iron deficiency anemia, unspecified: Secondary | ICD-10-CM

## 2011-08-13 DIAGNOSIS — L84 Corns and callosities: Secondary | ICD-10-CM

## 2011-08-13 LAB — BASIC METABOLIC PANEL
BUN: 15 mg/dL (ref 6–23)
CO2: 29 mEq/L (ref 19–32)
Calcium: 9.7 mg/dL (ref 8.4–10.5)
Creatinine, Ser: 0.8 mg/dL (ref 0.4–1.2)

## 2011-08-13 LAB — CBC WITH DIFFERENTIAL/PLATELET
Basophils Absolute: 0 10*3/uL (ref 0.0–0.1)
Basophils Relative: 0.2 % (ref 0.0–3.0)
Eosinophils Absolute: 0.3 10*3/uL (ref 0.0–0.7)
HCT: 43.9 % (ref 36.0–46.0)
Hemoglobin: 15.1 g/dL — ABNORMAL HIGH (ref 12.0–15.0)
Lymphs Abs: 0.8 10*3/uL (ref 0.7–4.0)
MCHC: 34.4 g/dL (ref 30.0–36.0)
Monocytes Relative: 8 % (ref 3.0–12.0)
Neutro Abs: 2.4 10*3/uL (ref 1.4–7.7)
RBC: 4.51 Mil/uL (ref 3.87–5.11)
RDW: 15.9 % — ABNORMAL HIGH (ref 11.5–14.6)

## 2011-08-13 LAB — HEPATIC FUNCTION PANEL
ALT: 38 U/L — ABNORMAL HIGH (ref 0–35)
Bilirubin, Direct: 0.6 mg/dL — ABNORMAL HIGH (ref 0.0–0.3)
Total Protein: 6.9 g/dL (ref 6.0–8.3)

## 2011-08-13 LAB — PROTIME-INR
INR: 1.1 ratio — ABNORMAL HIGH (ref 0.8–1.0)
Prothrombin Time: 12.4 s (ref 10.2–12.4)

## 2011-08-13 LAB — CK: Total CK: 31 U/L (ref 7–177)

## 2011-08-13 MED ORDER — FENTANYL 12 MCG/HR TD PT72: 1.0000 | MEDICATED_PATCH | TRANSDERMAL | Status: DC

## 2011-08-13 MED ORDER — NYSTATIN 100000 UNIT/ML MT SUSP: 500000.0000 [IU] | Freq: Four times a day (QID) | OROMUCOSAL | Status: AC

## 2011-08-13 NOTE — Assessment & Plan Note (Signed)
Nystatin Rx

## 2011-08-13 NOTE — Progress Notes (Signed)
  Subjective:    Patient ID: [name], female    DOB: [birth year], 65 y.o.   MRN: [id number]  HPI  C/o severe  Pains all over 10/10 in intensity all over her body. She had pains before but never that intense. C/o more pain in RUQ, neck, LS spine; achy and tired. F/u on low plt's and liver cirrhosis. No bleeding C/o painful calluses on R foot  Review of Systems  Constitutional: Positive for fatigue. Negative for chills, activity change, appetite change and unexpected weight change.  HENT: Negative for congestion, mouth sores and sinus pressure.   Eyes: Negative for visual disturbance.  Respiratory: Negative for cough and chest tightness.   Gastrointestinal: Negative for nausea, abdominal pain, diarrhea and constipation.  Genitourinary: Negative for frequency, difficulty urinating and vaginal pain.  Musculoskeletal: Positive for myalgias, back pain and arthralgias. Negative for joint swelling and gait problem.  Skin: Negative for pallor and rash.  Neurological: Negative for dizziness, tremors, weakness, numbness and headaches.  Psychiatric/Behavioral: Negative for suicidal ideas, confusion and sleep disturbance. The patient is nervous/anxious.        Objective:   Physical Exam  Constitutional: She appears well-developed. No distress.       Obese  HENT:  Head: Normocephalic.  Right Ear: External ear normal.  Left Ear: External ear normal.  Nose: Nose normal.       thrush  Eyes: Conjunctivae are normal. Pupils are equal, round, and reactive to light. Right eye exhibits no discharge. Left eye exhibits no discharge.  Neck: Normal range of motion. Neck supple. No JVD present. No tracheal deviation present. No thyromegaly present.  Cardiovascular: Normal rate, regular rhythm and normal heart sounds.   Pulmonary/Chest: No stridor. No respiratory distress. She has no wheezes.  Abdominal: Soft. Bowel sounds are normal. She exhibits no distension and no mass. There is tenderness. There is no  rebound and no guarding.  Musculoskeletal: She exhibits tenderness (all over). She exhibits no edema.  Lymphadenopathy:    She has no cervical adenopathy.  Neurological: She displays normal reflexes. No cranial nerve deficit. She exhibits normal muscle tone. Coordination normal.  Skin: No rash noted. No erythema.  Psychiatric: She has a normal mood and affect. Her behavior is normal. Judgment and thought content normal.  Calluces - thick Lab Results  Component Value Date   WBC 2.1 Repeated and verified X2.* 06/18/2011   HGB 14.0 06/18/2011   HCT 41.5 06/18/2011   PLT 26.0 Repeated and verified X2.* 06/18/2011   GLUCOSE 184* 06/18/2011   CHOL 159 06/13/2009   TRIG 37.0 06/13/2009   HDL 66.50 06/13/2009   LDLCALC 85 06/13/2009   ALT 34 06/18/2011   AST 45* 06/18/2011   NA 142 06/18/2011   K 3.6 06/18/2011   CL 109 06/18/2011   CREATININE 0.7 06/18/2011   BUN 15 06/18/2011   CO2 27 06/18/2011   TSH 1.93 04/02/2011   INR 1.2* 06/18/2011   HGBA1C 8.9* 06/18/2011    Procedure:  R  foot callus x2 paring/cutting  Indication:   R foot callus x2, painful  Consent: verbal  Risks and benefits were explained to the patient. Skin was cleaned with alcohol. I removed a large callus carefully with a round blade. Skin remained intact. Pain is better. Tolerated well. Complications: none        Assessment & Plan:    A complex case

## 2011-08-13 NOTE — Assessment & Plan Note (Signed)
Will get labs 

## 2011-08-13 NOTE — Assessment & Plan Note (Signed)
Worse in 11/12 - severe pains  Potential benefits of a long term opioids use as well as potential risks (i.e. addiction risk, apnea etc) and complications (i.e. Somnolence, constipation and others) were explained to the patient and were aknowledged. Start Duragesic at low dose Cont Tramadol

## 2011-08-13 NOTE — Assessment & Plan Note (Signed)
Check CBC 

## 2011-08-13 NOTE — Assessment & Plan Note (Signed)
R painful - see procedure

## 2011-08-13 NOTE — Telephone Encounter (Signed)
[name], please, inform patient that all labs are about the same, plt's are better Thx

## 2011-08-14 NOTE — Telephone Encounter (Signed)
Pt informed

## 2011-08-14 NOTE — Telephone Encounter (Signed)
Called pt to inform per MD// No answer or VM

## 2011-08-21 ENCOUNTER — Telehealth: Payer: Self-pay | Admitting: Internal Medicine

## 2011-08-21 MED ORDER — FLUCONAZOLE 150 MG PO TABS: 150.0000 mg | ORAL_TABLET | Freq: Once | ORAL | Status: AC

## 2011-08-21 NOTE — Telephone Encounter (Signed)
[name] is going out of town due to the death of her brother.  She has a yeast infection.  She is requesting something to be called in to her drugstore [phone]) for the yeast infection.  Please call her to let her know.

## 2011-08-21 NOTE — Telephone Encounter (Signed)
Per Dr. Roena Malady- ok for Diflucan 150mg  1 po qd # 1. Tried to call pt to advise Rx sent. No answer/machine.

## 2011-08-26 ENCOUNTER — Ambulatory Visit: Payer: Medicare Other | Admitting: Internal Medicine

## 2011-09-01 ENCOUNTER — Other Ambulatory Visit (INDEPENDENT_AMBULATORY_CARE_PROVIDER_SITE_OTHER): Payer: Medicare Other

## 2011-09-01 DIAGNOSIS — D696 Thrombocytopenia, unspecified: Secondary | ICD-10-CM

## 2011-09-01 DIAGNOSIS — E119 Type 2 diabetes mellitus without complications: Secondary | ICD-10-CM

## 2011-09-01 LAB — COMPREHENSIVE METABOLIC PANEL
ALT: 37 U/L — ABNORMAL HIGH (ref 0–35)
AST: 41 U/L — ABNORMAL HIGH (ref 0–37)
CO2: 25 mEq/L (ref 19–32)
Calcium: 9 mg/dL (ref 8.4–10.5)
Chloride: 106 mEq/L (ref 96–112)
Creatinine, Ser: 0.9 mg/dL (ref 0.4–1.2)
GFR: 69.72 mL/min (ref >60.00)
Potassium: 3.7 mEq/L (ref 3.5–5.1)
Sodium: 138 mEq/L (ref 135–145)
Total Protein: 6.5 g/dL (ref 6.0–8.3)

## 2011-09-01 LAB — AMMONIA: Ammonia: 35 umol/L (ref 11–35)

## 2011-09-02 ENCOUNTER — Telehealth: Payer: Self-pay | Admitting: *Deleted

## 2011-09-02 LAB — CBC WITH DIFFERENTIAL/PLATELET
Basophils Absolute: 0 10*3/uL (ref 0.0–0.1)
Eosinophils Absolute: 0.2 10*3/uL (ref 0.0–0.7)
Hemoglobin: 13.5 g/dL (ref 12.0–15.0)
Lymphocytes Relative: 30.6 % (ref 12.0–46.0)
MCHC: 34.7 g/dL (ref 30.0–36.0)
Monocytes Relative: 9.9 % (ref 3.0–12.0)
Neutro Abs: 1.1 10*3/uL — ABNORMAL LOW (ref 1.4–7.7)
Neutrophils Relative %: 51.9 % (ref 43.0–77.0)
Platelets: 32 10*3/uL — CL (ref 150.0–400.0)
RDW: 16.6 % — ABNORMAL HIGH (ref 11.5–14.6)

## 2011-09-02 NOTE — Telephone Encounter (Signed)
Pt's platelet count is critical at 32.0. AVP notified of this/reviewed labs. No new advisement for pt at this time.

## 2011-09-04 ENCOUNTER — Ambulatory Visit (INDEPENDENT_AMBULATORY_CARE_PROVIDER_SITE_OTHER): Payer: Medicare Other | Admitting: Internal Medicine

## 2011-09-04 ENCOUNTER — Encounter: Payer: Self-pay | Admitting: Internal Medicine

## 2011-09-04 VITALS — BP 130/90 | Temp 98.1°F | Wt 213.0 lb

## 2011-09-04 DIAGNOSIS — F3289 Other specified depressive episodes: Secondary | ICD-10-CM

## 2011-09-04 DIAGNOSIS — E119 Type 2 diabetes mellitus without complications: Secondary | ICD-10-CM

## 2011-09-04 DIAGNOSIS — K7460 Unspecified cirrhosis of liver: Secondary | ICD-10-CM

## 2011-09-04 DIAGNOSIS — D696 Thrombocytopenia, unspecified: Secondary | ICD-10-CM

## 2011-09-04 DIAGNOSIS — F329 Major depressive disorder, single episode, unspecified: Secondary | ICD-10-CM

## 2011-09-04 NOTE — Progress Notes (Signed)
  Subjective:    Patient ID: [name], female    DOB: Nov 11, 1945, 65 y.o.   MRN: [id number]  HPI  The patient presents for a follow-up of  Chronic cirrhosis, hypertension, chronic dyslipidemia, type 2 diabetes controlled with medicines    Review of Systems  Constitutional: Positive for fatigue. Negative for chills, activity change, appetite change and unexpected weight change.  HENT: Negative for congestion, mouth sores and sinus pressure.   Eyes: Negative for visual disturbance.  Respiratory: Negative for cough and chest tightness.   Gastrointestinal: Negative for nausea and abdominal pain.  Genitourinary: Negative for frequency, difficulty urinating and vaginal pain.  Musculoskeletal: Positive for arthralgias and gait problem. Negative for back pain.  Skin: Negative for pallor and rash.  Neurological: Negative for dizziness, tremors, weakness, numbness and headaches.  Psychiatric/Behavioral: Negative for confusion and sleep disturbance.       Objective:   Physical Exam  Constitutional: She appears well-developed. No distress.       obese  HENT:  Head: Normocephalic.  Right Ear: External ear normal.  Left Ear: External ear normal.  Nose: Nose normal.  Mouth/Throat: Oropharynx is clear and moist.  Eyes: Conjunctivae are normal. Pupils are equal, round, and reactive to light. Right eye exhibits no discharge. Left eye exhibits no discharge.  Neck: Normal range of motion. Neck supple. No JVD present. No tracheal deviation present. No thyromegaly present.  Cardiovascular: Normal rate, regular rhythm and normal heart sounds.   Pulmonary/Chest: No stridor. No respiratory distress. She has no wheezes.  Abdominal: Soft. Bowel sounds are normal. She exhibits no distension and no mass. There is no tenderness. There is no rebound and no guarding.  Musculoskeletal: She exhibits edema (trace) and tenderness (B knees).  Lymphadenopathy:    She has no cervical adenopathy.  Neurological:  She displays normal reflexes. No cranial nerve deficit. She exhibits normal muscle tone. Coordination abnormal.  Skin: No rash noted. No erythema.  Psychiatric: She has a normal mood and affect. Her behavior is normal. Judgment and thought content normal.     Lab Results  Component Value Date   WBC 2.1 Repeated and verified X2.* 09/01/2011   HGB 13.5 09/01/2011   HCT 39.0 09/01/2011   PLT 32.0 Repeated and verified X2.* 09/01/2011   GLUCOSE 334* 09/01/2011   CHOL 159 06/13/2009   TRIG 37.0 06/13/2009   HDL 66.50 06/13/2009   LDLCALC 85 06/13/2009   ALT 37* 09/01/2011   AST 41* 09/01/2011   NA 138 09/01/2011   K 3.7 09/01/2011   CL 106 09/01/2011   CREATININE 0.9 09/01/2011   BUN 12 09/01/2011   CO2 25 09/01/2011   TSH 1.93 04/02/2011   INR 1.3* 09/01/2011   HGBA1C 8.9* 06/18/2011        Assessment & Plan:

## 2011-09-04 NOTE — Assessment & Plan Note (Signed)
IDDM. Better compliance now. Her last CBG was non-fasting Continue with current prescription therapy as reflected on the Med list.

## 2011-09-04 NOTE — Assessment & Plan Note (Signed)
Continue with current prescription therapy as reflected on the Med list. Get labs 

## 2011-09-04 NOTE — Patient Instructions (Signed)
You can get a ceramic heater with a fan

## 2011-09-06 ENCOUNTER — Encounter: Payer: Self-pay | Admitting: Internal Medicine

## 2011-09-06 NOTE — Assessment & Plan Note (Signed)
Monitoring

## 2011-09-06 NOTE — Assessment & Plan Note (Signed)
Discussed.

## 2011-09-08 ENCOUNTER — Telehealth: Payer: Self-pay | Admitting: *Deleted

## 2011-09-08 MED ORDER — FLUCONAZOLE 150 MG PO TABS: 150.0000 mg | ORAL_TABLET | Freq: Once | ORAL | Status: AC

## 2011-09-08 NOTE — Telephone Encounter (Signed)
Pt is req Rx for yeast inf. Please advise.

## 2011-09-08 NOTE — Telephone Encounter (Signed)
Diflucan done

## 2011-09-08 NOTE — Telephone Encounter (Signed)
Called the patient to inform MD will send in antibiotic, but no answer and no VM to leave message.

## 2011-09-11 NOTE — Telephone Encounter (Signed)
Attempted to reach pt, no answer, no voice mail

## 2011-09-16 NOTE — Telephone Encounter (Signed)
Per pharmacy, pt has pick up Rx

## 2011-09-18 ENCOUNTER — Other Ambulatory Visit: Payer: Self-pay | Admitting: Internal Medicine

## 2011-09-23 ENCOUNTER — Other Ambulatory Visit: Payer: Self-pay | Admitting: Internal Medicine

## 2011-10-12 ENCOUNTER — Telehealth: Payer: Self-pay | Admitting: *Deleted

## 2011-10-12 MED ORDER — PROMETHAZINE-CODEINE 6.25-10 MG/5ML PO SYRP: 5.0000 mL | ORAL_SOLUTION | Freq: Four times a day (QID) | ORAL | Status: DC | PRN

## 2011-10-12 NOTE — Telephone Encounter (Signed)
Rf req for Prometh/codeine 1-2 tsp po qid # 240 ml. Last filled 4.23.12. Ok to Rf?

## 2011-10-12 NOTE — Telephone Encounter (Signed)
OK to fill this prescription with additional refills x0 Thank you!  

## 2011-10-15 ENCOUNTER — Other Ambulatory Visit: Payer: Self-pay | Admitting: Internal Medicine

## 2011-10-16 ENCOUNTER — Telehealth: Payer: Self-pay | Admitting: Internal Medicine

## 2011-10-16 MED ORDER — TRIAMCINOLONE ACETONIDE 0.5 % EX CREA: TOPICAL_CREAM | Freq: Three times a day (TID) | CUTANEOUS | Status: DC

## 2011-10-16 MED ORDER — MAGIC MOUTHWASH: 5.0000 mL | Freq: Three times a day (TID) | ORAL | Status: DC

## 2011-10-16 NOTE — Telephone Encounter (Signed)
[name] has been sick with a fever, cough, and congestion.  She says cough medicine was called in which helps her sleep, but she still has the cough.  She has infection in her body.  Her lips are raw, her tongue is red. She can't eat because of it.   She states her genitals are raw and sore to the point she can't sit down.  [facility] [address]

## 2011-10-16 NOTE — Telephone Encounter (Signed)
It sounds more like an allergy Call in majic mouthwash 5 ml qid swish, hold swalow qid x 10 d po and triamc cream vaginally Claritin 10 mg qd ER if sick Sat clinic or ov next wk Thx

## 2011-10-16 NOTE — Telephone Encounter (Signed)
Informed patient

## 2011-10-29 ENCOUNTER — Encounter: Payer: Self-pay | Admitting: Internal Medicine

## 2011-10-29 ENCOUNTER — Ambulatory Visit (INDEPENDENT_AMBULATORY_CARE_PROVIDER_SITE_OTHER): Payer: Medicare Other | Admitting: Internal Medicine

## 2011-10-29 VITALS — BP 140/88 | HR 80 | Temp 97.5°F | Resp 16 | Wt 208.0 lb

## 2011-10-29 DIAGNOSIS — J069 Acute upper respiratory infection, unspecified: Secondary | ICD-10-CM | POA: Insufficient documentation

## 2011-10-29 DIAGNOSIS — K7460 Unspecified cirrhosis of liver: Secondary | ICD-10-CM

## 2011-10-29 DIAGNOSIS — E119 Type 2 diabetes mellitus without complications: Secondary | ICD-10-CM

## 2011-10-29 MED ORDER — CEFUROXIME AXETIL 250 MG PO TABS: 250.0000 mg | ORAL_TABLET | Freq: Two times a day (BID) | ORAL | Status: AC

## 2011-10-29 MED ORDER — BENZONATATE 100 MG PO CAPS: 100.0000 mg | ORAL_CAPSULE | Freq: Two times a day (BID) | ORAL | Status: DC | PRN

## 2011-10-29 MED ORDER — GUAIFENESIN-CODEINE 100-10 MG/5ML PO SYRP: 5.0000 mL | ORAL_SOLUTION | Freq: Three times a day (TID) | ORAL | Status: AC | PRN

## 2011-10-29 MED ORDER — MAGIC MOUTHWASH: 5.0000 mL | Freq: Three times a day (TID) | ORAL | Status: DC

## 2011-10-29 NOTE — Assessment & Plan Note (Signed)
Ceftin x 10 d Rob A-C Tessalon

## 2011-10-29 NOTE — Patient Instructions (Signed)
Use over-the-counter  "cold" medicines  such as  "Afrin" nasal spray for nasal congestion as directed instead. Use" Delsym" or" Robitussin" cough syrup varietis for cough.   Please, make an appointment if you are not better or if you're worse.

## 2011-10-29 NOTE — Assessment & Plan Note (Signed)
Labs in 1-2 wks

## 2011-10-29 NOTE — Assessment & Plan Note (Signed)
Monitor CBGs at home Continue with current prescription therapy as reflected on the Med list.

## 2011-10-29 NOTE — Progress Notes (Signed)
  Subjective:    Patient ID: [name], female    DOB: 1946-07-29, 66 y.o.   MRN: [id number]  HPI   HPI  C/o URI sx's x 5-7  Days in the setting of  DM and liver cirrohsis. C/o ST, cough, weakness. Not better with OTC medicines. Actually, the patient is getting worse. The patient did not sleep last night due to cough.  Review of Systems  Constitutional: Positive for fever, chills and fatigue.  HENT: Positive for congestion, rhinorrhea, sneezing and postnasal drip.   Eyes: Positive for photophobia and pain. Negative for discharge and visual disturbance.  Respiratory: Positive for cough and wheezing.   Positive for chest pain.  Gastrointestinal: Negative for vomiting, abdominal pain, diarrhea and abdominal distention.  Genitourinary: Negative for dysuria and difficulty urinating.  Skin: Negative for rash.  Neurological: Positive for dizziness, weakness and light-headedness.      Review of Systems  Constitutional: Positive for fever, chills, activity change and fatigue. Negative for appetite change and unexpected weight change.  HENT: Positive for congestion, mouth sores and sinus pressure.   Eyes: Negative for visual disturbance.  Respiratory: Positive for cough and chest tightness.   Gastrointestinal: Negative for nausea and abdominal pain.  Genitourinary: Negative for frequency, difficulty urinating and vaginal pain.  Musculoskeletal: Positive for arthralgias and gait problem. Negative for back pain.  Skin: Negative for pallor and rash.  Neurological: Negative for dizziness, tremors, weakness, numbness and headaches.  Psychiatric/Behavioral: Negative for suicidal ideas, confusion and sleep disturbance.       Objective:   Physical Exam  Constitutional: She appears well-developed. No distress.  HENT:  Head: Normocephalic.  Right Ear: External ear normal.  Left Ear: External ear normal.  Nose: Nose normal.  Mouth/Throat: No oropharyngeal exudate.       eryth nasal and  throat mucosa, green d/c  Eyes: Conjunctivae are normal. Pupils are equal, round, and reactive to light. Right eye exhibits no discharge. Left eye exhibits no discharge.  Neck: Normal range of motion. Neck supple. No JVD present. No tracheal deviation present. No thyromegaly present.  Cardiovascular: Normal rate, regular rhythm and normal heart sounds.   Pulmonary/Chest: No stridor. No respiratory distress. She has no wheezes. She has no rales.  Abdominal: Soft. Bowel sounds are normal. She exhibits no distension and no mass. There is no tenderness. There is no rebound and no guarding.  Musculoskeletal: She exhibits no edema and no tenderness.  Lymphadenopathy:    She has no cervical adenopathy.  Neurological: She displays normal reflexes. No cranial nerve deficit. She exhibits normal muscle tone. Coordination normal.  Skin: No rash noted. No erythema.  Psychiatric: She has a normal mood and affect. Her behavior is normal. Judgment and thought content normal.          Assessment & Plan:

## 2011-11-02 ENCOUNTER — Telehealth: Payer: Self-pay | Admitting: *Deleted

## 2011-11-02 NOTE — Telephone Encounter (Signed)
Also, Rf req for Cheratussin AC syrup 1 tsp po tid prn cough. Did pt receive written rx at 10-28-11 OV?

## 2011-11-02 NOTE — Telephone Encounter (Signed)
Not sure - she should have. OK to fill if not filled Thx

## 2011-11-02 NOTE — Telephone Encounter (Signed)
Rf req for Prometh/codeine 1-2- tsp po qid prn. Ok to Rf?

## 2011-11-03 MED ORDER — PROMETHAZINE-CODEINE 6.25-10 MG/5ML PO SYRP: 5.0000 mL | ORAL_SOLUTION | ORAL | Status: DC | PRN

## 2011-11-03 NOTE — Telephone Encounter (Signed)
I called pt- she states she did fill robitussin on 10-29-11. So, I only called in prometh/codeine syrup.

## 2011-11-03 NOTE — Telephone Encounter (Signed)
Called pt no answer °

## 2011-11-09 ENCOUNTER — Other Ambulatory Visit (INDEPENDENT_AMBULATORY_CARE_PROVIDER_SITE_OTHER): Payer: Medicare Other

## 2011-11-09 DIAGNOSIS — E119 Type 2 diabetes mellitus without complications: Secondary | ICD-10-CM

## 2011-11-09 DIAGNOSIS — K7460 Unspecified cirrhosis of liver: Secondary | ICD-10-CM

## 2011-11-09 LAB — CBC WITH DIFFERENTIAL/PLATELET
Basophils Relative: 0.1 % (ref 0.0–3.0)
Eosinophils Relative: 4.7 % (ref 0.0–5.0)
HCT: 40.5 % (ref 36.0–46.0)
MCV: 97.5 fl (ref 78.0–100.0)
Monocytes Absolute: 0.2 10*3/uL (ref 0.1–1.0)
Neutrophils Relative %: 65.8 % (ref 43.0–77.0)
RBC: 4.15 Mil/uL (ref 3.87–5.11)
WBC: 2.7 10*3/uL — ABNORMAL LOW (ref 4.5–10.5)

## 2011-11-09 LAB — HEPATIC FUNCTION PANEL
ALT: 51 U/L — ABNORMAL HIGH (ref 0–35)
AST: 62 U/L — ABNORMAL HIGH (ref 0–37)
Albumin: 2.7 g/dL — ABNORMAL LOW (ref 3.5–5.2)
Total Protein: 7 g/dL (ref 6.0–8.3)

## 2011-11-09 LAB — BASIC METABOLIC PANEL
BUN: 14 mg/dL (ref 6–23)
CO2: 29 mEq/L (ref 19–32)
GFR: 92.6 mL/min (ref >60.00)
Glucose, Bld: 322 mg/dL — ABNORMAL HIGH (ref 70–99)
Potassium: 3.7 mEq/L (ref 3.5–5.1)

## 2011-11-09 LAB — HEMOGLOBIN A1C: Hgb A1c MFr Bld: 12.2 % — ABNORMAL HIGH (ref 4.6–6.5)

## 2011-11-12 ENCOUNTER — Ambulatory Visit (INDEPENDENT_AMBULATORY_CARE_PROVIDER_SITE_OTHER): Payer: Medicare Other | Admitting: Internal Medicine

## 2011-11-12 ENCOUNTER — Encounter: Payer: Medicare Other | Admitting: Internal Medicine

## 2011-11-12 ENCOUNTER — Encounter: Payer: Self-pay | Admitting: Internal Medicine

## 2011-11-12 VITALS — BP 128/80 | HR 80 | Temp 98.3°F | Resp 16 | Wt 202.0 lb

## 2011-11-12 DIAGNOSIS — K7460 Unspecified cirrhosis of liver: Secondary | ICD-10-CM

## 2011-11-12 DIAGNOSIS — L84 Corns and callosities: Secondary | ICD-10-CM

## 2011-11-12 DIAGNOSIS — E119 Type 2 diabetes mellitus without complications: Secondary | ICD-10-CM

## 2011-11-12 DIAGNOSIS — B3781 Candidal esophagitis: Secondary | ICD-10-CM

## 2011-11-12 DIAGNOSIS — R197 Diarrhea, unspecified: Secondary | ICD-10-CM | POA: Insufficient documentation

## 2011-11-12 DIAGNOSIS — B370 Candidal stomatitis: Secondary | ICD-10-CM | POA: Insufficient documentation

## 2011-11-12 MED ORDER — GABAPENTIN 400 MG PO CAPS: 400.0000 mg | ORAL_CAPSULE | Freq: Four times a day (QID) | ORAL | Status: DC

## 2011-11-12 MED ORDER — NYSTATIN 100000 UNIT/ML MT SUSP: 500000.0000 [IU] | Freq: Four times a day (QID) | OROMUCOSAL | Status: DC

## 2011-11-12 MED ORDER — PROMETHAZINE-CODEINE 6.25-10 MG/5ML PO SYRP: 5.0000 mL | ORAL_SOLUTION | ORAL | Status: AC | PRN

## 2011-11-12 MED ORDER — BENZONATATE 100 MG PO CAPS: 100.0000 mg | ORAL_CAPSULE | Freq: Two times a day (BID) | ORAL | Status: DC | PRN

## 2011-11-12 MED ORDER — COLESEVELAM HCL 3.75 G PO PACK: PACK | ORAL | Status: DC

## 2011-11-12 NOTE — Assessment & Plan Note (Addendum)
Do better with current insulin SS

## 2011-11-12 NOTE — Assessment & Plan Note (Signed)
Labs reviewed.

## 2011-11-12 NOTE — Assessment & Plan Note (Signed)
welchol prn 

## 2011-11-12 NOTE — Progress Notes (Signed)
Patient ID: Tara Rud, female   DOB: April 17, 1946, 66 y.o.   MRN: [id number]  Subjective:    Patient ID: Lynise Porr, female    DOB: [birth year], 66 y.o.   MRN: [id number]  Sore Throat  Pertinent negatives include no abdominal pain, congestion, coughing or headaches.    The patient presents for a follow-up of  Chronic cirrhosis, hypertension, chronic dyslipidemia, type 2 diabetes controlled with medicines. C/o loose stools and ST, problems swallowing. Not well since Dec 2012    Review of Systems  Constitutional: Positive for fatigue. Negative for chills, activity change, appetite change and unexpected weight change.  HENT: Negative for congestion, mouth sores and sinus pressure.   Eyes: Negative for visual disturbance.  Respiratory: Negative for cough and chest tightness.   Gastrointestinal: Negative for nausea and abdominal pain.  Genitourinary: Negative for frequency, difficulty urinating and vaginal pain.  Musculoskeletal: Positive for arthralgias and gait problem. Negative for back pain.  Skin: Negative for pallor and rash.  Neurological: Negative for dizziness, tremors, weakness, numbness and headaches.  Psychiatric/Behavioral: Negative for confusion and sleep disturbance.       Objective:   Physical Exam  Constitutional: She appears well-developed. No distress.       obese  HENT:  Head: Normocephalic.  Right Ear: External ear normal.  Left Ear: External ear normal.  Nose: Nose normal.  Mouth/Throat: Oropharynx is clear and moist.  Eyes: Conjunctivae are normal. Pupils are equal, round, and reactive to light. Right eye exhibits no discharge. Left eye exhibits no discharge.  Neck: Normal range of motion. Neck supple. No JVD present. No tracheal deviation present. No thyromegaly present.  Cardiovascular: Normal rate, regular rhythm and normal heart sounds.   Pulmonary/Chest: No stridor. No respiratory distress. She has no wheezes.  Abdominal: Soft. Bowel sounds are normal. She  exhibits no distension and no mass. There is no tenderness. There is no rebound and no guarding.  Musculoskeletal: She exhibits edema (trace) and tenderness (B knees).  Lymphadenopathy:    She has no cervical adenopathy.  Neurological: She displays normal reflexes. No cranial nerve deficit. She exhibits normal muscle tone. Coordination abnormal.  Skin: No rash noted. No erythema.  Psychiatric: She has a normal mood and affect. Her behavior is normal. Judgment and thought content normal.  B calluces lat dist feet R>>L   Lab Results  Component Value Date   WBC 2.7* 11/09/2011   HGB 13.9 11/09/2011   HCT 40.5 11/09/2011   PLT 33.0* 11/09/2011   GLUCOSE 322* 11/09/2011   CHOL 159 06/13/2009   TRIG 37.0 06/13/2009   HDL 66.50 06/13/2009   LDLCALC 85 06/13/2009   ALT 51* 11/09/2011   AST 62* 11/09/2011   NA 137 11/09/2011   K 3.7 11/09/2011   CL 102 11/09/2011   CREATININE 0.7 11/09/2011   BUN 14 11/09/2011   CO2 29 11/09/2011   TSH 1.93 04/02/2011   INR 1.3* 09/01/2011   HGBA1C 12.2* 11/09/2011   Procedure:   foot callus paring/cutting  Indication: Rlat   foot callus, painful  Consent: verbal  Risks and benefits were explained to the patient. Skin was cleaned with alcohol. I removed a large callus carefully with a round blade. Skin remained intact. Pain is better.  Tolerated well. Complications: none. Bandaid applied      Assessment & Plan:

## 2011-11-12 NOTE — Assessment & Plan Note (Signed)
Nystatin x 10 d

## 2011-11-12 NOTE — Assessment & Plan Note (Signed)
R debrided

## 2011-11-18 ENCOUNTER — Telehealth: Payer: Self-pay | Admitting: Internal Medicine

## 2011-11-18 MED ORDER — NYSTATIN 100000 UNIT/ML MT SUSP: 500000.0000 [IU] | Freq: Four times a day (QID) | OROMUCOSAL | Status: AC

## 2011-11-18 NOTE — Telephone Encounter (Signed)
I re-ordered Nystatin Thx

## 2011-11-18 NOTE — Telephone Encounter (Signed)
[name] THROAT IS STILL SWOLLEN AND SORE.  SHE IS REQUESTING MORE ANTIBIOTICS.

## 2011-11-27 ENCOUNTER — Telehealth: Payer: Self-pay | Admitting: Internal Medicine

## 2011-11-27 MED ORDER — CLOTRIMAZOLE-BETAMETHASONE 1-0.05 % EX CREA: TOPICAL_CREAM | Freq: Two times a day (BID) | CUTANEOUS | Status: AC

## 2011-11-27 NOTE — Telephone Encounter (Signed)
[name] called wanting something to replace the Triamcinolone Acetonide Cream.  It is not helping and her vagina is still raw.  She has trouble sitting and even laying on the bed.  She is still coughing also.

## 2011-11-27 NOTE — Telephone Encounter (Signed)
Try Lotrisone Cont Cough syr OV if not better Thx

## 2011-11-30 NOTE — Telephone Encounter (Signed)
Pt is aware to go to the pharmacy for the Lotrisone.  She is requesting a refill on cough medicine and magic mouthwash.

## 2011-12-01 NOTE — Telephone Encounter (Signed)
Ok both Thx 

## 2011-12-02 MED ORDER — MAGIC MOUTHWASH: 5.0000 mL | Freq: Three times a day (TID) | ORAL | Status: DC

## 2011-12-02 MED ORDER — PROMETHAZINE-CODEINE 6.25-10 MG/5ML PO SYRP: 5.0000 mL | ORAL_SOLUTION | Freq: Four times a day (QID) | ORAL | Status: AC | PRN

## 2011-12-02 NOTE — Telephone Encounter (Signed)
Rf sent. Tried to call pt- no answer

## 2011-12-07 ENCOUNTER — Other Ambulatory Visit: Payer: Self-pay | Admitting: Internal Medicine

## 2011-12-13 ENCOUNTER — Other Ambulatory Visit: Payer: Self-pay | Admitting: Internal Medicine

## 2011-12-14 ENCOUNTER — Other Ambulatory Visit: Payer: Self-pay | Admitting: Internal Medicine

## 2011-12-17 ENCOUNTER — Telehealth: Payer: Self-pay | Admitting: *Deleted

## 2011-12-17 ENCOUNTER — Other Ambulatory Visit (INDEPENDENT_AMBULATORY_CARE_PROVIDER_SITE_OTHER): Payer: BC Managed Care – PPO

## 2011-12-17 DIAGNOSIS — B370 Candidal stomatitis: Secondary | ICD-10-CM

## 2011-12-17 DIAGNOSIS — E119 Type 2 diabetes mellitus without complications: Secondary | ICD-10-CM

## 2011-12-17 DIAGNOSIS — B3781 Candidal esophagitis: Secondary | ICD-10-CM

## 2011-12-17 DIAGNOSIS — R197 Diarrhea, unspecified: Secondary | ICD-10-CM

## 2011-12-17 DIAGNOSIS — K7460 Unspecified cirrhosis of liver: Secondary | ICD-10-CM

## 2011-12-17 LAB — BASIC METABOLIC PANEL
CO2: 27 mEq/L (ref 19–32)
Calcium: 9.5 mg/dL (ref 8.4–10.5)
Creatinine, Ser: 0.8 mg/dL (ref 0.4–1.2)
GFR: 82.67 mL/min (ref >60.00)
Sodium: 133 mEq/L — ABNORMAL LOW (ref 135–145)

## 2011-12-17 LAB — CBC WITH DIFFERENTIAL/PLATELET
Basophils Absolute: 0 10*3/uL (ref 0.0–0.1)
Eosinophils Absolute: 0.2 10*3/uL (ref 0.0–0.7)
HCT: 39.3 % (ref 36.0–46.0)
Lymphs Abs: 0.8 10*3/uL (ref 0.7–4.0)
MCHC: 33.7 g/dL (ref 30.0–36.0)
Monocytes Absolute: 0.2 10*3/uL (ref 0.1–1.0)
Monocytes Relative: 7.4 % (ref 3.0–12.0)
Platelets: 36 10*3/uL — CL (ref 150.0–400.0)
RDW: 16.3 % — ABNORMAL HIGH (ref 11.5–14.6)

## 2011-12-17 LAB — HEPATIC FUNCTION PANEL
Alkaline Phosphatase: 122 U/L — ABNORMAL HIGH (ref 39–117)
Bilirubin, Direct: 0.6 mg/dL — ABNORMAL HIGH (ref 0.0–0.3)
Total Bilirubin: 1.7 mg/dL — ABNORMAL HIGH (ref 0.3–1.2)

## 2011-12-17 LAB — PROTIME-INR: INR: 1.2 ratio — ABNORMAL HIGH (ref 0.8–1.0)

## 2011-12-17 LAB — AMMONIA: Ammonia: 38 umol/L — ABNORMAL HIGH (ref 11–35)

## 2011-12-17 NOTE — Telephone Encounter (Signed)
[facility] called to report Critical: Platelets 33.0

## 2011-12-18 NOTE — Telephone Encounter (Signed)
Noted. Thx.

## 2011-12-22 ENCOUNTER — Ambulatory Visit (INDEPENDENT_AMBULATORY_CARE_PROVIDER_SITE_OTHER): Payer: Medicare Other | Admitting: Internal Medicine

## 2011-12-22 ENCOUNTER — Encounter: Payer: Self-pay | Admitting: Internal Medicine

## 2011-12-22 VITALS — BP 120/76 | HR 76 | Temp 98.4°F | Resp 16 | Wt 198.0 lb

## 2011-12-22 DIAGNOSIS — M545 Low back pain, unspecified: Secondary | ICD-10-CM

## 2011-12-22 DIAGNOSIS — E119 Type 2 diabetes mellitus without complications: Secondary | ICD-10-CM

## 2011-12-22 DIAGNOSIS — F3289 Other specified depressive episodes: Secondary | ICD-10-CM

## 2011-12-22 DIAGNOSIS — K7460 Unspecified cirrhosis of liver: Secondary | ICD-10-CM

## 2011-12-22 DIAGNOSIS — F329 Major depressive disorder, single episode, unspecified: Secondary | ICD-10-CM

## 2011-12-22 DIAGNOSIS — D696 Thrombocytopenia, unspecified: Secondary | ICD-10-CM

## 2011-12-22 DIAGNOSIS — R51 Headache: Secondary | ICD-10-CM

## 2011-12-22 NOTE — Assessment & Plan Note (Signed)
Continue with current prescription therapy as reflected on the Med list.  

## 2011-12-22 NOTE — Assessment & Plan Note (Signed)
now post-fall 2 wks ago 3/13 - getting better. She refused CT Discussed

## 2011-12-22 NOTE — Progress Notes (Signed)
Patient ID: [name], female   DOB: 03/05/1946, 66 y.o.   MRN: [id number] Patient ID: [name], female   DOB: December 21, 1945, 66 y.o.   MRN: [id number]  Subjective:    Patient ID: [name], female    DOB: Jul 07, 1946, 66 y.o.   MRN: [id number]  Fall Associated symptoms include headaches. Pertinent negatives include no abdominal pain, nausea or numbness.  Headache  Pertinent negatives include no abdominal pain, back pain, coughing, dizziness, nausea, numbness, sinus pressure or weakness.  Sore Throat  Associated symptoms include headaches. Pertinent negatives include no abdominal pain, congestion or coughing.    The patient presents for a follow-up of  Chronic cirrhosis, hypertension, chronic dyslipidemia, type 2 diabetes controlled with medicines. C/o loose stools and ST, problems swallowing. Not well since Dec 2012. Fell 2 wks ago, hit her head. C/o HAs  Wt Readings from Last 3 Encounters:  12/22/11 198 lb (89.812 kg)  11/12/11 202 lb (91.627 kg)  10/29/11 208 lb (94.348 kg)   BP Readings from Last 3 Encounters:  12/22/11 120/76  11/12/11 128/80  10/29/11 140/88        Review of Systems  Constitutional: Positive for fatigue. Negative for chills, activity change, appetite change and unexpected weight change.  HENT: Negative for congestion, mouth sores and sinus pressure.   Eyes: Negative for visual disturbance.  Respiratory: Negative for cough and chest tightness.   Gastrointestinal: Negative for nausea and abdominal pain.  Genitourinary: Negative for frequency, difficulty urinating and vaginal pain.  Musculoskeletal: Positive for arthralgias and gait problem. Negative for back pain.  Skin: Negative for pallor and rash.  Neurological: Positive for headaches. Negative for dizziness, tremors, weakness and numbness.  Psychiatric/Behavioral: Negative for confusion and sleep disturbance.       Objective:   Physical Exam  Constitutional: She appears well-developed. No distress.         obese  HENT:  Head: Normocephalic.  Right Ear: External ear normal.  Left Ear: External ear normal.  Nose: Nose normal.  Mouth/Throat: Oropharynx is clear and moist.  Eyes: Conjunctivae are normal. Pupils are equal, round, and reactive to light. Right eye exhibits no discharge. Left eye exhibits no discharge.  Neck: Normal range of motion. Neck supple. No JVD present. No tracheal deviation present. No thyromegaly present.  Cardiovascular: Normal rate, regular rhythm and normal heart sounds.   Pulmonary/Chest: No stridor. No respiratory distress. She has no wheezes.  Abdominal: Soft. Bowel sounds are normal. She exhibits no distension and no mass. There is no tenderness. There is no rebound and no guarding.  Musculoskeletal: She exhibits edema (trace) and tenderness (B knees).  Lymphadenopathy:    She has no cervical adenopathy.  Neurological: She displays normal reflexes. No cranial nerve deficit. She exhibits normal muscle tone. Coordination abnormal.  Skin: No rash noted. No erythema.  Psychiatric: She has a normal mood and affect. Her behavior is normal. Judgment and thought content normal.  B calluces lat dist feet R>>L   Lab Results  Component Value Date   WBC 3.2* 12/17/2011   HGB 13.2 12/17/2011   HCT 39.3 12/17/2011   PLT 36.0 Repeated and verified X2.* 12/17/2011   GLUCOSE 353* 12/17/2011   CHOL 159 06/13/2009   TRIG 37.0 06/13/2009   HDL 66.50 06/13/2009   LDLCALC 85 06/13/2009   ALT 61* 12/17/2011   AST 64* 12/17/2011   NA 133* 12/17/2011   K 3.6 12/17/2011   CL 101 12/17/2011   CREATININE 0.8 12/17/2011   BUN 14 12/17/2011  CO2 27 12/17/2011   TSH 1.93 04/02/2011   INR 1.2* 12/17/2011   HGBA1C 12.2* 11/09/2011        Assessment & Plan:

## 2011-12-22 NOTE — Assessment & Plan Note (Signed)
Monitoring

## 2012-01-05 ENCOUNTER — Other Ambulatory Visit: Payer: Self-pay | Admitting: Internal Medicine

## 2012-01-05 NOTE — Telephone Encounter (Signed)
[name] is requesting Free Style Test Strips to be sent to [facility] on [address].

## 2012-02-22 ENCOUNTER — Other Ambulatory Visit (INDEPENDENT_AMBULATORY_CARE_PROVIDER_SITE_OTHER): Payer: Medicare Other

## 2012-02-22 DIAGNOSIS — M545 Low back pain, unspecified: Secondary | ICD-10-CM

## 2012-02-22 DIAGNOSIS — K7460 Unspecified cirrhosis of liver: Secondary | ICD-10-CM

## 2012-02-22 DIAGNOSIS — F329 Major depressive disorder, single episode, unspecified: Secondary | ICD-10-CM

## 2012-02-22 DIAGNOSIS — F3289 Other specified depressive episodes: Secondary | ICD-10-CM

## 2012-02-22 DIAGNOSIS — R51 Headache: Secondary | ICD-10-CM

## 2012-02-22 DIAGNOSIS — D696 Thrombocytopenia, unspecified: Secondary | ICD-10-CM

## 2012-02-22 DIAGNOSIS — E119 Type 2 diabetes mellitus without complications: Secondary | ICD-10-CM

## 2012-02-22 DIAGNOSIS — IMO0001 Reserved for inherently not codable concepts without codable children: Secondary | ICD-10-CM

## 2012-02-22 LAB — HEPATIC FUNCTION PANEL
Albumin: 2.6 g/dL — ABNORMAL LOW (ref 3.5–5.2)
Alkaline Phosphatase: 92 U/L (ref 39–117)
Total Protein: 6.4 g/dL (ref 6.0–8.3)

## 2012-02-22 LAB — BASIC METABOLIC PANEL
BUN: 12 mg/dL (ref 6–23)
CO2: 28 mEq/L (ref 19–32)
Calcium: 9.5 mg/dL (ref 8.4–10.5)
Glucose, Bld: 439 mg/dL — ABNORMAL HIGH (ref 70–99)
Sodium: 135 mEq/L (ref 135–145)

## 2012-02-22 LAB — CBC WITH DIFFERENTIAL/PLATELET
Basophils Absolute: 0 10*3/uL (ref 0.0–0.1)
Hemoglobin: 13.5 g/dL (ref 12.0–15.0)
Lymphocytes Relative: 25.8 % (ref 12.0–46.0)
Monocytes Relative: 9.4 % (ref 3.0–12.0)
Platelets: 27 10*3/uL — CL (ref 150.0–400.0)
RDW: 16.3 % — ABNORMAL HIGH (ref 11.5–14.6)
WBC: 2.2 10*3/uL — ABNORMAL LOW (ref 4.5–10.5)

## 2012-02-22 LAB — HEMOGLOBIN A1C: Hgb A1c MFr Bld: 11 % — ABNORMAL HIGH (ref 4.6–6.5)

## 2012-02-25 ENCOUNTER — Encounter: Payer: Self-pay | Admitting: Internal Medicine

## 2012-02-25 ENCOUNTER — Ambulatory Visit (INDEPENDENT_AMBULATORY_CARE_PROVIDER_SITE_OTHER): Payer: Medicare Other | Admitting: Internal Medicine

## 2012-02-25 VITALS — BP 130/80 | HR 101 | Temp 98.5°F | Resp 16 | Wt 195.0 lb

## 2012-02-25 DIAGNOSIS — I10 Essential (primary) hypertension: Secondary | ICD-10-CM

## 2012-02-25 DIAGNOSIS — D696 Thrombocytopenia, unspecified: Secondary | ICD-10-CM

## 2012-02-25 DIAGNOSIS — M797 Fibromyalgia: Secondary | ICD-10-CM

## 2012-02-25 DIAGNOSIS — IMO0001 Reserved for inherently not codable concepts without codable children: Secondary | ICD-10-CM

## 2012-02-25 DIAGNOSIS — R197 Diarrhea, unspecified: Secondary | ICD-10-CM

## 2012-02-25 DIAGNOSIS — D509 Iron deficiency anemia, unspecified: Secondary | ICD-10-CM

## 2012-02-25 DIAGNOSIS — R209 Unspecified disturbances of skin sensation: Secondary | ICD-10-CM

## 2012-02-25 DIAGNOSIS — K7460 Unspecified cirrhosis of liver: Secondary | ICD-10-CM

## 2012-02-25 DIAGNOSIS — E119 Type 2 diabetes mellitus without complications: Secondary | ICD-10-CM

## 2012-02-25 DIAGNOSIS — F3289 Other specified depressive episodes: Secondary | ICD-10-CM

## 2012-02-25 DIAGNOSIS — R202 Paresthesia of skin: Secondary | ICD-10-CM

## 2012-02-25 DIAGNOSIS — F329 Major depressive disorder, single episode, unspecified: Secondary | ICD-10-CM

## 2012-02-25 NOTE — Progress Notes (Signed)
Patient ID: [name], female   DOB: 06/25/1946, 66 y.o.   MRN: [id number] Patient ID: [name], female   DOB: 1946-10-04, 66 y.o.   MRN: [id number] Patient ID: [name], female   DOB: Feb 09, 1946, 66 y.o.   MRN: [id number]  Subjective:    Patient ID: [name], female    DOB: 09/14/1946, 66 y.o.   MRN: [id number]  HPI  The patient presents for a follow-up of  Chronic cirrhosis, hypertension, chronic dyslipidemia, type 2 diabetes controlled with medicines. C/o loose stools, no problems swallowing.2012.C/o HAs. C/o L shoulder pain  Wt Readings from Last 3 Encounters:  02/25/12 195 lb (88.451 kg)  12/22/11 198 lb (89.812 kg)  11/12/11 202 lb (91.627 kg)   BP Readings from Last 3 Encounters:  02/25/12 130/80  12/22/11 120/76  11/12/11 128/80        Review of Systems  Constitutional: Positive for fatigue. Negative for chills, activity change, appetite change and unexpected weight change.  HENT: Negative for mouth sores.   Eyes: Negative for visual disturbance.  Respiratory: Negative for chest tightness.   Genitourinary: Negative for frequency, difficulty urinating and vaginal pain.  Musculoskeletal: Positive for back pain, arthralgias and gait problem.  Skin: Negative for pallor and rash.  Neurological: Positive for dizziness, weakness and light-headedness. Negative for tremors.  Hematological: Negative for adenopathy. Bruises/bleeds easily.  Psychiatric/Behavioral: Positive for decreased concentration. Negative for suicidal ideas, confusion and sleep disturbance. The patient is nervous/anxious.        Objective:   Physical Exam  Constitutional: She appears well-developed. No distress.       obese  HENT:  Head: Normocephalic.  Right Ear: External ear normal.  Left Ear: External ear normal.  Nose: Nose normal.  Mouth/Throat: Oropharynx is clear and moist.  Eyes: Conjunctivae are normal. Pupils are equal, round, and reactive to light. Right eye exhibits no discharge. Left  eye exhibits no discharge.  Neck: Normal range of motion. Neck supple. No JVD present. No tracheal deviation present. No thyromegaly present.  Cardiovascular: Normal rate, regular rhythm and normal heart sounds.   Pulmonary/Chest: No stridor. No respiratory distress. She has no wheezes.  Abdominal: Soft. Bowel sounds are normal. She exhibits no distension and no mass. There is no tenderness. There is no rebound and no guarding.  Musculoskeletal: She exhibits edema (trace) and tenderness (B knees).  Lymphadenopathy:    She has no cervical adenopathy.  Neurological: She displays normal reflexes. No cranial nerve deficit. She exhibits normal muscle tone. Coordination abnormal.  Skin: No rash noted. No erythema. No pallor.  Psychiatric: She has a normal mood and affect. Her behavior is normal. Judgment and thought content normal.  B calluces lat dist feet R>>L   Lab Results  Component Value Date   WBC 2.2 Repeated and verified X2.* 02/22/2012   HGB 13.5 02/22/2012   HCT 39.5 02/22/2012   PLT 27.0 Repeated and verified X2.* 02/22/2012   GLUCOSE 439* 02/22/2012   CHOL 159 06/13/2009   TRIG 37.0 06/13/2009   HDL 66.50 06/13/2009   LDLCALC 85 06/13/2009   ALT 50* 02/22/2012   AST 56* 02/22/2012   NA 135 02/22/2012   K 4.0 02/22/2012   CL 102 02/22/2012   CREATININE 0.7 02/22/2012   BUN 12 02/22/2012   CO2 28 02/22/2012   TSH 1.93 04/02/2011   INR 1.2* 02/22/2012   HGBA1C 11.0* 02/22/2012        Assessment & Plan:

## 2012-02-25 NOTE — Assessment & Plan Note (Signed)
Labs reviewed.

## 2012-02-25 NOTE — Assessment & Plan Note (Signed)
Continue with current prescription therapy as reflected on the Med list.  

## 2012-02-25 NOTE — Assessment & Plan Note (Signed)
Worse lately To ER if bleeding/bruising bad Repeat labs in 2 mo

## 2012-02-25 NOTE — Assessment & Plan Note (Signed)
Continue with current prescription therapy as reflected on the Med list. Align samples given

## 2012-02-25 NOTE — Assessment & Plan Note (Signed)
Monitoring

## 2012-02-26 ENCOUNTER — Encounter: Payer: Self-pay | Admitting: Internal Medicine

## 2012-04-26 ENCOUNTER — Other Ambulatory Visit (INDEPENDENT_AMBULATORY_CARE_PROVIDER_SITE_OTHER): Payer: Medicare Other

## 2012-04-26 DIAGNOSIS — D509 Iron deficiency anemia, unspecified: Secondary | ICD-10-CM

## 2012-04-26 DIAGNOSIS — R202 Paresthesia of skin: Secondary | ICD-10-CM

## 2012-04-26 DIAGNOSIS — R209 Unspecified disturbances of skin sensation: Secondary | ICD-10-CM

## 2012-04-26 DIAGNOSIS — F329 Major depressive disorder, single episode, unspecified: Secondary | ICD-10-CM

## 2012-04-26 DIAGNOSIS — IMO0001 Reserved for inherently not codable concepts without codable children: Secondary | ICD-10-CM

## 2012-04-26 DIAGNOSIS — E119 Type 2 diabetes mellitus without complications: Secondary | ICD-10-CM

## 2012-04-26 DIAGNOSIS — R197 Diarrhea, unspecified: Secondary | ICD-10-CM

## 2012-04-26 DIAGNOSIS — M797 Fibromyalgia: Secondary | ICD-10-CM

## 2012-04-26 DIAGNOSIS — D696 Thrombocytopenia, unspecified: Secondary | ICD-10-CM

## 2012-04-26 DIAGNOSIS — K7460 Unspecified cirrhosis of liver: Secondary | ICD-10-CM

## 2012-04-26 DIAGNOSIS — I10 Essential (primary) hypertension: Secondary | ICD-10-CM

## 2012-04-26 LAB — CBC WITH DIFFERENTIAL/PLATELET
Eosinophils Absolute: 0.1 10*3/uL (ref 0.0–0.7)
MCHC: 33.9 g/dL (ref 30.0–36.0)
MCV: 95.9 fl (ref 78.0–100.0)
Monocytes Absolute: 0.2 10*3/uL (ref 0.1–1.0)
Neutrophils Relative %: 56.3 % (ref 43.0–77.0)
Platelets: 32 10*3/uL — CL (ref 150.0–400.0)
RDW: 16.9 % — ABNORMAL HIGH (ref 11.5–14.6)

## 2012-04-26 LAB — BASIC METABOLIC PANEL
BUN: 16 mg/dL (ref 6–23)
GFR: 83.87 mL/min (ref >60.00)
Potassium: 4.1 mEq/L (ref 3.5–5.1)
Sodium: 136 mEq/L (ref 135–145)

## 2012-04-26 LAB — HEPATIC FUNCTION PANEL
AST: 48 U/L — ABNORMAL HIGH (ref 0–37)
Total Bilirubin: 1.5 mg/dL — ABNORMAL HIGH (ref 0.3–1.2)

## 2012-04-26 LAB — AMMONIA: Ammonia: 37 umol/L — ABNORMAL HIGH (ref 11–35)

## 2012-04-26 LAB — PROTIME-INR: Prothrombin Time: 13.2 s — ABNORMAL HIGH (ref 10.2–12.4)

## 2012-04-28 ENCOUNTER — Encounter: Payer: Self-pay | Admitting: Internal Medicine

## 2012-04-28 ENCOUNTER — Ambulatory Visit (INDEPENDENT_AMBULATORY_CARE_PROVIDER_SITE_OTHER): Payer: Medicare Other | Admitting: Internal Medicine

## 2012-04-28 VITALS — BP 130/70 | HR 72 | Temp 98.7°F | Resp 16 | Wt 193.0 lb

## 2012-04-28 DIAGNOSIS — I10 Essential (primary) hypertension: Secondary | ICD-10-CM

## 2012-04-28 DIAGNOSIS — D696 Thrombocytopenia, unspecified: Secondary | ICD-10-CM

## 2012-04-28 DIAGNOSIS — F329 Major depressive disorder, single episode, unspecified: Secondary | ICD-10-CM

## 2012-04-28 DIAGNOSIS — R7309 Other abnormal glucose: Secondary | ICD-10-CM

## 2012-04-28 DIAGNOSIS — E119 Type 2 diabetes mellitus without complications: Secondary | ICD-10-CM

## 2012-04-28 DIAGNOSIS — F3289 Other specified depressive episodes: Secondary | ICD-10-CM

## 2012-04-28 LAB — GLUCOSE, POCT (MANUAL RESULT ENTRY): POC Glucose: 417 mg/dl — AB (ref 70–99)

## 2012-04-28 MED ORDER — TRAMADOL HCL 50 MG PO TABS: 50.0000 mg | ORAL_TABLET | Freq: Three times a day (TID) | ORAL | Status: DC | PRN

## 2012-04-28 NOTE — Assessment & Plan Note (Signed)
Recheck in 2 mo

## 2012-04-28 NOTE — Assessment & Plan Note (Signed)
Continue with current prescription therapy as reflected on the Med list.  

## 2012-04-28 NOTE — Progress Notes (Signed)
   Subjective:    Patient ID: [name], female    DOB: 1946-07-25, 66 y.o.   MRN: [id number]  HPI  The patient presents for a follow-up of  Chronic cirrhosis, hypertension, chronic dyslipidemia, type 2 diabetes controlled with medicines. C/o loose stools, no problems swallowing.2012. C/o HAs. C/o L shoulder pain - better  Wt Readings from Last 3 Encounters:  04/28/12 193 lb (87.544 kg)  02/25/12 195 lb (88.451 kg)  12/22/11 198 lb (89.812 kg)   BP Readings from Last 3 Encounters:  04/28/12 130/70  02/25/12 130/80  12/22/11 120/76        Review of Systems  Constitutional: Positive for fatigue. Negative for chills, activity change, appetite change and unexpected weight change.  HENT: Negative for mouth sores.   Eyes: Negative for visual disturbance.  Respiratory: Negative for chest tightness.   Genitourinary: Negative for frequency, difficulty urinating and vaginal pain.  Musculoskeletal: Positive for back pain, arthralgias and gait problem.  Skin: Negative for pallor and rash.  Neurological: Positive for dizziness, weakness and light-headedness. Negative for tremors.  Hematological: Negative for adenopathy. Bruises/bleeds easily.  Psychiatric/Behavioral: Positive for decreased concentration. Negative for suicidal ideas, confusion and disturbed wake/sleep cycle. The patient is nervous/anxious.        Objective:   Physical Exam  Constitutional: She appears well-developed. No distress.       obese  HENT:  Head: Normocephalic.  Right Ear: External ear normal.  Left Ear: External ear normal.  Nose: Nose normal.  Mouth/Throat: Oropharynx is clear and moist.  Eyes: Conjunctivae are normal. Pupils are equal, round, and reactive to light. Right eye exhibits no discharge. Left eye exhibits no discharge.  Neck: Normal range of motion. Neck supple. No JVD present. No tracheal deviation present. No thyromegaly present.  Cardiovascular: Normal rate, regular rhythm and normal heart  sounds.   Pulmonary/Chest: No stridor. No respiratory distress. She has no wheezes.  Abdominal: Soft. Bowel sounds are normal. She exhibits no distension and no mass. There is no tenderness. There is no rebound and no guarding.  Musculoskeletal: She exhibits edema (trace) and tenderness (B knees).  Lymphadenopathy:    She has no cervical adenopathy.  Neurological: She displays normal reflexes. No cranial nerve deficit. She exhibits normal muscle tone. Coordination abnormal.  Skin: No rash noted. No erythema. No pallor.  Psychiatric: She has a normal mood and affect. Her behavior is normal. Judgment and thought content normal.  B calluces lat dist feet R>>L   Lab Results  Component Value Date   WBC 2.0 Repeated and verified X2.* 04/26/2012   HGB 12.8 04/26/2012   HCT 37.9 04/26/2012   PLT 32.0 Repeated and verified X2.* 04/26/2012   GLUCOSE 374* 04/26/2012   CHOL 159 06/13/2009   TRIG 37.0 06/13/2009   HDL 66.50 06/13/2009   LDLCALC 85 06/13/2009   ALT 42* 04/26/2012   AST 48* 04/26/2012   NA 136 04/26/2012   K 4.1 04/26/2012   CL 101 04/26/2012   CREATININE 0.7 04/26/2012   BUN 16 04/26/2012   CO2 28 04/26/2012   TSH 1.93 04/02/2011   INR 1.2* 04/26/2012   HGBA1C 11.0* 02/22/2012        Assessment & Plan:

## 2012-04-28 NOTE — Assessment & Plan Note (Signed)
Start Humalog 10-12 u at 7-8 am (now her 1st dose is at noon) and per [facility]

## 2012-04-28 NOTE — Assessment & Plan Note (Signed)
As above.

## 2012-04-28 NOTE — Patient Instructions (Addendum)
Start Humalog 10-12 units at 7-8 am

## 2012-05-02 ENCOUNTER — Telehealth: Payer: Self-pay | Admitting: Internal Medicine

## 2012-05-02 NOTE — Telephone Encounter (Signed)
FYI:  Cami Delawder called to let us know that her sister wants to talk to someone about her condition.  Her sister's name is Stacie Acres.  Athene says it is OK to talk to her.  I am mailing her [employer] Releases to fill out also.

## 2012-05-04 ENCOUNTER — Other Ambulatory Visit: Payer: Self-pay | Admitting: Internal Medicine

## 2012-06-02 ENCOUNTER — Other Ambulatory Visit: Payer: Self-pay | Admitting: Internal Medicine

## 2012-06-16 ENCOUNTER — Other Ambulatory Visit: Payer: Self-pay | Admitting: Internal Medicine

## 2012-06-28 ENCOUNTER — Other Ambulatory Visit (INDEPENDENT_AMBULATORY_CARE_PROVIDER_SITE_OTHER): Payer: Medicare Other

## 2012-06-28 ENCOUNTER — Other Ambulatory Visit: Payer: Self-pay

## 2012-06-28 ENCOUNTER — Ambulatory Visit (INDEPENDENT_AMBULATORY_CARE_PROVIDER_SITE_OTHER): Payer: Medicare Other | Admitting: Internal Medicine

## 2012-06-28 ENCOUNTER — Telehealth: Payer: Self-pay

## 2012-06-28 ENCOUNTER — Encounter: Payer: Self-pay | Admitting: Internal Medicine

## 2012-06-28 VITALS — BP 120/70 | HR 72 | Temp 97.4°F | Resp 16 | Wt 191.0 lb

## 2012-06-28 DIAGNOSIS — K5010 Crohn's disease of large intestine without complications: Secondary | ICD-10-CM

## 2012-06-28 DIAGNOSIS — D696 Thrombocytopenia, unspecified: Secondary | ICD-10-CM

## 2012-06-28 DIAGNOSIS — K7460 Unspecified cirrhosis of liver: Secondary | ICD-10-CM

## 2012-06-28 DIAGNOSIS — M545 Low back pain, unspecified: Secondary | ICD-10-CM

## 2012-06-28 DIAGNOSIS — Z23 Encounter for immunization: Secondary | ICD-10-CM

## 2012-06-28 DIAGNOSIS — I10 Essential (primary) hypertension: Secondary | ICD-10-CM

## 2012-06-28 DIAGNOSIS — E119 Type 2 diabetes mellitus without complications: Secondary | ICD-10-CM

## 2012-06-28 LAB — CBC WITH DIFFERENTIAL/PLATELET
Basophils Absolute: 0 10*3/uL (ref 0.0–0.1)
HCT: 41.3 % (ref 36.0–46.0)
Hemoglobin: 13.9 g/dL (ref 12.0–15.0)
Lymphs Abs: 0.7 10*3/uL (ref 0.7–4.0)
Monocytes Relative: 8.4 % (ref 3.0–12.0)
Neutro Abs: 1.6 10*3/uL (ref 1.4–7.7)
RDW: 16.6 % — ABNORMAL HIGH (ref 11.5–14.6)

## 2012-06-28 LAB — HEMOGLOBIN A1C: Hgb A1c MFr Bld: 10.6 % — ABNORMAL HIGH (ref 4.6–6.5)

## 2012-06-28 LAB — HEPATIC FUNCTION PANEL
ALT: 44 U/L — ABNORMAL HIGH (ref 0–35)
AST: 60 U/L — ABNORMAL HIGH (ref 0–37)
Alkaline Phosphatase: 96 U/L (ref 39–117)
Bilirubin, Direct: 0.5 mg/dL — ABNORMAL HIGH (ref 0.0–0.3)
Total Protein: 6.9 g/dL (ref 6.0–8.3)

## 2012-06-28 LAB — AMMONIA: Ammonia: 17 umol/L (ref 11–35)

## 2012-06-28 MED ORDER — GABAPENTIN 600 MG PO TABS: 600.0000 mg | ORAL_TABLET | Freq: Four times a day (QID) | ORAL | Status: DC | PRN

## 2012-06-28 NOTE — Assessment & Plan Note (Addendum)
Continue with Gabapentin, increase the dose w/caution

## 2012-06-28 NOTE — Telephone Encounter (Signed)
[facility] called with pt results- 42,000 Platelet count - critical value

## 2012-06-28 NOTE — Assessment & Plan Note (Signed)
May need Xifaxan po

## 2012-06-28 NOTE — Assessment & Plan Note (Signed)
CBC today.  

## 2012-06-28 NOTE — Assessment & Plan Note (Signed)
Continue with current prescription therapy as reflected on the Med list.  

## 2012-06-28 NOTE — Assessment & Plan Note (Signed)
Hypoglycemic this am. Drinking Dr Reino Kent - better

## 2012-06-28 NOTE — Assessment & Plan Note (Signed)
  On diet  

## 2012-06-28 NOTE — Assessment & Plan Note (Signed)
No diarrhea

## 2012-06-28 NOTE — Progress Notes (Signed)
   Subjective:    Patient ID: [name], female    DOB: 1946/06/12, 66 y.o.   MRN: [id number]  HPI  The patient presents for a follow-up of  Chronic cirrhosis, hypertension, chronic dyslipidemia, type 2 diabetes controlled with medicines. C/o loose stools, no problems swallowing.2012. C/o HAs. C/o L shoulder pain - better. C/o shaky this am - fasting for labs. Glu=58.  Wt Readings from Last 3 Encounters:  06/28/12 191 lb (86.637 kg)  04/28/12 193 lb (87.544 kg)  02/25/12 195 lb (88.451 kg)   BP Readings from Last 3 Encounters:  06/28/12 120/70  04/28/12 130/70  02/25/12 130/80        Review of Systems  Constitutional: Positive for fatigue. Negative for chills, activity change, appetite change and unexpected weight change.  HENT: Negative for mouth sores.   Eyes: Negative for visual disturbance.  Respiratory: Negative for chest tightness.   Genitourinary: Negative for frequency, difficulty urinating and vaginal pain.  Musculoskeletal: Positive for back pain, arthralgias and gait problem.  Skin: Negative for pallor and rash.  Neurological: Positive for dizziness, weakness and light-headedness. Negative for tremors.  Hematological: Negative for adenopathy. Bruises/bleeds easily.  Psychiatric/Behavioral: Positive for decreased concentration. Negative for suicidal ideas, confusion and disturbed wake/sleep cycle. The patient is nervous/anxious.        Objective:   Physical Exam  Constitutional: She appears well-developed. No distress.       obese  HENT:  Head: Normocephalic.  Right Ear: External ear normal.  Left Ear: External ear normal.  Nose: Nose normal.  Mouth/Throat: Oropharynx is clear and moist.  Eyes: Conjunctivae normal are normal. Pupils are equal, round, and reactive to light. Right eye exhibits no discharge. Left eye exhibits no discharge.  Neck: Normal range of motion. Neck supple. No JVD present. No tracheal deviation present. No thyromegaly present.    Cardiovascular: Normal rate, regular rhythm and normal heart sounds.   Pulmonary/Chest: No stridor. No respiratory distress. She has no wheezes.  Abdominal: Soft. Bowel sounds are normal. She exhibits no distension and no mass. There is no tenderness. There is no rebound and no guarding.  Musculoskeletal: She exhibits edema (trace) and tenderness (B knees).  Lymphadenopathy:    She has no cervical adenopathy.  Neurological: She displays normal reflexes. No cranial nerve deficit. She exhibits normal muscle tone. Coordination abnormal.  Skin: No rash noted. No erythema. No pallor.  Psychiatric: She has a normal mood and affect. Her behavior is normal. Judgment and thought content normal.  B calluces lat dist feet R>>L   Lab Results  Component Value Date   WBC 2.0 Repeated and verified X2.* 04/26/2012   HGB 12.8 04/26/2012   HCT 37.9 04/26/2012   PLT 32.0 Repeated and verified X2.* 04/26/2012   GLUCOSE 374* 04/26/2012   CHOL 159 06/13/2009   TRIG 37.0 06/13/2009   HDL 66.50 06/13/2009   LDLCALC 85 06/13/2009   ALT 42* 04/26/2012   AST 48* 04/26/2012   NA 136 04/26/2012   K 4.1 04/26/2012   CL 101 04/26/2012   CREATININE 0.7 04/26/2012   BUN 16 04/26/2012   CO2 28 04/26/2012   TSH 1.93 04/02/2011   INR 1.2* 04/26/2012   HGBA1C 11.0* 02/22/2012        Assessment & Plan:

## 2012-06-30 ENCOUNTER — Ambulatory Visit: Payer: Medicare Other | Admitting: Internal Medicine

## 2012-08-01 ENCOUNTER — Encounter: Payer: Self-pay | Admitting: Internal Medicine

## 2012-08-01 ENCOUNTER — Ambulatory Visit (INDEPENDENT_AMBULATORY_CARE_PROVIDER_SITE_OTHER): Payer: Medicare Other | Admitting: Internal Medicine

## 2012-08-01 VITALS — BP 144/80 | HR 84 | Temp 98.6°F | Resp 16 | Wt 188.0 lb

## 2012-08-01 DIAGNOSIS — E119 Type 2 diabetes mellitus without complications: Secondary | ICD-10-CM

## 2012-08-01 DIAGNOSIS — R7309 Other abnormal glucose: Secondary | ICD-10-CM

## 2012-08-01 DIAGNOSIS — R739 Hyperglycemia, unspecified: Secondary | ICD-10-CM | POA: Insufficient documentation

## 2012-08-01 MED ORDER — INSULIN LISPRO 100 UNIT/ML ~~LOC~~ SOLN: 20.0000 [IU] | Freq: Three times a day (TID) | SUBCUTANEOUS | Status: DC

## 2012-08-01 MED ORDER — FESOTERODINE FUMARATE ER 4 MG PO TB24: 4.0000 mg | ORAL_TABLET | Freq: Every day | ORAL | Status: DC

## 2012-08-01 NOTE — Progress Notes (Signed)
   Subjective:    Patient ID: [name], female    DOB: Feb 23, 1946, 66 y.o.   MRN: [id number]  HPI  CBG was 508 at Dr Eps office The patient presents for a follow-up of  Chronic cirrhosis, hypertension, chronic dyslipidemia, type 2 diabetes controlled with medicines. C/o loose stools, no problems swallowing.2012. C/o HAs. C/o L shoulder pain - better. C/o shaky this am - fasting for labs. Glu=58.  Wt Readings from Last 3 Encounters:  08/01/12 188 lb (85.276 kg)  06/28/12 191 lb (86.637 kg)  04/28/12 193 lb (87.544 kg)   BP Readings from Last 3 Encounters:  08/01/12 144/80  06/28/12 120/70  04/28/12 130/70        Review of Systems  Constitutional: Positive for fatigue. Negative for chills, activity change, appetite change and unexpected weight change.  HENT: Negative for mouth sores.   Eyes: Negative for visual disturbance.  Respiratory: Negative for chest tightness.   Genitourinary: Negative for frequency, difficulty urinating and vaginal pain.  Musculoskeletal: Positive for back pain, arthralgias and gait problem.  Skin: Negative for pallor and rash.  Neurological: Positive for dizziness, weakness and light-headedness. Negative for tremors.  Hematological: Negative for adenopathy. Bruises/bleeds easily.  Psychiatric/Behavioral: Positive for decreased concentration. Negative for suicidal ideas, confusion and disturbed wake/sleep cycle. The patient is nervous/anxious.        Objective:   Physical Exam  Constitutional: She appears well-developed. No distress.       obese  HENT:  Head: Normocephalic.  Right Ear: External ear normal.  Left Ear: External ear normal.  Nose: Nose normal.  Mouth/Throat: Oropharynx is clear and moist.  Eyes: Conjunctivae normal are normal. Pupils are equal, round, and reactive to light. Right eye exhibits no discharge. Left eye exhibits no discharge.  Neck: Normal range of motion. Neck supple. No JVD present. No tracheal deviation present. No  thyromegaly present.  Cardiovascular: Normal rate, regular rhythm and normal heart sounds.   Pulmonary/Chest: No stridor. No respiratory distress. She has no wheezes.  Abdominal: Soft. Bowel sounds are normal. She exhibits no distension and no mass. There is no tenderness. There is no rebound and no guarding.  Musculoskeletal: She exhibits edema (trace) and tenderness (B knees).  Lymphadenopathy:    She has no cervical adenopathy.  Neurological: She displays normal reflexes. No cranial nerve deficit. She exhibits normal muscle tone. Coordination abnormal.  Skin: No rash noted. No erythema. No pallor.  Psychiatric: She has a normal mood and affect. Her behavior is normal. Judgment and thought content normal.  B calluces lat dist feet R>>L   Lab Results  Component Value Date   WBC 2.8* 06/28/2012   HGB 13.9 06/28/2012   HCT 41.3 06/28/2012   PLT 42.0* 06/28/2012   GLUCOSE 374* 04/26/2012   CHOL 159 06/13/2009   TRIG 37.0 06/13/2009   HDL 66.50 06/13/2009   LDLCALC 85 06/13/2009   ALT 44* 06/28/2012   AST 60* 06/28/2012   NA 136 04/26/2012   K 4.1 04/26/2012   CL 101 04/26/2012   CREATININE 0.7 04/26/2012   BUN 16 04/26/2012   CO2 28 04/26/2012   TSH 1.93 04/02/2011   INR 1.2* 06/28/2012   HGBA1C 10.6* 06/28/2012        Assessment & Plan:

## 2012-08-01 NOTE — Assessment & Plan Note (Signed)
10/13 CBG was 508 at Dr [place] office

## 2012-08-01 NOTE — Assessment & Plan Note (Signed)
  CBG was 508 at Dr [place] office

## 2012-08-09 ENCOUNTER — Encounter: Payer: Self-pay | Admitting: Internal Medicine

## 2012-08-09 ENCOUNTER — Ambulatory Visit (INDEPENDENT_AMBULATORY_CARE_PROVIDER_SITE_OTHER)
Admission: RE | Admit: 2012-08-09 | Discharge: 2012-08-09 | Disposition: A | Discharge status: Home / Self Care | Payer: Medicare Other | Source: Ambulatory Visit | Attending: Internal Medicine | Admitting: Internal Medicine

## 2012-08-09 ENCOUNTER — Ambulatory Visit (INDEPENDENT_AMBULATORY_CARE_PROVIDER_SITE_OTHER): Payer: Medicare Other | Admitting: Internal Medicine

## 2012-08-09 VITALS — BP 120/78 | HR 80 | Temp 97.2°F | Resp 16

## 2012-08-09 DIAGNOSIS — M79672 Pain in left foot: Secondary | ICD-10-CM

## 2012-08-09 DIAGNOSIS — M79609 Pain in unspecified limb: Secondary | ICD-10-CM

## 2012-08-09 DIAGNOSIS — E119 Type 2 diabetes mellitus without complications: Secondary | ICD-10-CM

## 2012-08-09 LAB — GLUCOSE, POCT (MANUAL RESULT ENTRY): POC Glucose: 159 mg/dl — AB (ref 70–99)

## 2012-08-09 NOTE — Assessment & Plan Note (Addendum)
11/13 s/p twisting injury - severe pain R/o fx - xray Post-op shoe ACE

## 2012-08-09 NOTE — Progress Notes (Signed)
Patient ID: [name], female   DOB: September 20, 1946, 66 y.o.   MRN: [id number]   Subjective:    Patient ID: [name], female    DOB: [birth year], 66 y.o.   MRN: [id number]  HPI   The patient presents for a follow-up of  Chronic cirrhosis, hypertension, chronic dyslipidemia, type 2 diabetes controlled with medicines. C/o loose stools, no problems swallowing.2012. C/o HAs. C/o L foot pain x several days - she twisted her foot   Wt Readings from Last 3 Encounters:  08/01/12 188 lb (85.276 kg)  06/28/12 191 lb (86.637 kg)  04/28/12 193 lb (87.544 kg)   BP Readings from Last 3 Encounters:  08/09/12 120/78  08/01/12 144/80  06/28/12 120/70        Review of Systems  Constitutional: Positive for fatigue. Negative for chills, activity change, appetite change and unexpected weight change.  HENT: Negative for mouth sores.   Eyes: Negative for visual disturbance.  Respiratory: Negative for chest tightness.   Genitourinary: Negative for frequency, difficulty urinating and vaginal pain.  Musculoskeletal: Positive for back pain, arthralgias and gait problem.  Skin: Negative for pallor and rash.  Neurological: Positive for dizziness, weakness and light-headedness. Negative for tremors.  Hematological: Negative for adenopathy. Bruises/bleeds easily.  Psychiatric/Behavioral: Positive for decreased concentration. Negative for suicidal ideas, confusion and sleep disturbance. The patient is nervous/anxious.        Objective:   Physical Exam  Constitutional: She appears well-developed. No distress.       obese  HENT:  Head: Normocephalic.  Right Ear: External ear normal.  Left Ear: External ear normal.  Nose: Nose normal.  Mouth/Throat: Oropharynx is clear and moist.  Eyes: Conjunctivae normal are normal. Pupils are equal, round, and reactive to light. Right eye exhibits no discharge. Left eye exhibits no discharge.  Neck: Normal range of motion. Neck supple. No JVD present. No tracheal  deviation present. No thyromegaly present.  Cardiovascular: Normal rate, regular rhythm and normal heart sounds.   Pulmonary/Chest: No stridor. No respiratory distress. She has no wheezes.  Abdominal: Soft. Bowel sounds are normal. She exhibits no distension and no mass. There is no tenderness. There is no rebound and no guarding.  Musculoskeletal: She exhibits edema (trace) and tenderness (B knees).  Lymphadenopathy:    She has no cervical adenopathy.  Neurological: She displays normal reflexes. No cranial nerve deficit. She exhibits normal muscle tone. Coordination abnormal.  Skin: No rash noted. No erythema. No pallor.  Psychiatric: She has a normal mood and affect. Her behavior is normal. Judgment and thought content normal.      Lab Results  Component Value Date   WBC 2.8* 06/28/2012   HGB 13.9 06/28/2012   HCT 41.3 06/28/2012   PLT 42.0* 06/28/2012   GLUCOSE 374* 04/26/2012   CHOL 159 06/13/2009   TRIG 37.0 06/13/2009   HDL 66.50 06/13/2009   LDLCALC 85 06/13/2009   ALT 44* 06/28/2012   AST 60* 06/28/2012   NA 136 04/26/2012   K 4.1 04/26/2012   CL 101 04/26/2012   CREATININE 0.7 04/26/2012   BUN 16 04/26/2012   CO2 28 04/26/2012   TSH 1.93 04/02/2011   INR 1.2* 06/28/2012   HGBA1C 10.6* 06/28/2012        Assessment & Plan:

## 2012-08-09 NOTE — Assessment & Plan Note (Signed)
11/13 better Continue with current prescription therapy as reflected on the Med list.

## 2012-08-10 ENCOUNTER — Telehealth: Payer: Self-pay | Admitting: Internal Medicine

## 2012-08-10 DIAGNOSIS — M79673 Pain in unspecified foot: Secondary | ICD-10-CM

## 2012-08-10 NOTE — Telephone Encounter (Signed)
Called pt no answer. Will try again later.

## 2012-08-10 NOTE — Telephone Encounter (Signed)
Misty Stanley, please, inform patient that she has a broken 5th digit Cont ACE wrap, surg shoe She should f/up w/her podiatrist Thx

## 2012-08-10 NOTE — Telephone Encounter (Signed)
I talked to the patient for [facility].  She doesn't have a Podiatrist anymore and wants a referral to one. She is still in a lot of pain and having problems sleeping because of the pain.

## 2012-08-11 NOTE — Telephone Encounter (Signed)
Ok. Done 

## 2012-09-13 ENCOUNTER — Ambulatory Visit: Payer: Medicare Other | Admitting: Internal Medicine

## 2012-09-15 ENCOUNTER — Ambulatory Visit (INDEPENDENT_AMBULATORY_CARE_PROVIDER_SITE_OTHER): Payer: Medicare Other | Admitting: Internal Medicine

## 2012-09-15 ENCOUNTER — Telehealth: Payer: Self-pay

## 2012-09-15 ENCOUNTER — Other Ambulatory Visit: Payer: Self-pay | Admitting: Internal Medicine

## 2012-09-15 ENCOUNTER — Other Ambulatory Visit (INDEPENDENT_AMBULATORY_CARE_PROVIDER_SITE_OTHER): Payer: Medicare Other

## 2012-09-15 ENCOUNTER — Encounter: Payer: Self-pay | Admitting: Internal Medicine

## 2012-09-15 VITALS — BP 100/62 | HR 72 | Temp 98.0°F | Resp 16 | Wt 192.0 lb

## 2012-09-15 DIAGNOSIS — E119 Type 2 diabetes mellitus without complications: Secondary | ICD-10-CM

## 2012-09-15 DIAGNOSIS — R739 Hyperglycemia, unspecified: Secondary | ICD-10-CM

## 2012-09-15 DIAGNOSIS — F3289 Other specified depressive episodes: Secondary | ICD-10-CM

## 2012-09-15 DIAGNOSIS — K7460 Unspecified cirrhosis of liver: Secondary | ICD-10-CM

## 2012-09-15 DIAGNOSIS — M25569 Pain in unspecified knee: Secondary | ICD-10-CM

## 2012-09-15 DIAGNOSIS — D696 Thrombocytopenia, unspecified: Secondary | ICD-10-CM

## 2012-09-15 DIAGNOSIS — IMO0001 Reserved for inherently not codable concepts without codable children: Secondary | ICD-10-CM

## 2012-09-15 DIAGNOSIS — F329 Major depressive disorder, single episode, unspecified: Secondary | ICD-10-CM

## 2012-09-15 DIAGNOSIS — M797 Fibromyalgia: Secondary | ICD-10-CM

## 2012-09-15 DIAGNOSIS — R7309 Other abnormal glucose: Secondary | ICD-10-CM

## 2012-09-15 LAB — HEPATIC FUNCTION PANEL
Albumin: 2.8 g/dL — ABNORMAL LOW (ref 3.5–5.2)
Alkaline Phosphatase: 113 U/L (ref 39–117)
Total Protein: 7.7 g/dL (ref 6.0–8.3)

## 2012-09-15 LAB — CBC WITH DIFFERENTIAL/PLATELET
Basophils Absolute: 0 10*3/uL (ref 0.0–0.1)
Eosinophils Relative: 6.2 % — ABNORMAL HIGH (ref 0.0–5.0)
HCT: 40 % (ref 36.0–46.0)
Lymphocytes Relative: 23.7 % (ref 12.0–46.0)
Lymphs Abs: 0.7 10*3/uL (ref 0.7–4.0)
Monocytes Relative: 8.2 % (ref 3.0–12.0)
Neutrophils Relative %: 61.8 % (ref 43.0–77.0)
Platelets: 39 10*3/uL — CL (ref 150.0–400.0)
RDW: 16.4 % — ABNORMAL HIGH (ref 11.5–14.6)
WBC: 2.9 10*3/uL — ABNORMAL LOW (ref 4.5–10.5)

## 2012-09-15 LAB — BASIC METABOLIC PANEL
CO2: 26 mEq/L (ref 19–32)
Calcium: 9.1 mg/dL (ref 8.4–10.5)
Creatinine, Ser: 0.9 mg/dL (ref 0.4–1.2)
GFR: 67.7 mL/min (ref >60.00)
Glucose, Bld: 255 mg/dL — ABNORMAL HIGH (ref 70–99)
Sodium: 132 mEq/L — ABNORMAL LOW (ref 135–145)

## 2012-09-15 LAB — PROTIME-INR
INR: 1.2 ratio — ABNORMAL HIGH (ref 0.8–1.0)
Prothrombin Time: 13 s — ABNORMAL HIGH (ref 10.2–12.4)

## 2012-09-15 LAB — HEMOGLOBIN A1C: Hgb A1c MFr Bld: 9.5 % — ABNORMAL HIGH (ref 4.6–6.5)

## 2012-09-15 LAB — AMMONIA: Ammonia: 33 umol/L (ref 11–35)

## 2012-09-15 NOTE — Assessment & Plan Note (Signed)
Labs  Continue with current prescription therapy as reflected on the Med list.  

## 2012-09-15 NOTE — Assessment & Plan Note (Signed)
Continue with current prescription therapy as reflected on the Med list.  

## 2012-09-15 NOTE — Assessment & Plan Note (Signed)
CBC

## 2012-09-15 NOTE — Progress Notes (Signed)
Subjective:    Patient ID: [name], female    DOB: 06/26/1946, 66 y.o.   MRN: [id number]  HPI   The patient presents for a follow-up of  Chronic cirrhosis, hypertension, chronic dyslipidemia, type 2 diabetes controlled with medicines. C/o loose stools, no problems swallowing. C/o HAs. C/o L foot pain several days - she twisted her foot 2 mo ago - better   Wt Readings from Last 3 Encounters:  09/15/12 192 lb (87.091 kg)  08/01/12 188 lb (85.276 kg)  06/28/12 191 lb (86.637 kg)   BP Readings from Last 3 Encounters:  09/15/12 100/62  08/09/12 120/78  08/01/12 144/80        Review of Systems  Constitutional: Positive for fatigue. Negative for chills, activity change, appetite change and unexpected weight change.  HENT: Negative for mouth sores.   Eyes: Negative for visual disturbance.  Respiratory: Negative for chest tightness.   Genitourinary: Negative for frequency, difficulty urinating and vaginal pain.  Musculoskeletal: Positive for back pain, arthralgias and gait problem.  Skin: Negative for pallor and rash.  Neurological: Positive for dizziness, weakness and light-headedness. Negative for tremors.  Hematological: Negative for adenopathy. Bruises/bleeds easily.  Psychiatric/Behavioral: Positive for decreased concentration. Negative for suicidal ideas, confusion and sleep disturbance. The patient is nervous/anxious.        Objective:   Physical Exam  Constitutional: She appears well-developed. No distress.       obese  HENT:  Head: Normocephalic.  Right Ear: External ear normal.  Left Ear: External ear normal.  Nose: Nose normal.  Mouth/Throat: Oropharynx is clear and moist.  Eyes: Conjunctivae normal are normal. Pupils are equal, round, and reactive to light. Right eye exhibits no discharge. Left eye exhibits no discharge.  Neck: Normal range of motion. Neck supple. No JVD present. No tracheal deviation present. No thyromegaly present.  Cardiovascular: Normal  rate, regular rhythm and normal heart sounds.   Pulmonary/Chest: No stridor. No respiratory distress. She has no wheezes.  Abdominal: Soft. Bowel sounds are normal. She exhibits no distension and no mass. There is no tenderness. There is no rebound and no guarding.  Musculoskeletal: She exhibits edema (trace) and tenderness (B knees).  Lymphadenopathy:    She has no cervical adenopathy.  Neurological: She displays normal reflexes. No cranial nerve deficit. She exhibits normal muscle tone. Coordination abnormal.  Skin: No rash noted. No erythema. No pallor.  Psychiatric: She has a normal mood and affect. Her behavior is normal. Judgment and thought content normal.  R lat foot callus    Lab Results  Component Value Date   WBC 2.8* 06/28/2012   HGB 13.9 06/28/2012   HCT 41.3 06/28/2012   PLT 42.0* 06/28/2012   GLUCOSE 374* 04/26/2012   CHOL 159 06/13/2009   TRIG 37.0 06/13/2009   HDL 66.50 06/13/2009   LDLCALC 85 06/13/2009   ALT 44* 06/28/2012   AST 60* 06/28/2012   NA 136 04/26/2012   K 4.1 04/26/2012   CL 101 04/26/2012   CREATININE 0.7 04/26/2012   BUN 16 04/26/2012   CO2 28 04/26/2012   TSH 1.93 04/02/2011   INR 1.2* 06/28/2012   HGBA1C 10.6* 06/28/2012   Procedure:   R foot callus paring/cutting  Indication:    foot callus, painful  Consent: verbal  Risks and benefits were explained to the patient. Skin was cleaned with alcohol. I removed a large callus carefully with a round blade. Skin remained intact. Pain is better. Tolerated well. Complications: none. Bandaid applied  Assessment & Plan:

## 2012-09-15 NOTE — Telephone Encounter (Signed)
FYI... With [facility] lab called to result a critical platelet count of 39. Thanks

## 2012-09-16 ENCOUNTER — Telehealth: Payer: Self-pay | Admitting: Internal Medicine

## 2012-09-16 NOTE — Telephone Encounter (Signed)
Copies mailed to pt

## 2012-09-16 NOTE — Telephone Encounter (Signed)
[name], please, inform patient that all labs are overall stable Thx

## 2012-11-07 ENCOUNTER — Other Ambulatory Visit: Payer: Self-pay | Admitting: Internal Medicine

## 2012-11-25 ENCOUNTER — Other Ambulatory Visit: Payer: Self-pay | Admitting: Internal Medicine

## 2012-11-28 ENCOUNTER — Other Ambulatory Visit: Payer: Self-pay | Admitting: *Deleted

## 2012-11-28 ENCOUNTER — Other Ambulatory Visit: Payer: Medicare Other

## 2012-11-28 DIAGNOSIS — D509 Iron deficiency anemia, unspecified: Secondary | ICD-10-CM

## 2012-11-28 DIAGNOSIS — I10 Essential (primary) hypertension: Secondary | ICD-10-CM

## 2012-11-28 DIAGNOSIS — D696 Thrombocytopenia, unspecified: Secondary | ICD-10-CM

## 2012-11-28 DIAGNOSIS — E119 Type 2 diabetes mellitus without complications: Secondary | ICD-10-CM

## 2012-12-01 ENCOUNTER — Encounter: Payer: Self-pay | Admitting: Internal Medicine

## 2012-12-01 ENCOUNTER — Telehealth: Payer: Self-pay | Admitting: Internal Medicine

## 2012-12-01 ENCOUNTER — Ambulatory Visit (INDEPENDENT_AMBULATORY_CARE_PROVIDER_SITE_OTHER): Payer: Medicare PPO | Admitting: Internal Medicine

## 2012-12-01 ENCOUNTER — Other Ambulatory Visit (INDEPENDENT_AMBULATORY_CARE_PROVIDER_SITE_OTHER): Payer: Medicare PPO

## 2012-12-01 VITALS — BP 130/68 | HR 72 | Temp 97.7°F | Resp 16 | Wt 187.0 lb

## 2012-12-01 DIAGNOSIS — D696 Thrombocytopenia, unspecified: Secondary | ICD-10-CM

## 2012-12-01 DIAGNOSIS — D509 Iron deficiency anemia, unspecified: Secondary | ICD-10-CM

## 2012-12-01 DIAGNOSIS — M797 Fibromyalgia: Secondary | ICD-10-CM

## 2012-12-01 DIAGNOSIS — I10 Essential (primary) hypertension: Secondary | ICD-10-CM

## 2012-12-01 DIAGNOSIS — E119 Type 2 diabetes mellitus without complications: Secondary | ICD-10-CM

## 2012-12-01 DIAGNOSIS — K7460 Unspecified cirrhosis of liver: Secondary | ICD-10-CM

## 2012-12-01 DIAGNOSIS — R51 Headache: Secondary | ICD-10-CM

## 2012-12-01 DIAGNOSIS — IMO0001 Reserved for inherently not codable concepts without codable children: Secondary | ICD-10-CM

## 2012-12-01 LAB — CBC WITH DIFFERENTIAL/PLATELET
Basophils Absolute: 0 10*3/uL (ref 0.0–0.1)
Eosinophils Absolute: 0.2 10*3/uL (ref 0.0–0.7)
HCT: 40.7 % (ref 36.0–46.0)
Hemoglobin: 13.8 g/dL (ref 12.0–15.0)
Lymphs Abs: 0.7 10*3/uL (ref 0.7–4.0)
MCHC: 33.9 g/dL (ref 30.0–36.0)
Neutro Abs: 2.1 10*3/uL (ref 1.4–7.7)
RDW: 16.8 % — ABNORMAL HIGH (ref 11.5–14.6)

## 2012-12-01 LAB — BASIC METABOLIC PANEL
CO2: 27 mEq/L (ref 19–32)
Chloride: 107 mEq/L (ref 96–112)
Glucose, Bld: 80 mg/dL (ref 70–99)
Sodium: 139 mEq/L (ref 135–145)

## 2012-12-01 LAB — HEPATIC FUNCTION PANEL
ALT: 38 U/L — ABNORMAL HIGH (ref 0–35)
Alkaline Phosphatase: 106 U/L (ref 39–117)
Bilirubin, Direct: 0.6 mg/dL — ABNORMAL HIGH (ref 0.0–0.3)
Total Protein: 7.4 g/dL (ref 6.0–8.3)

## 2012-12-01 MED ORDER — GABAPENTIN 800 MG PO TABS: 800.0000 mg | ORAL_TABLET | Freq: Three times a day (TID) | ORAL | Status: DC | PRN

## 2012-12-01 NOTE — Assessment & Plan Note (Signed)
Watching labs 

## 2012-12-01 NOTE — Assessment & Plan Note (Addendum)
Continue with current prescription therapy as reflected on the Med list - we increased Gabapentin

## 2012-12-01 NOTE — Assessment & Plan Note (Signed)
Labs

## 2012-12-01 NOTE — Assessment & Plan Note (Signed)
Better  

## 2012-12-01 NOTE — Assessment & Plan Note (Signed)
Wt Readings from Last 3 Encounters:  12/01/12 187 lb (84.823 kg)  09/15/12 192 lb (87.091 kg)  08/01/12 188 lb (85.276 kg)

## 2012-12-01 NOTE — Telephone Encounter (Signed)
[name], please, inform patient that her DM control is better Thx

## 2012-12-01 NOTE — Progress Notes (Signed)
   Subjective:    HPI   The patient presents for a follow-up of  Chronic cirrhosis, hypertension, chronic dyslipidemia, type 2 diabetes controlled with medicines. C/o loose stools, no problems swallowing. C/o HAs.  L foot pain resolved. C/o upper and lower back pain - chronic   Wt Readings from Last 3 Encounters:  12/01/12 187 lb (84.823 kg)  09/15/12 192 lb (87.091 kg)  08/01/12 188 lb (85.276 kg)   BP Readings from Last 3 Encounters:  12/01/12 130/68  09/15/12 100/62  08/09/12 120/78        Review of Systems  Constitutional: Positive for fatigue. Negative for chills, activity change, appetite change and unexpected weight change.  HENT: Negative for mouth sores.   Eyes: Negative for visual disturbance.  Respiratory: Negative for chest tightness.   Genitourinary: Negative for frequency, difficulty urinating and vaginal pain.  Musculoskeletal: Positive for back pain, arthralgias and gait problem.  Skin: Negative for pallor and rash.  Neurological: Positive for dizziness, weakness and light-headedness. Negative for tremors.  Hematological: Negative for adenopathy. Bruises/bleeds easily.  Psychiatric/Behavioral: Positive for decreased concentration. Negative for suicidal ideas, confusion and sleep disturbance. The patient is nervous/anxious.        Objective:   Physical Exam  Constitutional: She appears well-developed. No distress.  obese  HENT:  Head: Normocephalic.  Right Ear: External ear normal.  Left Ear: External ear normal.  Nose: Nose normal.  Mouth/Throat: Oropharynx is clear and moist.  Eyes: Conjunctivae are normal. Pupils are equal, round, and reactive to light. Right eye exhibits no discharge. Left eye exhibits no discharge.  Neck: Normal range of motion. Neck supple. No JVD present. No tracheal deviation present. No thyromegaly present.  Cardiovascular: Normal rate, regular rhythm and normal heart sounds.   Pulmonary/Chest: No stridor. No respiratory  distress. She has no wheezes.  Abdominal: Soft. Bowel sounds are normal. She exhibits no distension and no mass. There is no tenderness. There is no rebound and no guarding.  Musculoskeletal: She exhibits edema (trace) and tenderness (B knees).  Lymphadenopathy:    She has no cervical adenopathy.  Neurological: She displays normal reflexes. No cranial nerve deficit. She exhibits normal muscle tone. Coordination abnormal.  Skin: No rash noted. No erythema. No pallor.  Psychiatric: She has a normal mood and affect. Her behavior is normal. Judgment and thought content normal.   Tender neck, shoulders, LS  Lab Results  Component Value Date   WBC 2.9* 09/15/2012   HGB 13.9 09/15/2012   HCT 40.0 09/15/2012   PLT 39.0 Repeated and verified X2.* 09/15/2012   GLUCOSE 255* 09/15/2012   CHOL 159 06/13/2009   TRIG 37.0 06/13/2009   HDL 66.50 06/13/2009   LDLCALC 85 06/13/2009   ALT 43* 09/15/2012   AST 61* 09/15/2012   NA 132* 09/15/2012   K 3.4* 09/15/2012   CL 99 09/15/2012   CREATININE 0.9 09/15/2012   BUN 20 09/15/2012   CO2 26 09/15/2012   TSH 1.93 04/02/2011   INR 1.2* 09/15/2012   HGBA1C 9.5* 09/15/2012         Assessment & Plan:

## 2012-12-01 NOTE — Assessment & Plan Note (Signed)
Labs are due

## 2012-12-01 NOTE — Assessment & Plan Note (Signed)
Continue with current prescription therapy as reflected on the Med list.  

## 2012-12-02 NOTE — Telephone Encounter (Signed)
Pt informed/ Copies mailed to pt.  

## 2012-12-13 ENCOUNTER — Other Ambulatory Visit: Payer: Self-pay | Admitting: Internal Medicine

## 2012-12-13 NOTE — Telephone Encounter (Signed)
[name] is unhappy with the dose on the Gabapentin.  She said she used to take a higher dose.  She is having feet and leg pain.

## 2012-12-13 NOTE — Telephone Encounter (Signed)
It is not a lower dose. If tolerated well - ok to take 800 mg qid Thx

## 2012-12-14 MED ORDER — GABAPENTIN 800 MG PO TABS: 800.0000 mg | ORAL_TABLET | Freq: Four times a day (QID) | ORAL | Status: DC | PRN

## 2012-12-14 NOTE — Telephone Encounter (Signed)
Called pt no answer °

## 2012-12-20 ENCOUNTER — Telehealth: Payer: Self-pay | Admitting: *Deleted

## 2012-12-20 NOTE — Telephone Encounter (Signed)
Caller stats she is patient's sister and lives in [place].  She is requesting a callback re: her sisters health and some concerns that she has.

## 2013-01-04 NOTE — Telephone Encounter (Signed)
I'm closing note due to this being in pts chart: Patient signed a Administrator, Civil Service stating that she does not wish for anyone to have access to her medical records/information. [facility] battle April 4,2011

## 2013-01-11 ENCOUNTER — Other Ambulatory Visit: Payer: Self-pay | Admitting: Internal Medicine

## 2013-02-14 ENCOUNTER — Other Ambulatory Visit: Payer: Self-pay | Admitting: Internal Medicine

## 2013-02-15 ENCOUNTER — Telehealth: Payer: Self-pay | Admitting: *Deleted

## 2013-02-15 NOTE — Telephone Encounter (Signed)
Rf req for Benzonatate 100 mg cap 1 po tid prn cough. # 120. Ok to Rf?

## 2013-02-15 NOTE — Telephone Encounter (Signed)
Ok to ref Thx 

## 2013-02-16 MED ORDER — BENZONATATE 100 MG PO CAPS: 100.0000 mg | ORAL_CAPSULE | Freq: Three times a day (TID) | ORAL | Status: DC | PRN

## 2013-02-16 NOTE — Telephone Encounter (Signed)
Done

## 2013-02-22 NOTE — Progress Notes (Signed)
This encounter was created in error - please disregard.

## 2013-03-09 ENCOUNTER — Other Ambulatory Visit (INDEPENDENT_AMBULATORY_CARE_PROVIDER_SITE_OTHER): Payer: Medicare PPO

## 2013-03-09 ENCOUNTER — Ambulatory Visit (INDEPENDENT_AMBULATORY_CARE_PROVIDER_SITE_OTHER): Payer: Medicare PPO | Admitting: Internal Medicine

## 2013-03-09 ENCOUNTER — Telehealth: Payer: Self-pay | Admitting: Internal Medicine

## 2013-03-09 ENCOUNTER — Encounter: Payer: Self-pay | Admitting: Internal Medicine

## 2013-03-09 VITALS — BP 150/80 | HR 72 | Temp 98.0°F | Resp 16 | Ht 63.0 in | Wt 180.0 lb

## 2013-03-09 DIAGNOSIS — R202 Paresthesia of skin: Secondary | ICD-10-CM

## 2013-03-09 DIAGNOSIS — M797 Fibromyalgia: Secondary | ICD-10-CM

## 2013-03-09 DIAGNOSIS — I10 Essential (primary) hypertension: Secondary | ICD-10-CM

## 2013-03-09 DIAGNOSIS — E119 Type 2 diabetes mellitus without complications: Secondary | ICD-10-CM

## 2013-03-09 DIAGNOSIS — Z0000 Encounter for general adult medical examination without abnormal findings: Secondary | ICD-10-CM

## 2013-03-09 DIAGNOSIS — D696 Thrombocytopenia, unspecified: Secondary | ICD-10-CM

## 2013-03-09 DIAGNOSIS — R209 Unspecified disturbances of skin sensation: Secondary | ICD-10-CM

## 2013-03-09 DIAGNOSIS — N390 Urinary tract infection, site not specified: Secondary | ICD-10-CM

## 2013-03-09 DIAGNOSIS — F329 Major depressive disorder, single episode, unspecified: Secondary | ICD-10-CM

## 2013-03-09 DIAGNOSIS — IMO0001 Reserved for inherently not codable concepts without codable children: Secondary | ICD-10-CM

## 2013-03-09 DIAGNOSIS — D509 Iron deficiency anemia, unspecified: Secondary | ICD-10-CM

## 2013-03-09 DIAGNOSIS — M545 Low back pain: Secondary | ICD-10-CM

## 2013-03-09 DIAGNOSIS — Z23 Encounter for immunization: Secondary | ICD-10-CM

## 2013-03-09 LAB — BASIC METABOLIC PANEL
CO2: 23 mEq/L (ref 19–32)
Calcium: 9.6 mg/dL (ref 8.4–10.5)
Chloride: 107 mEq/L (ref 96–112)
Creatinine, Ser: 0.7 mg/dL (ref 0.4–1.2)
Sodium: 138 mEq/L (ref 135–145)

## 2013-03-09 LAB — IBC PANEL
Iron: 99 ug/dL (ref 42–145)
Transferrin: 253.3 mg/dL (ref 212.0–360.0)

## 2013-03-09 LAB — CBC WITH DIFFERENTIAL/PLATELET
Basophils Absolute: 0 10*3/uL (ref 0.0–0.1)
Basophils Relative: 0.3 % (ref 0.0–3.0)
Eosinophils Absolute: 0.2 10*3/uL (ref 0.0–0.7)
Lymphocytes Relative: 23.5 % (ref 12.0–46.0)
MCHC: 34.7 g/dL (ref 30.0–36.0)
MCV: 95.3 fl (ref 78.0–100.0)
Monocytes Absolute: 0.3 10*3/uL (ref 0.1–1.0)
Neutrophils Relative %: 60.4 % (ref 43.0–77.0)
Platelets: 40 10*3/uL — CL (ref 150.0–400.0)
RBC: 3.95 Mil/uL (ref 3.87–5.11)

## 2013-03-09 LAB — URINALYSIS, ROUTINE W REFLEX MICROSCOPIC
Bilirubin Urine: NEGATIVE
Total Protein, Urine: 30
Urine Glucose: 250
Urobilinogen, UA: 2 (ref 0.0–1.0)

## 2013-03-09 LAB — PROTIME-INR
INR: 1.3 ratio — ABNORMAL HIGH (ref 0.8–1.0)
Prothrombin Time: 13.2 s — ABNORMAL HIGH (ref 10.2–12.4)

## 2013-03-09 LAB — TSH: TSH: 3.43 u[IU]/mL (ref 0.35–5.50)

## 2013-03-09 LAB — HEMOGLOBIN A1C: Hgb A1c MFr Bld: 10.6 % — ABNORMAL HIGH (ref 4.6–6.5)

## 2013-03-09 LAB — HEPATIC FUNCTION PANEL
ALT: 34 U/L (ref 0–35)
Total Bilirubin: 1.9 mg/dL — ABNORMAL HIGH (ref 0.3–1.2)
Total Protein: 7.2 g/dL (ref 6.0–8.3)

## 2013-03-09 NOTE — Assessment & Plan Note (Signed)
CBC

## 2013-03-09 NOTE — Progress Notes (Signed)
   Subjective:    HPI  The patient is here for a wellness exam.  The patient presents for a follow-up of chronic cirrhosis, hypertension, chronic dyslipidemia, type 2 diabetes controlled with medicines. C/o loose stools, no problems swallowing. C/o HAs.  L foot pain resolved. C/o upper and lower back pain - chronic   Wt Readings from Last 3 Encounters:  03/09/13 180 lb (81.647 kg)  12/01/12 187 lb (84.823 kg)  09/15/12 192 lb (87.091 kg)   BP Readings from Last 3 Encounters:  03/09/13 150/80  12/01/12 130/68  09/15/12 100/62        Review of Systems  Constitutional: Positive for fatigue. Negative for chills, activity change, appetite change and unexpected weight change.  HENT: Negative for mouth sores.   Eyes: Negative for visual disturbance.  Respiratory: Negative for chest tightness.   Genitourinary: Negative for frequency, difficulty urinating and vaginal pain.  Musculoskeletal: Positive for back pain, arthralgias and gait problem.  Skin: Negative for pallor and rash.  Neurological: Positive for dizziness, weakness and light-headedness. Negative for tremors.  Hematological: Negative for adenopathy. Bruises/bleeds easily.  Psychiatric/Behavioral: Positive for decreased concentration. Negative for suicidal ideas, confusion and sleep disturbance. The patient is nervous/anxious.        Objective:   Physical Exam  Constitutional: She appears well-developed. No distress.  obese  HENT:  Head: Normocephalic.  Right Ear: External ear normal.  Left Ear: External ear normal.  Nose: Nose normal.  Mouth/Throat: Oropharynx is clear and moist.  Eyes: Conjunctivae are normal. Pupils are equal, round, and reactive to light. Right eye exhibits no discharge. Left eye exhibits no discharge.  Neck: Normal range of motion. Neck supple. No JVD present. No tracheal deviation present. No thyromegaly present.  Cardiovascular: Normal rate, regular rhythm and normal heart sounds.    Pulmonary/Chest: No stridor. No respiratory distress. She has no wheezes.  Abdominal: Soft. Bowel sounds are normal. She exhibits no distension and no mass. There is no tenderness. There is no rebound and no guarding.  Musculoskeletal: She exhibits edema (trace) and tenderness (B knees).  Lymphadenopathy:    She has no cervical adenopathy.  Neurological: She displays normal reflexes. No cranial nerve deficit. She exhibits normal muscle tone. Coordination abnormal.  Skin: No rash noted. No erythema. No pallor.  Psychiatric: She has a normal mood and affect. Her behavior is normal. Judgment and thought content normal.   Tender neck, shoulders, LS  Lab Results  Component Value Date   WBC 3.3* 12/01/2012   HGB 13.8 12/01/2012   HCT 40.7 12/01/2012   PLT 40.0 Repeated and verified X2.* 12/01/2012   GLUCOSE 80 12/01/2012   CHOL 159 06/13/2009   TRIG 37.0 06/13/2009   HDL 66.50 06/13/2009   LDLCALC 85 06/13/2009   ALT 38* 12/01/2012   AST 59* 12/01/2012   NA 139 12/01/2012   K 3.5 12/01/2012   CL 107 12/01/2012   CREATININE 0.8 12/01/2012   BUN 12 12/01/2012   CO2 27 12/01/2012   TSH 1.93 04/02/2011   INR 1.4* 12/01/2012   HGBA1C 8.3* 12/01/2012         Assessment & Plan:

## 2013-03-09 NOTE — Assessment & Plan Note (Signed)
Continue with current prescription therapy as reflected on the Med list.  

## 2013-03-09 NOTE — Telephone Encounter (Signed)
Called Re: critical lab value for pt's platelets of 40. This is consistent with prior values as pt has chronic Tc-penia.

## 2013-03-09 NOTE — Assessment & Plan Note (Signed)
UA

## 2013-03-09 NOTE — Assessment & Plan Note (Signed)
Here for medicare wellness/physical  Diet: heart healthy  Physical activity: sedentary  Depression/mood screen: negative  Hearing: intact to whispered voice  Visual acuity: grossly normal, performs annual eye exam  ADLs: capable  Fall risk: none  Home safety: good  Cognitive evaluation: intact to orientation, naming, recall and repetition  EOL planning: adv directives, full code/ I agree  I have personally reviewed and have noted  1. The patient's medical and social history  2. Their use of alcohol, tobacco or illicit drugs  3. Their current medications and supplements  4. The patient's functional ability including ADL's, fall risks, home safety risks and hearing or visual impairment.  5. Diet and physical activities  6. Evidence for depression or mood disorders yes    Today patient counseled on age appropriate routine health concerns for screening and prevention, each reviewed and up to date or declined. Immunizations reviewed and up to date or declined. Labs ordered and reviewed. Risk factors for depression reviewed and negative. Hearing function and visual acuity are intact. ADLs screened and addressed as needed. Functional ability and level of safety reviewed and appropriate. Education, counseling and referrals performed based on assessed risks today. Patient provided with a copy of personalized plan for preventive services.

## 2013-03-10 ENCOUNTER — Other Ambulatory Visit: Payer: Self-pay | Admitting: Internal Medicine

## 2013-03-10 MED ORDER — CEFUROXIME AXETIL 250 MG PO TABS: 250.0000 mg | ORAL_TABLET | Freq: Two times a day (BID) | ORAL | Status: DC

## 2013-03-13 ENCOUNTER — Telehealth: Payer: Self-pay | Admitting: Internal Medicine

## 2013-03-13 NOTE — Telephone Encounter (Signed)
Someone call Alianys's house today.  Please call her back.

## 2013-03-14 NOTE — Telephone Encounter (Signed)
I called pt back on 03/13/13 and informed her of her labs.

## 2013-03-20 ENCOUNTER — Telehealth: Payer: Self-pay | Admitting: Internal Medicine

## 2013-03-20 NOTE — Telephone Encounter (Signed)
No need to ref ceftin Thx

## 2013-03-20 NOTE — Telephone Encounter (Signed)
Pt wants to know is she should refill the Ceftin.  She is also having leg cramps that keep her awake at night.

## 2013-03-21 NOTE — Telephone Encounter (Signed)
Pt is aware.  Also made an appt for June 19 for the leg pain.

## 2013-03-23 ENCOUNTER — Ambulatory Visit (INDEPENDENT_AMBULATORY_CARE_PROVIDER_SITE_OTHER): Payer: Medicare PPO | Admitting: Internal Medicine

## 2013-03-23 ENCOUNTER — Encounter: Payer: Self-pay | Admitting: Internal Medicine

## 2013-03-23 VITALS — BP 160/90 | HR 76 | Temp 98.0°F | Resp 16 | Wt 183.0 lb

## 2013-03-23 DIAGNOSIS — M797 Fibromyalgia: Secondary | ICD-10-CM

## 2013-03-23 DIAGNOSIS — R252 Cramp and spasm: Secondary | ICD-10-CM

## 2013-03-23 DIAGNOSIS — R609 Edema, unspecified: Secondary | ICD-10-CM

## 2013-03-23 DIAGNOSIS — IMO0001 Reserved for inherently not codable concepts without codable children: Secondary | ICD-10-CM

## 2013-03-23 DIAGNOSIS — E119 Type 2 diabetes mellitus without complications: Secondary | ICD-10-CM

## 2013-03-23 MED ORDER — BACLOFEN 10 MG PO TABS: 10.0000 mg | ORAL_TABLET | Freq: Three times a day (TID) | ORAL | Status: DC

## 2013-03-23 NOTE — Assessment & Plan Note (Addendum)
6/14 B R>L worse - poss due to meds. It is a complex situation  Take less Torsemide if more cramps Use Baclofen as needed for cramps Elevate feet  B doppler US if not better

## 2013-03-23 NOTE — Assessment & Plan Note (Signed)
Continue with current prescription therapy as reflected on the Med list.  

## 2013-03-23 NOTE — Patient Instructions (Addendum)
Take less Torsemide if more cramps Use Baclofen as needed for cramps Elevate feet

## 2013-03-23 NOTE — Assessment & Plan Note (Signed)
Chronic R>L - multifactorial Worse

## 2013-03-23 NOTE — Progress Notes (Signed)
   Subjective:    HPI  C/o B leg cramps R>L - worse... C/o swelling R>L leg...  The patient presents for a follow-up of chronic cirrhosis, hypertension, chronic dyslipidemia, type 2 diabetes controlled with medicines.     Wt Readings from Last 3 Encounters:  03/23/13 183 lb (83.008 kg)  03/09/13 180 lb (81.647 kg)  12/01/12 187 lb (84.823 kg)   BP Readings from Last 3 Encounters:  03/23/13 160/90  03/09/13 150/80  12/01/12 130/68        Review of Systems  Constitutional: Positive for fatigue. Negative for chills, activity change, appetite change and unexpected weight change.  HENT: Negative for mouth sores.   Eyes: Negative for visual disturbance.  Respiratory: Negative for chest tightness.   Genitourinary: Negative for frequency, difficulty urinating and vaginal pain.  Musculoskeletal: Positive for back pain, arthralgias and gait problem.  Skin: Negative for pallor and rash.  Neurological: Positive for dizziness, weakness and light-headedness. Negative for tremors.  Hematological: Negative for adenopathy. Bruises/bleeds easily.  Psychiatric/Behavioral: Positive for decreased concentration. Negative for suicidal ideas, confusion and sleep disturbance. The patient is nervous/anxious.        Objective:   Physical Exam  Constitutional: She appears well-developed. No distress.  obese  HENT:  Head: Normocephalic.  Right Ear: External ear normal.  Left Ear: External ear normal.  Nose: Nose normal.  Mouth/Throat: Oropharynx is clear and moist.  Eyes: Conjunctivae are normal. Pupils are equal, round, and reactive to light. Right eye exhibits no discharge. Left eye exhibits no discharge.  Neck: Normal range of motion. Neck supple. No JVD present. No tracheal deviation present. No thyromegaly present.  Cardiovascular: Normal rate, regular rhythm and normal heart sounds.   Pulmonary/Chest: No stridor. No respiratory distress. She has no wheezes.  Abdominal: Soft. Bowel  sounds are normal. She exhibits no distension and no mass. There is no tenderness. There is no rebound and no guarding.  Musculoskeletal: She exhibits edema (trace L; 1+ R) and tenderness (B knees: R>L calf).  Lymphadenopathy:    She has no cervical adenopathy.  Neurological: She displays normal reflexes. No cranial nerve deficit. She exhibits normal muscle tone. Coordination abnormal.  Skin: No rash noted. No erythema. No pallor.  Psychiatric: She has a normal mood and affect. Her behavior is normal. Judgment and thought content normal.   Tender neck, shoulders, LS  Lab Results  Component Value Date   WBC 2.8* 03/09/2013   HGB 13.0 03/09/2013   HCT 37.6 03/09/2013   PLT 40.0* 03/09/2013   GLUCOSE 129* 03/09/2013   CHOL 159 06/13/2009   TRIG 37.0 06/13/2009   HDL 66.50 06/13/2009   LDLCALC 85 06/13/2009   ALT 34 03/09/2013   AST 48* 03/09/2013   NA 138 03/09/2013   K 4.0 03/09/2013   CL 107 03/09/2013   CREATININE 0.7 03/09/2013   BUN 11 03/09/2013   CO2 23 03/09/2013   TSH 3.43 03/09/2013   INR 1.3* 03/09/2013   HGBA1C 10.6* 03/09/2013         Assessment & Plan:

## 2013-03-27 ENCOUNTER — Telehealth: Payer: Self-pay | Admitting: Internal Medicine

## 2013-03-27 NOTE — Telephone Encounter (Signed)
FYISteward Drone cancelled her July appt due to going out of town.  She states the Baclofen is working well for her.

## 2013-04-04 ENCOUNTER — Other Ambulatory Visit: Payer: Self-pay | Admitting: Internal Medicine

## 2013-04-17 NOTE — Assessment & Plan Note (Signed)
Continue with current prescription therapy as reflected on the Med list.  

## 2013-04-18 ENCOUNTER — Ambulatory Visit: Payer: Medicare PPO | Admitting: Internal Medicine

## 2013-05-10 ENCOUNTER — Other Ambulatory Visit: Payer: Self-pay | Admitting: Internal Medicine

## 2013-05-22 ENCOUNTER — Encounter: Payer: Self-pay | Admitting: Internal Medicine

## 2013-05-22 ENCOUNTER — Ambulatory Visit (INDEPENDENT_AMBULATORY_CARE_PROVIDER_SITE_OTHER): Payer: Medicare PPO | Admitting: Internal Medicine

## 2013-05-22 VITALS — BP 118/62 | HR 87 | Temp 99.4°F | Resp 12 | Wt 178.0 lb

## 2013-05-22 DIAGNOSIS — K7460 Unspecified cirrhosis of liver: Secondary | ICD-10-CM

## 2013-05-22 DIAGNOSIS — I10 Essential (primary) hypertension: Secondary | ICD-10-CM

## 2013-05-22 DIAGNOSIS — E119 Type 2 diabetes mellitus without complications: Secondary | ICD-10-CM

## 2013-05-22 DIAGNOSIS — R21 Rash and other nonspecific skin eruption: Secondary | ICD-10-CM | POA: Insufficient documentation

## 2013-05-22 DIAGNOSIS — R252 Cramp and spasm: Secondary | ICD-10-CM

## 2013-05-22 MED ORDER — TRIAMCINOLONE ACETONIDE 0.5 % EX CREA: TOPICAL_CREAM | Freq: Three times a day (TID) | CUTANEOUS | Status: DC

## 2013-05-22 MED ORDER — CEFUROXIME AXETIL 250 MG PO TABS: 250.0000 mg | ORAL_TABLET | Freq: Two times a day (BID) | ORAL | Status: DC

## 2013-05-22 NOTE — Assessment & Plan Note (Signed)
Continue with current prescription therapy as reflected on the Med list.  

## 2013-05-22 NOTE — Progress Notes (Signed)
   Subjective:    HPI  C/o R leg pain and a spot on the bck of R lower leg F/u B leg cramps R>L - worse... C/o swelling R>L leg...  The patient presents for a follow-up of chronic cirrhosis, hypertension, chronic dyslipidemia, type 2 diabetes controlled with medicines.     Wt Readings from Last 3 Encounters:  05/22/13 178 lb (80.74 kg)  03/23/13 183 lb (83.008 kg)  03/09/13 180 lb (81.647 kg)   BP Readings from Last 3 Encounters:  05/22/13 118/62  03/23/13 160/90  03/09/13 150/80        Review of Systems  Constitutional: Positive for fatigue. Negative for chills, activity change, appetite change and unexpected weight change.  HENT: Negative for mouth sores.   Eyes: Negative for visual disturbance.  Respiratory: Negative for chest tightness.   Genitourinary: Negative for frequency, difficulty urinating and vaginal pain.  Musculoskeletal: Positive for back pain, arthralgias and gait problem.  Skin: Negative for pallor and rash.  Neurological: Positive for dizziness, weakness and light-headedness. Negative for tremors.  Hematological: Negative for adenopathy. Bruises/bleeds easily.  Psychiatric/Behavioral: Positive for decreased concentration. Negative for suicidal ideas, confusion and sleep disturbance. The patient is nervous/anxious.        Objective:   Physical Exam  Constitutional: She appears well-developed. No distress.  obese  HENT:  Head: Normocephalic.  Right Ear: External ear normal.  Left Ear: External ear normal.  Nose: Nose normal.  Mouth/Throat: Oropharynx is clear and moist.  Eyes: Conjunctivae are normal. Pupils are equal, round, and reactive to light. Right eye exhibits no discharge. Left eye exhibits no discharge.  Neck: Normal range of motion. Neck supple. No JVD present. No tracheal deviation present. No thyromegaly present.  Cardiovascular: Normal rate, regular rhythm and normal heart sounds.   Pulmonary/Chest: No stridor. No respiratory  distress. She has no wheezes.  Abdominal: Soft. Bowel sounds are normal. She exhibits no distension and no mass. There is no tenderness. There is no rebound and no guarding.  Musculoskeletal: She exhibits edema (trace L; 1+ R) and tenderness (B knees: R>L calf).  Lymphadenopathy:    She has no cervical adenopathy.  Neurological: She displays normal reflexes. No cranial nerve deficit. She exhibits normal muscle tone. Coordination abnormal.  Skin: No rash noted. No erythema. No pallor.  Psychiatric: She has a normal mood and affect. Her behavior is normal. Judgment and thought content normal.   Tender neck, shoulders, LS  There is an eryth very tender post distal R shin indurated skin area 8x7 cm  Lab Results  Component Value Date   WBC 2.8* 03/09/2013   HGB 13.0 03/09/2013   HCT 37.6 03/09/2013   PLT 40.0* 03/09/2013   GLUCOSE 129* 03/09/2013   CHOL 159 06/13/2009   TRIG 37.0 06/13/2009   HDL 66.50 06/13/2009   LDLCALC 85 06/13/2009   ALT 34 03/09/2013   AST 48* 03/09/2013   NA 138 03/09/2013   K 4.0 03/09/2013   CL 107 03/09/2013   CREATININE 0.7 03/09/2013   BUN 11 03/09/2013   CO2 23 03/09/2013   TSH 3.43 03/09/2013   INR 1.3* 03/09/2013   HGBA1C 10.6* 03/09/2013         Assessment & Plan:

## 2013-05-22 NOTE — Assessment & Plan Note (Signed)
Better - gabapentin is helping

## 2013-05-22 NOTE — Assessment & Plan Note (Signed)
R post distal shin - cellulitis vs erythema nodosum vs other Ceftin Triamc cream

## 2013-06-08 ENCOUNTER — Ambulatory Visit: Payer: Medicare PPO | Admitting: Internal Medicine

## 2013-06-12 ENCOUNTER — Other Ambulatory Visit: Payer: Self-pay | Admitting: Internal Medicine

## 2013-06-13 ENCOUNTER — Encounter: Payer: Self-pay | Admitting: Internal Medicine

## 2013-06-13 ENCOUNTER — Ambulatory Visit (INDEPENDENT_AMBULATORY_CARE_PROVIDER_SITE_OTHER): Payer: Medicare PPO | Admitting: Internal Medicine

## 2013-06-13 VITALS — BP 150/80 | HR 94 | Temp 99.1°F | Wt 175.0 lb

## 2013-06-13 DIAGNOSIS — T148XXA Other injury of unspecified body region, initial encounter: Secondary | ICD-10-CM

## 2013-06-13 DIAGNOSIS — L02419 Cutaneous abscess of limb, unspecified: Secondary | ICD-10-CM

## 2013-06-13 DIAGNOSIS — L02415 Cutaneous abscess of right lower limb: Secondary | ICD-10-CM

## 2013-06-13 DIAGNOSIS — R609 Edema, unspecified: Secondary | ICD-10-CM

## 2013-06-13 MED ORDER — CEFTRIAXONE SODIUM 1 G IJ SOLR
1.0000 g | Freq: Once | INTRAMUSCULAR | Status: AC
  Administered 2013-06-13: 1 g via INTRAMUSCULAR

## 2013-06-13 MED ORDER — CEFUROXIME AXETIL 250 MG PO TABS: 250.0000 mg | ORAL_TABLET | Freq: Two times a day (BID) | ORAL | Status: DC

## 2013-06-13 MED ORDER — MUPIROCIN 2 % EX OINT: TOPICAL_OINTMENT | CUTANEOUS | Status: DC

## 2013-06-13 NOTE — Assessment & Plan Note (Signed)
9/14 s/p fall - soft tissue contusions

## 2013-06-13 NOTE — Progress Notes (Signed)
Subjective:    HPI C/o a fall on Friday C/o R leg pain and a spot on the side of R shin F/u B leg cramps R>L - worse... C/o swelling R>L leg...  The patient presents for a follow-up of chronic cirrhosis, hypertension, chronic dyslipidemia, type 2 diabetes controlled with medicines.     Wt Readings from Last 3 Encounters:  06/13/13 175 lb (79.379 kg)  05/22/13 178 lb (80.74 kg)  03/23/13 183 lb (83.008 kg)   BP Readings from Last 3 Encounters:  06/13/13 150/80  05/22/13 118/62  03/23/13 160/90        Review of Systems  Constitutional: Positive for fatigue. Negative for chills, activity change, appetite change and unexpected weight change.  HENT: Negative for mouth sores.   Eyes: Negative for visual disturbance.  Respiratory: Negative for chest tightness.   Genitourinary: Negative for frequency, difficulty urinating and vaginal pain.  Musculoskeletal: Positive for back pain, arthralgias and gait problem.  Skin: Negative for pallor and rash.  Neurological: Positive for dizziness, weakness and light-headedness. Negative for tremors.  Hematological: Negative for adenopathy. Bruises/bleeds easily.  Psychiatric/Behavioral: Positive for decreased concentration. Negative for suicidal ideas, confusion and sleep disturbance. The patient is nervous/anxious.        Objective:   Physical Exam  Constitutional: She appears well-developed. No distress.  obese  HENT:  Head: Normocephalic.  Right Ear: External ear normal.  Left Ear: External ear normal.  Nose: Nose normal.  Mouth/Throat: Oropharynx is clear and moist.  Eyes: Conjunctivae are normal. Pupils are equal, round, and reactive to light. Right eye exhibits no discharge. Left eye exhibits no discharge.  Neck: Normal range of motion. Neck supple. No JVD present. No tracheal deviation present. No thyromegaly present.  Cardiovascular: Normal rate, regular rhythm and normal heart sounds.   Pulmonary/Chest: No stridor. No  respiratory distress. She has no wheezes.  Abdominal: Soft. Bowel sounds are normal. She exhibits no distension and no mass. There is no tenderness. There is no rebound and no guarding.  Musculoskeletal: She exhibits edema (trace L; 1+ R) and tenderness (B knees: R>L calf).  Lymphadenopathy:    She has no cervical adenopathy.  Neurological: She displays normal reflexes. No cranial nerve deficit. She exhibits normal muscle tone. Coordination abnormal.  Skin: No rash noted. No erythema. No pallor.  Psychiatric: She has a normal mood and affect. Her behavior is normal. Judgment and thought content normal.   Tender neck, shoulders, LS  There is an eryth very tender lat mid R shin indurated skin area 3x3 cm w/a central ?abscess  Lab Results  Component Value Date   WBC 2.8* 03/09/2013   HGB 13.0 03/09/2013   HCT 37.6 03/09/2013   PLT 40.0* 03/09/2013   GLUCOSE 129* 03/09/2013   CHOL 159 06/13/2009   TRIG 37.0 06/13/2009   HDL 66.50 06/13/2009   LDLCALC 85 06/13/2009   ALT 34 03/09/2013   AST 48* 03/09/2013   NA 138 03/09/2013   K 4.0 03/09/2013   CL 107 03/09/2013   CREATININE 0.7 03/09/2013   BUN 11 03/09/2013   CO2 23 03/09/2013   TSH 3.43 03/09/2013   INR 1.3* 03/09/2013   HGBA1C 10.6* 03/09/2013    Procedure Note :    Procedure :   Point of care (POC) sonography examination   Indication: R lat shin swelling   Equipment used: Sonosite M-Turbo with HFL38x/13-6 MHz transducer linear probe. The images were stored in the unit and later transferred in storage.  The patient  was placed in a decubitus position.  This study revealed a hypoechoic with heteroechoic inclusions   Impression: lesion c/w abscess   Procedure note:  Incision and Drainage of an Abscess   Indication : a localized collection of pus that is tender and not spontaneously resolving.    Risks including unsuccessful procedure , possible need for a repeat procedure due to pus accumulation, scar formation, and others as well as benefits were  explained to the patient in detail. Written consent was obtained/signed.    The patient was placed in a decubitus position. The area of an abscess was prepped with povidone-iodine and draped in a sterile fashion. Local anesthesia with  2 cc of 2% lidocaine and epinephrine  was administered.  1 cm incision with #11strait blade was made. About 3-4 cc of purulent material was expressed. The abscess cavity was explored with a sterile hemostat and the walled- off pockets and septae were broken down bluntly. The cavity was irrigated with the rest of the anesthetic in the syringe and packed with 2 inches of  the iodoform gauze.   The wound was dressed with antibiotic ointment and Telfa pad.  Tolerated well. Complications: None.   Wound instructions provided.        Assessment & Plan:

## 2013-06-13 NOTE — Patient Instructions (Signed)

## 2013-06-13 NOTE — Assessment & Plan Note (Signed)
9/14 R lat shin See Procedure POC [place] Rocephin IM

## 2013-06-19 ENCOUNTER — Telehealth: Payer: Self-pay | Admitting: *Deleted

## 2013-06-19 NOTE — Telephone Encounter (Signed)
Pt called states she fell from the tub on Saturday.  Pt states she is experiencing pain from her head to her foot on the right side.  Further states she has right foot swelling.  Pt requests a return call from Dr Posey Nastasi.  Pt offered appoint on Wed at 8:15, pt declined appoint.  Please advise

## 2013-06-19 NOTE — Telephone Encounter (Signed)
[name] needs to go to ER. I could work her in tomorrow if ok. (I called at 5:32 pm - no answer, no VM) Thx

## 2013-06-20 ENCOUNTER — Telehealth: Payer: Self-pay | Admitting: *Deleted

## 2013-06-20 NOTE — Telephone Encounter (Signed)
Transferred pt to scheduling for appointment. 

## 2013-06-20 NOTE — Telephone Encounter (Signed)
Attempted to contact pt to advise of MDs message.  No answer and no VM.

## 2013-06-22 ENCOUNTER — Ambulatory Visit: Payer: Medicare PPO | Admitting: Internal Medicine

## 2013-06-23 ENCOUNTER — Other Ambulatory Visit: Payer: Self-pay | Admitting: Internal Medicine

## 2013-06-29 ENCOUNTER — Other Ambulatory Visit: Payer: Self-pay | Admitting: Internal Medicine

## 2013-06-29 ENCOUNTER — Ambulatory Visit: Payer: Medicare PPO | Admitting: Internal Medicine

## 2013-07-20 ENCOUNTER — Ambulatory Visit: Payer: Medicare PPO | Admitting: Internal Medicine

## 2013-07-27 ENCOUNTER — Other Ambulatory Visit (INDEPENDENT_AMBULATORY_CARE_PROVIDER_SITE_OTHER): Payer: Medicare PPO

## 2013-07-27 ENCOUNTER — Ambulatory Visit (INDEPENDENT_AMBULATORY_CARE_PROVIDER_SITE_OTHER): Payer: Medicare PPO | Admitting: Internal Medicine

## 2013-07-27 ENCOUNTER — Encounter: Payer: Self-pay | Admitting: Internal Medicine

## 2013-07-27 ENCOUNTER — Other Ambulatory Visit: Payer: Self-pay | Admitting: *Deleted

## 2013-07-27 VITALS — BP 138/90 | HR 80 | Temp 100.4°F | Resp 16 | Wt 173.0 lb

## 2013-07-27 DIAGNOSIS — L0201 Cutaneous abscess of face: Secondary | ICD-10-CM

## 2013-07-27 DIAGNOSIS — N390 Urinary tract infection, site not specified: Secondary | ICD-10-CM

## 2013-07-27 DIAGNOSIS — E119 Type 2 diabetes mellitus without complications: Secondary | ICD-10-CM

## 2013-07-27 LAB — URINALYSIS, ROUTINE W REFLEX MICROSCOPIC
Bilirubin Urine: NEGATIVE
Nitrite: NEGATIVE
Urine Glucose: >=1000
Urobilinogen, UA: 1 (ref 0.0–1.0)

## 2013-07-27 MED ORDER — CEFTRIAXONE SODIUM 500 MG IJ SOLR
500.0000 mg | Freq: Once | INTRAMUSCULAR | Status: AC
  Administered 2013-07-27: 500 mg via INTRAMUSCULAR

## 2013-07-27 MED ORDER — CEFUROXIME AXETIL 250 MG PO TABS: 500.0000 mg | ORAL_TABLET | Freq: Two times a day (BID) | ORAL | Status: DC

## 2013-07-27 NOTE — Patient Instructions (Signed)
Go to ER if worse 

## 2013-07-27 NOTE — Progress Notes (Signed)
Patient ID: [name], female   DOB: [birth year], 67 y.o.   MRN: [id number]   Subjective:    HPI C/o fever and L ear/face pain since last night  F/u B leg cramps R>L - worse... C/o swelling R>L leg...  The patient presents for a follow-up of chronic cirrhosis, hypertension, chronic dyslipidemia, type 2 diabetes controlled with medicines.     Wt Readings from Last 3 Encounters:  07/27/13 173 lb (78.472 kg)  06/13/13 175 lb (79.379 kg)  05/22/13 178 lb (80.74 kg)   BP Readings from Last 3 Encounters:  07/27/13 138/90  06/13/13 150/80  05/22/13 118/62        Review of Systems  Constitutional: Positive for fatigue. Negative for chills, activity change, appetite change and unexpected weight change.  HENT: Negative for mouth sores.   Eyes: Negative for visual disturbance.  Respiratory: Negative for chest tightness.   Genitourinary: Negative for frequency, difficulty urinating and vaginal pain.  Musculoskeletal: Positive for arthralgias, back pain and gait problem.  Skin: Negative for pallor and rash.  Neurological: Positive for dizziness, weakness and light-headedness. Negative for tremors.  Hematological: Negative for adenopathy. Bruises/bleeds easily.  Psychiatric/Behavioral: Positive for decreased concentration. Negative for suicidal ideas, confusion and sleep disturbance. The patient is nervous/anxious.        Objective:   Physical Exam  Constitutional: She appears well-developed. No distress.  obese  HENT:  Head: Normocephalic.  Right Ear: External ear normal.  Left Ear: External ear normal.  Nose: Nose normal.  Mouth/Throat: Oropharynx is clear and moist.  Eyes: Conjunctivae are normal. Pupils are equal, round, and reactive to light. Right eye exhibits no discharge. Left eye exhibits no discharge.  Neck: Normal range of motion. Neck supple. No JVD present. No tracheal deviation present. No thyromegaly present.  Cardiovascular: Normal rate, regular rhythm and  normal heart sounds.   Pulmonary/Chest: No stridor. No respiratory distress. She has no wheezes.  Abdominal: Soft. Bowel sounds are normal. She exhibits no distension and no mass. There is no tenderness. There is no rebound and no guarding.  Musculoskeletal: She exhibits edema (trace L; 1+ R) and tenderness (B knees: R>L calf).  Lymphadenopathy:    She has no cervical adenopathy.  Neurological: She displays normal reflexes. No cranial nerve deficit. She exhibits normal muscle tone. Coordination abnormal.  Skin: No rash noted. No erythema. No pallor.  Psychiatric: She has a normal mood and affect. Her behavior is normal. Judgment and thought content normal.  L ear and L cheek are swollen, tender and red  Lab Results  Component Value Date   WBC 2.8* 03/09/2013   HGB 13.0 03/09/2013   HCT 37.6 03/09/2013   PLT 40.0* 03/09/2013   GLUCOSE 129* 03/09/2013   CHOL 159 06/13/2009   TRIG 37.0 06/13/2009   HDL 66.50 06/13/2009   LDLCALC 85 06/13/2009   ALT 34 03/09/2013   AST 48* 03/09/2013   NA 138 03/09/2013   K 4.0 03/09/2013   CL 107 03/09/2013   CREATININE 0.7 03/09/2013   BUN 11 03/09/2013   CO2 23 03/09/2013   TSH 3.43 03/09/2013   INR 1.3* 03/09/2013   HGBA1C 10.6* 03/09/2013     Wound instructions provided.        Assessment & Plan:

## 2013-07-27 NOTE — Assessment & Plan Note (Signed)
10/14 L Rocephin 500 mg im Ceftin po RTC 1 wk

## 2013-07-29 ENCOUNTER — Other Ambulatory Visit: Payer: Self-pay | Admitting: Internal Medicine

## 2013-08-03 ENCOUNTER — Ambulatory Visit (INDEPENDENT_AMBULATORY_CARE_PROVIDER_SITE_OTHER): Payer: Medicare PPO | Admitting: Internal Medicine

## 2013-08-03 ENCOUNTER — Encounter: Payer: Self-pay | Admitting: Internal Medicine

## 2013-08-03 VITALS — BP 128/60 | HR 76 | Temp 98.0°F | Resp 16 | Wt 175.0 lb

## 2013-08-03 DIAGNOSIS — R252 Cramp and spasm: Secondary | ICD-10-CM

## 2013-08-03 DIAGNOSIS — N3289 Other specified disorders of bladder: Secondary | ICD-10-CM | POA: Insufficient documentation

## 2013-08-03 DIAGNOSIS — L0201 Cutaneous abscess of face: Secondary | ICD-10-CM

## 2013-08-03 DIAGNOSIS — E119 Type 2 diabetes mellitus without complications: Secondary | ICD-10-CM

## 2013-08-03 DIAGNOSIS — Z23 Encounter for immunization: Secondary | ICD-10-CM

## 2013-08-03 MED ORDER — MIRABEGRON ER 25 MG PO TB24: 25.0000 mg | ORAL_TABLET | Freq: Every day | ORAL | Status: DC

## 2013-08-03 NOTE — Assessment & Plan Note (Signed)
Better  

## 2013-08-03 NOTE — Assessment & Plan Note (Signed)
Resolving Finish abx

## 2013-08-03 NOTE — Assessment & Plan Note (Signed)
Improve CBG Trial Myrbetriq low dose w/caution

## 2013-08-03 NOTE — Assessment & Plan Note (Signed)
Wt Readings from Last 3 Encounters:  07/27/13 173 lb (78.472 kg)  06/13/13 175 lb (79.379 kg)  05/22/13 178 lb (80.74 kg)

## 2013-08-03 NOTE — Assessment & Plan Note (Signed)
Continue with current prescription therapy as reflected on the Med list. Labs  

## 2013-08-03 NOTE — Assessment & Plan Note (Signed)
Continue with current prescription therapy as reflected on the Med list.  

## 2013-08-03 NOTE — Progress Notes (Signed)
   Subjective:    HPI C/o urinary frequency x long time - needs med to help it  F/u fever and L ear/face pain since 10/22  F/u B leg cramps R>L - worse... C/o swelling R>L leg...  The patient presents for a follow-up of chronic cirrhosis, hypertension, chronic dyslipidemia, type 2 diabetes controlled with medicines.     Wt Readings from Last 3 Encounters:  08/03/13 175 lb (79.379 kg)  07/27/13 173 lb (78.472 kg)  06/13/13 175 lb (79.379 kg)   BP Readings from Last 3 Encounters:  08/03/13 128/60  07/27/13 138/90  06/13/13 150/80        Review of Systems  Constitutional: Positive for fatigue. Negative for chills, activity change, appetite change and unexpected weight change.  HENT: Negative for mouth sores.   Eyes: Negative for visual disturbance.  Respiratory: Negative for chest tightness.   Genitourinary: Negative for frequency, difficulty urinating and vaginal pain.  Musculoskeletal: Positive for arthralgias, back pain and gait problem.  Skin: Negative for pallor and rash.  Neurological: Positive for dizziness, weakness and light-headedness. Negative for tremors.  Hematological: Negative for adenopathy. Bruises/bleeds easily.  Psychiatric/Behavioral: Positive for decreased concentration. Negative for suicidal ideas, confusion and sleep disturbance. The patient is nervous/anxious.        Objective:   Physical Exam  Constitutional: She appears well-developed. No distress.  obese  HENT:  Head: Normocephalic.  Right Ear: External ear normal.  Left Ear: External ear normal.  Nose: Nose normal.  Mouth/Throat: Oropharynx is clear and moist.  Eyes: Conjunctivae are normal. Pupils are equal, round, and reactive to light. Right eye exhibits no discharge. Left eye exhibits no discharge.  Neck: Normal range of motion. Neck supple. No JVD present. No tracheal deviation present. No thyromegaly present.  Cardiovascular: Normal rate, regular rhythm and normal heart sounds.    Pulmonary/Chest: No stridor. No respiratory distress. She has no wheezes.  Abdominal: Soft. Bowel sounds are normal. She exhibits no distension and no mass. There is no tenderness. There is no rebound and no guarding.  Musculoskeletal: She exhibits edema (trace L; 1+ R) and tenderness (B knees: R>L calf).  Lymphadenopathy:    She has no cervical adenopathy.  Neurological: She displays normal reflexes. No cranial nerve deficit. She exhibits normal muscle tone. Coordination abnormal.  Skin: No rash noted. No erythema. No pallor.  Psychiatric: She has a normal mood and affect. Her behavior is normal. Judgment and thought content normal.  L ear and L cheek are not swollen, not tender and not red  Lab Results  Component Value Date   WBC 2.8* 03/09/2013   HGB 13.0 03/09/2013   HCT 37.6 03/09/2013   PLT 40.0* 03/09/2013   GLUCOSE 129* 03/09/2013   CHOL 159 06/13/2009   TRIG 37.0 06/13/2009   HDL 66.50 06/13/2009   LDLCALC 85 06/13/2009   ALT 34 03/09/2013   AST 48* 03/09/2013   NA 138 03/09/2013   K 4.0 03/09/2013   CL 107 03/09/2013   CREATININE 0.7 03/09/2013   BUN 11 03/09/2013   CO2 23 03/09/2013   TSH 3.43 03/09/2013   INR 1.3* 03/09/2013   HGBA1C 10.6* 03/09/2013          Assessment & Plan:

## 2013-08-30 ENCOUNTER — Other Ambulatory Visit: Payer: Self-pay | Admitting: Internal Medicine

## 2013-08-30 ENCOUNTER — Telehealth: Payer: Self-pay | Admitting: Internal Medicine

## 2013-08-30 ENCOUNTER — Encounter: Payer: Self-pay | Admitting: Podiatry

## 2013-08-30 NOTE — Telephone Encounter (Signed)
Pharmacy told her the insurance wouldn't pay for a refill on Myrbetriq.  She has 3 pills left.  Is there something cheaper that the insurance will pay for, or samples.

## 2013-08-31 NOTE — Telephone Encounter (Signed)
Is Detrol covered? If yes - ok 4 mg/d #90 and 3 ref Thx

## 2013-09-01 MED ORDER — TOLTERODINE TARTRATE ER 4 MG PO CP24: 4.0000 mg | ORAL_CAPSULE | Freq: Every day | ORAL | Status: DC

## 2013-09-01 NOTE — Telephone Encounter (Signed)
Detrol 4 mg Rx sent. Called pt- no answer.

## 2013-09-04 ENCOUNTER — Telehealth: Payer: Self-pay | Admitting: Internal Medicine

## 2013-09-04 NOTE — Telephone Encounter (Signed)
Requesting a refill on Benzonatate (Tessalon) 100mg .

## 2013-09-05 ENCOUNTER — Encounter: Payer: Self-pay | Admitting: Podiatry

## 2013-09-05 ENCOUNTER — Ambulatory Visit (INDEPENDENT_AMBULATORY_CARE_PROVIDER_SITE_OTHER): Payer: Medicare PPO | Admitting: Podiatry

## 2013-09-05 VITALS — BP 143/85 | HR 93 | Resp 16

## 2013-09-05 DIAGNOSIS — B351 Tinea unguium: Secondary | ICD-10-CM

## 2013-09-05 DIAGNOSIS — M79609 Pain in unspecified limb: Secondary | ICD-10-CM

## 2013-09-05 NOTE — Patient Instructions (Signed)
Diabetes and Foot Care Diabetes may cause you to have problems because of poor blood supply (circulation) to your feet and legs. This may cause the skin on your feet to become thinner, break easier, and heal more slowly. Your skin may become dry, and the skin may peel and crack. You may also have nerve damage in your legs and feet causing decreased feeling in them. You may not notice minor injuries to your feet that could lead to infections or more serious problems. Taking care of your feet is one of the most important things you can do for yourself.  HOME CARE INSTRUCTIONS  Wear shoes at all times, even in the house. Do not go barefoot. Bare feet are easily injured.  Check your feet daily for blisters, cuts, and redness. If you cannot see the bottom of your feet, use a mirror or ask someone for help.  Wash your feet with warm water (do not use hot water) and mild soap. Then pat your feet and the areas between your toes until they are completely dry. Do not soak your feet as this can dry your skin.  Apply a moisturizing lotion or petroleum jelly (that does not contain alcohol and is unscented) to the skin on your feet and to dry, brittle toenails. Do not apply lotion between your toes.  Trim your toenails straight across. Do not dig under them or around the cuticle. File the edges of your nails with an emery board or nail file.  Do not cut corns or calluses or try to remove them with medicine.  Wear clean socks or stockings every day. Make sure they are not too tight. Do not wear knee-high stockings since they may decrease blood flow to your legs.  Wear shoes that fit properly and have enough cushioning. To break in new shoes, wear them for just a few hours a day. This prevents you from injuring your feet. Always look in your shoes before you put them on to be sure there are no objects inside.  Do not cross your legs. This may decrease the blood flow to your feet.  If you find a minor scrape,  cut, or break in the skin on your feet, keep it and the skin around it clean and dry. These areas may be cleansed with mild soap and water. Do not cleanse the area with peroxide, alcohol, or iodine.  When you remove an adhesive bandage, be sure not to damage the skin around it.  If you have a wound, look at it several times a day to make sure it is healing.  Do not use heating pads or hot water bottles. They may burn your skin. If you have lost feeling in your feet or legs, you may not know it is happening until it is too late.  Make sure your health care provider performs a complete foot exam at least annually or more often if you have foot problems. Report any cuts, sores, or bruises to your health care provider immediately. SEEK MEDICAL CARE IF:   You have an injury that is not healing.  You have cuts or breaks in the skin.  You have an ingrown nail.  You notice redness on your legs or feet.  You feel burning or tingling in your legs or feet.  You have pain or cramps in your legs and feet.  Your legs or feet are numb.  Your feet always feel cold. SEEK IMMEDIATE MEDICAL CARE IF:   There is increasing redness,   swelling, or pain in or around a wound.  There is a red line that goes up your leg.  Pus is coming from a wound.  You develop a fever or as directed by your health care provider.  You notice a bad smell coming from an ulcer or wound. Document Released: 09/18/2000 Document Revised: 05/24/2013 Document Reviewed: 02/28/2013 [employer] Patient Information 2014 [employer].  

## 2013-09-05 NOTE — Progress Notes (Signed)
[name] presents today with a chief complaint of painful toenails and calluses bilateral foot.  Objective: Pulses remain palpable strong bilateral thick reactive hyperkeratosis to the plantar aspect sub-fifth metatarsophalangeal joint area of the right foot. Nails are thick yellow dystrophic onychomycotic and painful palpation as well as debridement.  Assessment: Painful reactive hyperkeratosis bilateral foot right greater than left. Pain in limb secondary to onychomycosis 1 through 5 bilateral.  Plan: Debridement of nails 1 through 5 bilateral is cover service associated with pain. Debridement of all reactive hyperkeratosis. Followup with her on a when necessary basis.

## 2013-09-06 MED ORDER — BENZONATATE 100 MG PO CAPS: 100.0000 mg | ORAL_CAPSULE | Freq: Three times a day (TID) | ORAL | Status: DC | PRN

## 2013-09-06 NOTE — Telephone Encounter (Signed)
OK to fill this prescription with additional refills x1 Thank you!  

## 2013-09-06 NOTE — Telephone Encounter (Signed)
Prescription sent to pharmacy.  Called the patient to inform no answer and no voice mail.

## 2013-09-14 ENCOUNTER — Ambulatory Visit: Payer: Medicare PPO | Admitting: Internal Medicine

## 2013-09-15 ENCOUNTER — Encounter: Payer: Self-pay | Admitting: Internal Medicine

## 2013-10-12 ENCOUNTER — Telehealth: Payer: Self-pay

## 2013-10-12 ENCOUNTER — Other Ambulatory Visit (INDEPENDENT_AMBULATORY_CARE_PROVIDER_SITE_OTHER): Payer: Medicare PPO

## 2013-10-12 ENCOUNTER — Ambulatory Visit (INDEPENDENT_AMBULATORY_CARE_PROVIDER_SITE_OTHER): Payer: Medicare PPO | Admitting: Internal Medicine

## 2013-10-12 ENCOUNTER — Encounter: Payer: Self-pay | Admitting: Internal Medicine

## 2013-10-12 VITALS — BP 136/84 | HR 90 | Temp 97.5°F | Wt 177.4 lb

## 2013-10-12 DIAGNOSIS — E119 Type 2 diabetes mellitus without complications: Secondary | ICD-10-CM

## 2013-10-12 DIAGNOSIS — M79672 Pain in left foot: Secondary | ICD-10-CM

## 2013-10-12 DIAGNOSIS — Z23 Encounter for immunization: Secondary | ICD-10-CM

## 2013-10-12 DIAGNOSIS — R7309 Other abnormal glucose: Secondary | ICD-10-CM

## 2013-10-12 DIAGNOSIS — M545 Low back pain, unspecified: Secondary | ICD-10-CM

## 2013-10-12 DIAGNOSIS — F3289 Other specified depressive episodes: Secondary | ICD-10-CM

## 2013-10-12 DIAGNOSIS — F329 Major depressive disorder, single episode, unspecified: Secondary | ICD-10-CM

## 2013-10-12 DIAGNOSIS — K7460 Unspecified cirrhosis of liver: Secondary | ICD-10-CM

## 2013-10-12 DIAGNOSIS — R739 Hyperglycemia, unspecified: Secondary | ICD-10-CM

## 2013-10-12 DIAGNOSIS — I10 Essential (primary) hypertension: Secondary | ICD-10-CM

## 2013-10-12 DIAGNOSIS — M79609 Pain in unspecified limb: Secondary | ICD-10-CM

## 2013-10-12 DIAGNOSIS — M79671 Pain in right foot: Secondary | ICD-10-CM

## 2013-10-12 LAB — BASIC METABOLIC PANEL
BUN: 12 mg/dL (ref 6–23)
CALCIUM: 8.9 mg/dL (ref 8.4–10.5)
CO2: 27 mEq/L (ref 19–32)
Chloride: 107 mEq/L (ref 96–112)
Creatinine, Ser: 0.9 mg/dL (ref 0.4–1.2)
GFR: 71.15 mL/min (ref >60.00)
GLUCOSE: 89 mg/dL (ref 70–99)
Potassium: 3.1 mEq/L — ABNORMAL LOW (ref 3.5–5.1)
Sodium: 140 mEq/L (ref 135–145)

## 2013-10-12 LAB — CBC WITH DIFFERENTIAL/PLATELET
BASOS ABS: 0 10*3/uL (ref 0.0–0.1)
Basophils Relative: 0.2 % (ref 0.0–3.0)
EOS PCT: 8.4 % — AB (ref 0.0–5.0)
Eosinophils Absolute: 0.3 10*3/uL (ref 0.0–0.7)
HEMATOCRIT: 34.8 % — AB (ref 36.0–46.0)
HEMOGLOBIN: 11.8 g/dL — AB (ref 12.0–15.0)
LYMPHS ABS: 0.9 10*3/uL (ref 0.7–4.0)
Lymphocytes Relative: 25.4 % (ref 12.0–46.0)
MCHC: 33.9 g/dL (ref 30.0–36.0)
MCV: 91.7 fl (ref 78.0–100.0)
Monocytes Absolute: 0.3 10*3/uL (ref 0.1–1.0)
Monocytes Relative: 9.1 % (ref 3.0–12.0)
Neutro Abs: 2.1 10*3/uL (ref 1.4–7.7)
Neutrophils Relative %: 56.9 % (ref 43.0–77.0)
Platelets: 42 10*3/uL — CL (ref 150.0–400.0)
RBC: 3.8 Mil/uL — ABNORMAL LOW (ref 3.87–5.11)
RDW: 17.7 % — AB (ref 11.5–14.6)
WBC: 3.7 10*3/uL — ABNORMAL LOW (ref 4.5–10.5)

## 2013-10-12 LAB — HEPATIC FUNCTION PANEL
ALT: 33 U/L (ref 0–35)
AST: 48 U/L — AB (ref 0–37)
Albumin: 2.4 g/dL — ABNORMAL LOW (ref 3.5–5.2)
Alkaline Phosphatase: 101 U/L (ref 39–117)
Bilirubin, Direct: 0.5 mg/dL — ABNORMAL HIGH (ref 0.0–0.3)
Total Bilirubin: 1.7 mg/dL — ABNORMAL HIGH (ref 0.3–1.2)
Total Protein: 6.9 g/dL (ref 6.0–8.3)

## 2013-10-12 LAB — HEMOGLOBIN A1C: HEMOGLOBIN A1C: 11 % — AB (ref 4.6–6.5)

## 2013-10-12 LAB — TSH: TSH: 3.19 u[IU]/mL (ref 0.35–5.50)

## 2013-10-12 LAB — AMMONIA: Ammonia: 31 umol/L (ref 11–35)

## 2013-10-12 NOTE — Assessment & Plan Note (Signed)
Continue with current prescription therapy as reflected on the Med list.  

## 2013-10-12 NOTE — Assessment & Plan Note (Signed)
Refused x rays

## 2013-10-12 NOTE — Progress Notes (Signed)
   Subjective:    HPI C/o R foot injury:  heater fell on it 1 wk ago  F/u fever and L ear/face pain since 10/22  F/u B leg cramps R>L - worse... C/o swelling R>L leg...  The patient presents for a follow-up of chronic cirrhosis, hypertension, chronic dyslipidemia, type 2 diabetes controlled with medicines.     Wt Readings from Last 3 Encounters:  10/12/13 177 lb 6 oz (80.457 kg)  08/03/13 175 lb (79.379 kg)  07/27/13 173 lb (78.472 kg)   BP Readings from Last 3 Encounters:  10/12/13 136/84  09/05/13 143/85  08/03/13 128/60        Review of Systems  Constitutional: Positive for fatigue. Negative for chills, activity change, appetite change and unexpected weight change.  HENT: Negative for mouth sores.   Eyes: Negative for visual disturbance.  Respiratory: Negative for chest tightness.   Genitourinary: Negative for frequency, difficulty urinating and vaginal pain.  Musculoskeletal: Positive for arthralgias, back pain and gait problem.  Skin: Negative for pallor and rash.  Neurological: Positive for dizziness, weakness and light-headedness. Negative for tremors.  Hematological: Negative for adenopathy. Bruises/bleeds easily.  Psychiatric/Behavioral: Positive for decreased concentration. Negative for suicidal ideas, confusion and sleep disturbance. The patient is nervous/anxious.        Objective:   Physical Exam  Constitutional: She appears well-developed. No distress.  obese  HENT:  Head: Normocephalic.  Right Ear: External ear normal.  Left Ear: External ear normal.  Nose: Nose normal.  Mouth/Throat: Oropharynx is clear and moist.  Eyes: Conjunctivae are normal. Pupils are equal, round, and reactive to light. Right eye exhibits no discharge. Left eye exhibits no discharge.  Neck: Normal range of motion. Neck supple. No JVD present. No tracheal deviation present. No thyromegaly present.  Cardiovascular: Normal rate, regular rhythm and normal heart sounds.    Pulmonary/Chest: No stridor. No respiratory distress. She has no wheezes.  Abdominal: Soft. Bowel sounds are normal. She exhibits no distension and no mass. There is no tenderness. There is no rebound and no guarding.  Musculoskeletal: She exhibits edema (trace L; 1+ R) and tenderness (B knees: R>L calf).  Lymphadenopathy:    She has no cervical adenopathy.  Neurological: She displays normal reflexes. No cranial nerve deficit. She exhibits normal muscle tone. Coordination abnormal.  Skin: No rash noted. No erythema. No pallor.  Psychiatric: She has a normal mood and affect. Her behavior is normal. Judgment and thought content normal.   R foot is swollen, tender and bruised  Lab Results  Component Value Date   WBC 3.7* 10/12/2013   HGB 11.8* 10/12/2013   HCT 34.8* 10/12/2013   PLT 42.0 Repeated and verified X2.* 10/12/2013   GLUCOSE 89 10/12/2013   CHOL 159 06/13/2009   TRIG 37.0 06/13/2009   HDL 66.50 06/13/2009   LDLCALC 85 06/13/2009   ALT 33 10/12/2013   AST 48* 10/12/2013   NA 140 10/12/2013   K 3.1* 10/12/2013   CL 107 10/12/2013   CREATININE 0.9 10/12/2013   BUN 12 10/12/2013   CO2 27 10/12/2013   TSH 3.19 10/12/2013   INR 1.3* 03/09/2013   HGBA1C 11.0* 10/12/2013          Assessment & Plan:

## 2013-10-12 NOTE — Assessment & Plan Note (Signed)
1/15 heater fell on 1 wk ago Refused X ray

## 2013-10-12 NOTE — Telephone Encounter (Signed)
[facility] called to report a critical plantlet count of 42. Thanks

## 2013-10-12 NOTE — Progress Notes (Signed)
Pre-visit discussion using our clinic review tool. No additional management support is needed unless otherwise documented below in the visit note.  

## 2013-10-13 ENCOUNTER — Other Ambulatory Visit: Payer: Self-pay | Admitting: Internal Medicine

## 2013-10-13 MED ORDER — POTASSIUM CHLORIDE ER 10 MEQ PO TBCR: 10.0000 meq | EXTENDED_RELEASE_TABLET | Freq: Every day | ORAL | Status: DC

## 2013-10-13 NOTE — Telephone Encounter (Signed)
Noted.  Stable. 

## 2013-10-14 ENCOUNTER — Other Ambulatory Visit: Payer: Self-pay | Admitting: Internal Medicine

## 2013-10-16 NOTE — Telephone Encounter (Signed)
Refill done.  

## 2013-10-25 ENCOUNTER — Encounter: Payer: Self-pay | Admitting: Internal Medicine

## 2013-10-25 ENCOUNTER — Ambulatory Visit (INDEPENDENT_AMBULATORY_CARE_PROVIDER_SITE_OTHER)
Admission: RE | Admit: 2013-10-25 | Discharge: 2013-10-25 | Disposition: A | Discharge status: Home / Self Care | Payer: Medicare PPO | Source: Ambulatory Visit | Attending: Internal Medicine | Admitting: Internal Medicine

## 2013-10-25 ENCOUNTER — Ambulatory Visit (INDEPENDENT_AMBULATORY_CARE_PROVIDER_SITE_OTHER): Payer: Medicare PPO | Admitting: Internal Medicine

## 2013-10-25 VITALS — BP 130/74 | HR 76 | Temp 97.4°F | Resp 16 | Wt 186.0 lb

## 2013-10-25 DIAGNOSIS — M79672 Pain in left foot: Secondary | ICD-10-CM

## 2013-10-25 DIAGNOSIS — M79671 Pain in right foot: Secondary | ICD-10-CM

## 2013-10-25 DIAGNOSIS — M79609 Pain in unspecified limb: Secondary | ICD-10-CM

## 2013-10-25 DIAGNOSIS — D696 Thrombocytopenia, unspecified: Secondary | ICD-10-CM

## 2013-10-25 DIAGNOSIS — E119 Type 2 diabetes mellitus without complications: Secondary | ICD-10-CM

## 2013-10-25 DIAGNOSIS — E162 Hypoglycemia, unspecified: Secondary | ICD-10-CM | POA: Insufficient documentation

## 2013-10-25 LAB — GLUCOSE, POCT (MANUAL RESULT ENTRY): POC Glucose: 68 mg/dl — AB (ref 70–99)

## 2013-10-25 NOTE — Progress Notes (Signed)
Pre visit review using our clinic review tool, if applicable. No additional management support is needed unless otherwise documented below in the visit note. 

## 2013-10-25 NOTE — Assessment & Plan Note (Addendum)
X ray R foot Cont Rx

## 2013-10-25 NOTE — Progress Notes (Signed)
   Subjective:    HPI  F/u R foot injury:  heater fell on it 1 month ago - not getting any better. C/o pain and swelling of th top of R foot, still bruised     F/u B leg cramps R>L ... C/o swelling R>L leg...not better  The patient presents for a follow-up of chronic cirrhosis, hypertension, chronic dyslipidemia, type 2 diabetes controlled with medicines.     Wt Readings from Last 3 Encounters:  10/25/13 186 lb (84.369 kg)  10/12/13 177 lb 6 oz (80.457 kg)  08/03/13 175 lb (79.379 kg)   BP Readings from Last 3 Encounters:  10/25/13 130/74  10/12/13 136/84  09/05/13 143/85        Review of Systems  Constitutional: Positive for fatigue. Negative for chills, activity change, appetite change and unexpected weight change.  HENT: Negative for mouth sores.   Eyes: Negative for visual disturbance.  Respiratory: Negative for chest tightness.   Genitourinary: Negative for frequency, difficulty urinating and vaginal pain.  Musculoskeletal: Positive for arthralgias, back pain and gait problem.  Skin: Negative for pallor and rash.  Neurological: Positive for dizziness, weakness and light-headedness. Negative for tremors.  Hematological: Negative for adenopathy. Bruises/bleeds easily.  Psychiatric/Behavioral: Positive for decreased concentration. Negative for suicidal ideas, confusion and sleep disturbance. The patient is nervous/anxious.        Objective:   Physical Exam  Constitutional: She appears well-developed. No distress.  obese  HENT:  Head: Normocephalic.  Right Ear: External ear normal.  Left Ear: External ear normal.  Nose: Nose normal.  Mouth/Throat: Oropharynx is clear and moist.  Eyes: Conjunctivae are normal. Pupils are equal, round, and reactive to light. Right eye exhibits no discharge. Left eye exhibits no discharge.  Neck: Normal range of motion. Neck supple. No JVD present. No tracheal deviation present. No thyromegaly present.  Cardiovascular: Normal  rate, regular rhythm and normal heart sounds.   Pulmonary/Chest: No stridor. No respiratory distress. She has no wheezes.  Abdominal: Soft. Bowel sounds are normal. She exhibits no distension and no mass. There is no tenderness. There is no rebound and no guarding.  Musculoskeletal: She exhibits edema (trace L; 1+ R) and tenderness (B knees: R>L calf).  Lymphadenopathy:    She has no cervical adenopathy.  Neurological: She displays normal reflexes. No cranial nerve deficit. She exhibits normal muscle tone. Coordination abnormal.  Skin: No rash noted. No erythema. No pallor.  Psychiatric: She has a normal mood and affect. Her behavior is normal. Judgment and thought content normal.   R foot is swollen, tender and bruised  Lab Results  Component Value Date   WBC 3.7* 10/12/2013   HGB 11.8* 10/12/2013   HCT 34.8* 10/12/2013   PLT 42.0 Repeated and verified X2.* 10/12/2013   GLUCOSE 89 10/12/2013   CHOL 159 06/13/2009   TRIG 37.0 06/13/2009   HDL 66.50 06/13/2009   LDLCALC 85 06/13/2009   ALT 33 10/12/2013   AST 48* 10/12/2013   NA 140 10/12/2013   K 3.1* 10/12/2013   CL 107 10/12/2013   CREATININE 0.9 10/12/2013   BUN 12 10/12/2013   CO2 27 10/12/2013   TSH 3.19 10/12/2013   INR 1.3* 03/09/2013   HGBA1C 11.0* 10/12/2013          Assessment & Plan:

## 2013-10-25 NOTE — Assessment & Plan Note (Signed)
Given orange juice

## 2013-10-26 ENCOUNTER — Encounter: Payer: Self-pay | Admitting: Internal Medicine

## 2013-10-26 ENCOUNTER — Telehealth: Payer: Self-pay | Admitting: Internal Medicine

## 2013-10-26 DIAGNOSIS — M25569 Pain in unspecified knee: Secondary | ICD-10-CM

## 2013-10-26 NOTE — Assessment & Plan Note (Signed)
Chronic - due to her liver cirrhosis

## 2013-10-26 NOTE — Assessment & Plan Note (Signed)
Resolved

## 2013-10-26 NOTE — Telephone Encounter (Signed)
Pt is aware of the x-ray results.  Dr. Pilar Plate Aluisio is the doctor who put in the metal plate.  She will need a [employer] referral to him.  She is concerned that she will bleed too much if she has surgery.

## 2013-10-26 NOTE — Assessment & Plan Note (Signed)
Continue with current prescription therapy as reflected on the Med list.  

## 2013-10-26 NOTE — Telephone Encounter (Signed)
I'll make a ref She will need to wear a "boot" most likely Thx

## 2013-10-26 NOTE — Telephone Encounter (Signed)
Ok Thx 

## 2013-10-26 NOTE — Telephone Encounter (Signed)
Need office note complete to fax to [facility] pt has appt 10/27/13  @ 315pm with Dr Verda Cumins

## 2013-10-29 ENCOUNTER — Emergency Department (HOSPITAL_COMMUNITY): Payer: Medicare PPO

## 2013-10-29 ENCOUNTER — Emergency Department (HOSPITAL_COMMUNITY)
Admission: EM | Admit: 2013-10-29 | Discharge: 2013-10-29 | Disposition: A | Discharge status: Home / Self Care | Payer: Medicare PPO | Attending: Family Medicine | Admitting: Family Medicine

## 2013-10-29 ENCOUNTER — Encounter (HOSPITAL_COMMUNITY): Payer: Self-pay | Admitting: Emergency Medicine

## 2013-10-29 DIAGNOSIS — M5030 Other cervical disc degeneration, unspecified cervical region: Secondary | ICD-10-CM | POA: Insufficient documentation

## 2013-10-29 DIAGNOSIS — E119 Type 2 diabetes mellitus without complications: Secondary | ICD-10-CM | POA: Insufficient documentation

## 2013-10-29 DIAGNOSIS — D61818 Other pancytopenia: Secondary | ICD-10-CM | POA: Insufficient documentation

## 2013-10-29 DIAGNOSIS — R0609 Other forms of dyspnea: Secondary | ICD-10-CM | POA: Insufficient documentation

## 2013-10-29 DIAGNOSIS — K5010 Crohn's disease of large intestine without complications: Secondary | ICD-10-CM | POA: Insufficient documentation

## 2013-10-29 DIAGNOSIS — F3289 Other specified depressive episodes: Secondary | ICD-10-CM | POA: Insufficient documentation

## 2013-10-29 DIAGNOSIS — Z79899 Other long term (current) drug therapy: Secondary | ICD-10-CM | POA: Insufficient documentation

## 2013-10-29 DIAGNOSIS — R079 Chest pain, unspecified: Secondary | ICD-10-CM

## 2013-10-29 DIAGNOSIS — Z8711 Personal history of peptic ulcer disease: Secondary | ICD-10-CM | POA: Insufficient documentation

## 2013-10-29 DIAGNOSIS — Z794 Long term (current) use of insulin: Secondary | ICD-10-CM | POA: Insufficient documentation

## 2013-10-29 DIAGNOSIS — R0989 Other specified symptoms and signs involving the circulatory and respiratory systems: Secondary | ICD-10-CM | POA: Insufficient documentation

## 2013-10-29 DIAGNOSIS — K7460 Unspecified cirrhosis of liver: Secondary | ICD-10-CM

## 2013-10-29 DIAGNOSIS — F329 Major depressive disorder, single episode, unspecified: Secondary | ICD-10-CM | POA: Insufficient documentation

## 2013-10-29 DIAGNOSIS — I10 Essential (primary) hypertension: Secondary | ICD-10-CM | POA: Insufficient documentation

## 2013-10-29 DIAGNOSIS — R0600 Dyspnea, unspecified: Secondary | ICD-10-CM

## 2013-10-29 LAB — CBC WITH DIFFERENTIAL/PLATELET
BASOS ABS: 0 10*3/uL (ref 0.0–0.1)
Basophils Relative: 0 % (ref 0–1)
EOS PCT: 9 % — AB (ref 0–5)
Eosinophils Absolute: 0.2 10*3/uL (ref 0.0–0.7)
HCT: 33.2 % — ABNORMAL LOW (ref 36.0–46.0)
Hemoglobin: 11.4 g/dL — ABNORMAL LOW (ref 12.0–15.0)
LYMPHS ABS: 0.7 10*3/uL (ref 0.7–4.0)
LYMPHS PCT: 33 % (ref 12–46)
MCH: 30.6 pg (ref 26.0–34.0)
MCHC: 34.3 g/dL (ref 30.0–36.0)
MCV: 89.2 fL (ref 78.0–100.0)
MONOS PCT: 9 % (ref 3–12)
Monocytes Absolute: 0.2 10*3/uL (ref 0.1–1.0)
NEUTROS PCT: 49 % (ref 43–77)
Neutro Abs: 0.9 10*3/uL — ABNORMAL LOW (ref 1.7–7.7)
Platelets: 36 10*3/uL — ABNORMAL LOW (ref 150–400)
RBC: 3.72 MIL/uL — AB (ref 3.87–5.11)
RDW: 16.6 % — ABNORMAL HIGH (ref 11.5–15.5)
WBC: 2 10*3/uL — ABNORMAL LOW (ref 4.0–10.5)

## 2013-10-29 LAB — COMPREHENSIVE METABOLIC PANEL
ALT: 27 U/L (ref 0–35)
AST: 43 U/L — AB (ref 0–37)
Albumin: 2.2 g/dL — ABNORMAL LOW (ref 3.5–5.2)
Alkaline Phosphatase: 116 U/L (ref 39–117)
BUN: 13 mg/dL (ref 6–23)
CALCIUM: 8.4 mg/dL (ref 8.4–10.5)
CO2: 23 meq/L (ref 19–32)
Chloride: 102 mEq/L (ref 96–112)
Creatinine, Ser: 0.88 mg/dL (ref 0.50–1.10)
GFR calc Af Amer: 78 mL/min — ABNORMAL LOW (ref >90)
GFR, EST NON AFRICAN AMERICAN: 67 mL/min — AB (ref >90)
GLUCOSE: 356 mg/dL — AB (ref 70–99)
POTASSIUM: 3 meq/L — AB (ref 3.7–5.3)
SODIUM: 137 meq/L (ref 137–147)
Total Bilirubin: 1.5 mg/dL — ABNORMAL HIGH (ref 0.3–1.2)
Total Protein: 7.1 g/dL (ref 6.0–8.3)

## 2013-10-29 LAB — PRO B NATRIURETIC PEPTIDE: Pro B Natriuretic peptide (BNP): 200.8 pg/mL — ABNORMAL HIGH (ref 0–125)

## 2013-10-29 LAB — POCT I-STAT TROPONIN I: TROPONIN I, POC: 0.01 ng/mL (ref 0.00–0.08)

## 2013-10-29 LAB — PROTIME-INR
INR: 1.43 (ref 0.00–1.49)
Prothrombin Time: 17.1 seconds — ABNORMAL HIGH (ref 11.6–15.2)

## 2013-10-29 LAB — D-DIMER, QUANTITATIVE: D-Dimer, Quant: 2 ug/mL-FEU — ABNORMAL HIGH (ref 0.00–0.48)

## 2013-10-29 MED ORDER — POTASSIUM CHLORIDE 10 MEQ/100ML IV SOLN
10.0000 meq | Freq: Once | INTRAVENOUS | Status: AC
Start: 2013-10-29 — End: 2013-10-29
  Administered 2013-10-29: 10 meq via INTRAVENOUS
  Filled 2013-10-29: qty 100

## 2013-10-29 MED ORDER — MORPHINE SULFATE 4 MG/ML IJ SOLN
4.0000 mg | Freq: Once | INTRAMUSCULAR | Status: AC
  Administered 2013-10-29: 4 mg via INTRAVENOUS
  Filled 2013-10-29: qty 1

## 2013-10-29 MED ORDER — ASPIRIN 325 MG PO TABS
325.0000 mg | ORAL_TABLET | ORAL | Status: AC
  Administered 2013-10-29: 325 mg via ORAL
  Filled 2013-10-29: qty 1

## 2013-10-29 MED ORDER — IOHEXOL 350 MG/ML SOLN
100.0000 mL | Freq: Once | INTRAVENOUS | Status: AC | PRN
  Administered 2013-10-29: 88 mL via INTRAVENOUS

## 2013-10-29 MED ORDER — NITROGLYCERIN 0.4 MG SL SUBL
0.4000 mg | SUBLINGUAL_TABLET | SUBLINGUAL | Status: DC | PRN
  Administered 2013-10-29 (×2): 0.4 mg via SUBLINGUAL
  Filled 2013-10-29: qty 25

## 2013-10-29 MED ORDER — POTASSIUM CHLORIDE CRYS ER 20 MEQ PO TBCR
40.0000 meq | EXTENDED_RELEASE_TABLET | Freq: Once | ORAL | Status: DC
  Filled 2013-10-29: qty 2

## 2013-10-29 NOTE — ED Notes (Signed)
Returned from ct 

## 2013-10-29 NOTE — ED Notes (Signed)
Patient transported to CT 

## 2013-10-29 NOTE — ED Notes (Signed)
Assisted to the rest room to void tolerated well back to stretcher and placed back on monitors.

## 2013-10-29 NOTE — Discharge Instructions (Signed)

## 2013-10-29 NOTE — ED Provider Notes (Signed)
States over the last 6 months or so has had several episodes once every couple weeks or so of this same vague chest pain and mild shortness breath syndrome which lasts typically a couple hours then resolves she may have have them either at rest or when walking but has not told her doctor about this in the past.[facility] paged. [phone]  HEART score 4 [facility] will see Pt in ED to consider Obs.  [facility] saw Pt and doubts ACS will not Obs feeling musculoskeletal etiology; recs discharge f/u PCP to consider OutPt cardiac evaluation. Gibson, MD 10/29/13 2132

## 2013-10-29 NOTE — ED Provider Notes (Signed)
CSN: [id number]     Arrival date & time 10/29/13  0557 History   First MD Initiated Contact with Patient 10/29/13 [phone]     Chief Complaint  Patient presents with  . Chest Pain   (Consider location/radiation/quality/duration/timing/severity/associated sxs/prior Treatment) Patient is a 68 y.o. female presenting with chest pain. The history is provided by the patient.  Chest Pain She had onset last night of chest pain which is described as a heavy feeling in the anterior chest with radiation to the back. She rates this pain a 10/10. Nothing makes it better nothing makes it worse. There is associated dyspnea but no nausea or diaphoresis. She has not done anything to try to help the pain. She denies cough, fever, chills, sweats. She does have a history of hypertension diabetes and also cirrhosis.  Past Medical History  Diagnosis Date  . Personal history of peptic ulcer disease   . Ventral hernia, unspecified, without mention of obstruction or gangrene   . Personal history of diseases of blood and blood-forming organs   . Degeneration of cervical intervertebral disc   . Regional enteritis of large intestine   . Unspecified constipation   . Unspecified intestinal obstruction   . Routine general medical examination at a health care facility   . Rosacea   . Cirrhosis of liver without mention of alcohol   . Type II or unspecified type diabetes mellitus without mention of complication, not stated as uncontrolled   . Unspecified essential hypertension   . Subarachnoid hemorrhage   . Depressive disorder, not elsewhere classified   . Iron deficiency anemia, unspecified   . Lumbago   . Unspecified hearing loss   . Urinary tract infection, site not specified   . Morbid obesity   . Personal history of unspecified digestive disease   . Personal history of urinary calculi   . Family history of diabetes mellitus    Past Surgical History  Procedure Laterality Date  . Cholecystectomy    . Abdominal  hysterectomy    . Partial colectomy    . Breast cyst excision    . Tibia fracture surgery      right  . Liver biopsy     Family History  Problem Relation Age of Onset  . Diabetes Other   . Clotting disorder Other   . Colitis Other   . Crohn's disease Other   . Heart disease Sister    History  Substance Use Topics  . Smoking status: Never Smoker   . Smokeless tobacco: Not on file  . Alcohol Use: No   OB History   Grav Para Term Preterm Abortions TAB SAB Ect Mult Living                 Review of Systems  Cardiovascular: Positive for chest pain.  All other systems reviewed and are negative.    Allergies  Amoxicillin-pot clavulanate; Azithromycin; Ciprofloxacin; Duragesic disc transdermal system; Metformin; Sulfamethoxazole-trimethoprim; Tetracycline hcl; and Tramadol  Home Medications   Current Outpatient Rx  Name  Route  Sig  Dispense  Refill  . ACCU-CHEK COMPACT TEST DRUM test strip      USE 4 TIMES A DAY AS DIRECTED   200 each   1   . baclofen (LIORESAL) 10 MG tablet   Oral   Take 1 tablet (10 mg total) by mouth 3 (three) times daily.   90 each   3   . benzonatate (TESSALON) 100 MG capsule      TAKE ONE  CAPSULE BY MOUTH 3 TIMES A DAY AS NEEDED FOR COUGH   120 capsule   0   . benzonatate (TESSALON) 100 MG capsule   Oral   Take 1 capsule (100 mg total) by mouth 3 (three) times daily as needed for cough.   120 capsule   1   . buPROPion (WELLBUTRIN SR) 150 MG 12 hr tablet      TAKE 1 TABLET BY MOUTH EVERY MORNING   30 tablet   5   . Cholecalciferol (VITAMIN D3) 1000 UNITS tablet   Oral   Take 1,000 Units by mouth daily.           . citalopram (CELEXA) 10 MG tablet      TAKE 1 TABLET BY MOUTH ONCE DAILY   90 tablet   1   . Colesevelam HCl (WELCHOL) 3.75 G PACK      1 pack qd prn diarrhea   30 each   1   . fesoterodine (TOVIAZ) 4 MG TB24   Oral   Take 1 tablet (4 mg total) by mouth daily.   30 tablet   11   . gabapentin  (NEURONTIN) 800 MG tablet      TAKE 1 TABLET BY MOUTH 3 TIMES A DAY AS NEEDED   90 tablet   1   . EXPIRED: glimepiride (AMARYL) 4 MG tablet   Oral   Take 4 mg by mouth 2 (two) times daily.         Marland Kitchen glucose blood (FREESTYLE LITE) test strip      Use as directed to check blood sugar twice daily dx 250.00   100 each   3   . HUMALOG KWIKPEN 100 UNIT/ML SOPN      INJECT 20 UNITS SUBCUTANEOUSLY 4 TIMES A DAY--BEFORE MEALS AND AT BEDTIME. AS DISCUSSED WITH DR   30 pen   3   . HUMALOG KWIKPEN 100 UNIT/ML SOPN      INJECT 20 UNITS SUBCUTANEOUSLY 4 TIMES A DAY--BEFORE MEALS AND AT BEDTIME. AS DISCUSSED WITH DR   30 pen   3   . insulin lispro (HUMALOG KWIKPEN) 100 UNIT/ML injection   Subcutaneous   Inject 20 Units into the skin 4 (four) times daily -  before meals and at bedtime.   15 mL   11   . Insulin Pen Needle (B-D UF III MINI PEN NEEDLES) 31G X 5 MM MISC      as directed.           . lactulose (CHRONULAC) 10 GM/15ML solution      TAKE 30ML'S BY MOUTH TWO TO THREE TIMES A WEEK   473 mL   1   . Lancets (FREESTYLE) lancets   Other   1 each by Other route 2 (two) times daily as needed. Use as instructed          . mupirocin ointment (BACTROBAN) 2 %      Use bid w/dressing change   30 g   0   . potassium chloride (KLOR-CON 10) 10 MEQ tablet   Oral   Take 1 tablet (10 mEq total) by mouth daily.   20 tablet   0   . Probiotic Product (ALIGN) 4 MG CAPS   Oral   Take 1 capsule by mouth daily.           Marland Kitchen spironolactone (ALDACTONE) 100 MG tablet   Oral   Take 100 mg by mouth daily.           Marland Kitchen  tolterodine (DETROL LA) 4 MG 24 hr capsule   Oral   Take 1 capsule (4 mg total) by mouth daily.   90 capsule   3   . torsemide (DEMADEX) 20 MG tablet      TAKE 2 TABLETS BY MOUTH DAILY AS NEEDED FOR SWELLING   60 tablet   3   . traMADol (ULTRAM) 50 MG tablet      TAKE 1 TABLET BY MOUTH EVERY 8 HOURS AS NEEDED FOR PAIN   90 tablet   3   .  triamcinolone cream (KENALOG) 0.5 %   Topical   Apply topically 3 (three) times daily. To the rash on R leg   45 g   0   . VOLTAREN 1 % GEL   Topical   Apply 1 application topically 4 (four) times daily.   100 g   3     Dispense as written.    BP 131/69  Temp(Src) 98.4 F (36.9 C) (Oral)  Resp 15  Ht 5\' 3"  (1.6 m)  Wt 180 lb (81.647 kg)  BMI 31.89 kg/m2  SpO2 95% Physical Exam  Nursing note and vitals reviewed.  68 year old female, resting comfortably and in no acute distress. Vital signs are normal. Oxygen saturation is 95%, which is normal. Head is normocephalic and atraumatic. PERRLA, EOMI. Oropharynx is clear. Neck is nontender and supple without adenopathy. JVD is present. Back is nontender and there is no CVA tenderness. There is 1+ presacral edema. Lungs are clear without rales, wheezes, or rhonchi. Chest is nontender. Heart has regular rate and rhythm without murmur. Abdomen is soft, flat, nontender without masses or hepatosplenomegaly and peristalsis is normoactive. Extremities have 2-3+ edema. Right foot and ankle area are discolored and the patient relates this to an object having fallen on her foot. This has been evaluated by her PCP he x-rays which showed a fracture of a plate that had been used for internal fixation, but no fracture the underlying bone.. Skin is warm and dry without rash. Neurologic: Mental status is normal, cranial nerves are intact, there are no motor or sensory deficits.  ED Course  Procedures (including critical care time) Labs Review Results for orders placed during the hospital encounter of 10/29/13  CBC WITH DIFFERENTIAL      Result Value Range   WBC 2.0 (*) 4.0 - 10.5 K/uL   RBC 3.72 (*) 3.87 - 5.11 MIL/uL   Hemoglobin 11.4 (*) 12.0 - 15.0 g/dL   HCT 33.2 (*) 36.0 - 46.0 %   MCV 89.2  78.0 - 100.0 fL   MCH 30.6  26.0 - 34.0 pg   MCHC 34.3  30.0 - 36.0 g/dL   RDW 16.6 (*) 11.5 - 15.5 %   Platelets 36 (*) 150 - 400 K/uL    Neutrophils Relative % PENDING  43 - 77 %   Neutro Abs PENDING  1.7 - 7.7 K/uL   Band Neutrophils PENDING  0 - 10 %   Lymphocytes Relative PENDING  12 - 46 %   Lymphs Abs PENDING  0.7 - 4.0 K/uL   Monocytes Relative PENDING  3 - 12 %   Monocytes Absolute PENDING  0.1 - 1.0 K/uL   Eosinophils Relative PENDING  0 - 5 %   Eosinophils Absolute PENDING  0.0 - 0.7 K/uL   Basophils Relative PENDING  0 - 1 %   Basophils Absolute PENDING  0.0 - 0.1 K/uL   WBC Morphology PENDING  RBC Morphology PENDING     Smear Review PENDING     nRBC PENDING  0 /100 WBC   Metamyelocytes Relative PENDING     Myelocytes PENDING     Promyelocytes Absolute PENDING     Blasts PENDING    COMPREHENSIVE METABOLIC PANEL      Result Value Range   Sodium 137  137 - 147 mEq/L   Potassium 3.0 (*) 3.7 - 5.3 mEq/L   Chloride 102  96 - 112 mEq/L   CO2 23  19 - 32 mEq/L   Glucose, Bld 356 (*) 70 - 99 mg/dL   BUN 13  6 - 23 mg/dL   Creatinine, Ser 6.30  0.50 - 1.10 mg/dL   Calcium 8.4  8.4 - 16.0 mg/dL   Total Protein 7.1  6.0 - 8.3 g/dL   Albumin 2.2 (*) 3.5 - 5.2 g/dL   AST 43 (*) 0 - 37 U/L   ALT 27  0 - 35 U/L   Alkaline Phosphatase 116  39 - 117 U/L   Total Bilirubin 1.5 (*) 0.3 - 1.2 mg/dL   GFR calc non Af Amer 67 (*) >90 mL/min   GFR calc Af Amer 78 (*) >90 mL/min  D-DIMER, QUANTITATIVE      Result Value Range   D-Dimer, Quant 2.00 (*) 0.00 - 0.48 ug/mL-FEU  PRO B NATRIURETIC PEPTIDE      Result Value Range   Pro B Natriuretic peptide (BNP) 200.8 (*) 0 - 125 pg/mL  POCT I-STAT TROPONIN I      Result Value Range   Troponin i, poc 0.01  0.00 - 0.08 ng/mL   Comment 3            Imaging Review Dg Chest 2 View  10/29/2013   CLINICAL DATA:  Left-sided chest pain rate could neck. Diabetes and hypertension.  EXAM: CHEST  2 VIEW  COMPARISON:  DG CHEST 2 VIEW dated 06/24/2010  FINDINGS: The heart size and mediastinal contours are within normal limits. Both lungs are clear. The visualized skeletal structures  are unremarkable.  IMPRESSION: No active cardiopulmonary disease.   Electronically Signed   By: Myles Rosenthal M.D.   On: 10/29/2013 07:23    EKG Interpretation    Date/Time:  Sunday October 29 2013 06:03:31 EST Ventricular Rate:  100 PR Interval:  175 QRS Duration: 72 QT Interval:  362 QTC Calculation: 467 R Axis:   13 Text Interpretation:  Age not entered, assumed to be  68 years old for purpose of ECG interpretation Sinus tachycardia Anterior infarct, old Artifact Low voltage QRS Nonspecific T wave abnormality When compared with ECG of 04/17/2007, Nonspecific T wave abnormality is now Present Confirmed by [facility]  MD, Tyrique Sporn ([id number]) on 10/29/2013 6:10:46 AM            MDM   1. Chest pain   2. Dyspnea   3. Cirrhosis of liver   4. Pancytopenia    Chest pain and dyspnea of uncertain cause. She is given aspirin and will be given nitroglycerin to see if it gives her any relief. Screening labs have been ordered. Old records are reviewed and she has no history of coronary artery disease documented.  Chest pain did resolve with nitroglycerin but she continues to drive he is having difficulty breathing. BNP is only minimally elevated and probably not clinically significant. D-dimer is come back elevated and she is sent for CT angiogram to rule out pulmonary embolism. She will need to be admitted regardless  of this result. Case is signed out to Dr. Stevie Kern to evaluate results of CT angiogram and arrange hospital admission.  Delora Fuel, MD 62/26/33 3545

## 2013-10-29 NOTE — Consult Note (Signed)
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 She states when I clarify with her that she is having back and chest pain. The majority of her pain is in her thoracic spine. She states that this is a reproducible pain when pressure is placed over that area she also has a tender spot in the center of her chest at the costosternal angle. She has also tenderness in bilateral shoulders when she does range of motion exercises and moves her hand the hand her head with abduction and internal rotation and also when she reaches into her contralateral back pocket. She states that gabapentin 2 tablets in the morning and one tablet in the evening seemed to help her chronic pains. Initially she apparently had told the emergency room physician that she was having chest pain D-dimer was 2.00 and CT of the chest was negative for pulmonary embolus Chest x-ray shows no visualized bony abnormalities in the chest Point-of-care troponin was negative EKG shows sinus rhythm PR interval 0.20, normal axis 15, no ST-T wave inversions or anything concerning. Although she has a family history of premature heart disease with her father dying at age 69, as well as personal history of diabetes and cholesterol, her physical findings as well as consolation of lab and imaging data and EKG are not convincing for coronary  artery disease. Calculated Heart score  is about 3-4, and I do think that she should have an outpatient appointment with the cardiologist , however I would be more inclined to treat her pain aggressively with gabapentin 4 times a day 800 mg. I have discussed the reproducible nature of her chest pain with the ED physician as well as with the patient and I think she can have an outpatient workup if needed  Questions were all answered    thank you for this consult I will copy of this note to her primary care physician Dr. Alain Marion to coordinate further care    Review of Systems:   denies shortness of breath denies blurred vision denies double vision denies fever denies chills denies nausea denies vomiting denies rash denied sick contacts denies any other issues   Past Medical History  Diagnosis Date  . Personal history of peptic ulcer disease   . Ventral hernia, unspecified, without mention of obstruction or gangrene   . Personal history of diseases of blood and blood-forming organs   . Degeneration of cervical intervertebral disc   . Regional enteritis of large intestine   . Unspecified constipation   . Unspecified intestinal obstruction   . Routine general medical examination at a health care facility   . Rosacea   . Cirrhosis of liver without mention of alcohol   . Type II or unspecified type diabetes mellitus without mention of complication, not stated as uncontrolled   . Unspecified essential hypertension   . Subarachnoid hemorrhage   . Depressive disorder, not elsewhere classified   . Iron deficiency anemia, unspecified   . Lumbago   . Unspecified hearing loss   .  Urinary tract infection, site not specified   . Morbid obesity   . Personal history of unspecified digestive disease   . Personal history of urinary calculi   . Family history of diabetes mellitus    Past Surgical History  Procedure Laterality Date  . Cholecystectomy    . Abdominal hysterectomy    . Partial  colectomy    . Breast cyst excision    . Tibia fracture surgery      right  . Liver biopsy     Social History:  reports that she has never smoked. She does not have any smokeless tobacco history on file. She reports that she does not drink alcohol or use illicit drugs.  Allergies  Allergen Reactions  . Amoxicillin-Pot Clavulanate Diarrhea    REACTION: diarrhea  . Ciprofloxacin Other (See Comments)    REACTION: swelling of LE  . Duragesic Disc Transdermal System [Alcohol-Fentanyl] Itching    Itching from patch  . Metformin Diarrhea  . Sulfamethoxazole-Trimethoprim Swelling    REACTION: leg edema  . Tetracycline Hcl Itching  . Tramadol Other (See Comments)    Headache  . Azithromycin Rash   Family History  Problem Relation Age of Onset  . Diabetes Other   . Clotting disorder Other   . Colitis Other   . Crohn's disease Other   . Heart disease Sister     Prior to Admission medications   Medication Sig Start Date End Date Taking? Authorizing Provider  Cholecalciferol (VITAMIN D3) 1000 UNITS tablet Take 1,000 Units by mouth daily.     Yes Historical Provider, MD  fesoterodine (TOVIAZ) 4 MG TB24 Take 1 tablet (4 mg total) by mouth daily. 08/01/12  Yes Evie Lacks Plotnikov, MD  gabapentin (NEURONTIN) 800 MG tablet Take 800 mg by mouth 3 (three) times daily as needed (leg pain).   Yes Historical Provider, MD  HUMALOG KWIKPEN 100 UNIT/ML SOPN INJECT 20 UNITS SUBCUTANEOUSLY 4 TIMES A DAY--BEFORE MEALS AND AT BEDTIME. AS DISCUSSED WITH DR 06/29/13  Yes Evie Lacks Plotnikov, MD  lactulose (CHRONULAC) 10 GM/15ML solution TAKE 30ML'S BY MOUTH TWO TO THREE TIMES A WEEK 11/07/12  Yes Cassandria Anger, MD  mupirocin ointment (BACTROBAN) 2 % Place 1 application into the nose 2 (two) times daily. With dressing  change   Yes Historical Provider, MD  potassium chloride (K-DUR,KLOR-CON) 10 MEQ tablet Take 10 mEq by mouth 2 (two) times daily.   Yes Historical Provider, MD  spironolactone (ALDACTONE)  100 MG tablet Take 100 mg by mouth daily.     Yes Historical Provider, MD  tolterodine (DETROL LA) 4 MG 24 hr capsule Take 1 capsule (4 mg total) by mouth daily. 09/01/13  Yes Evie Lacks Plotnikov, MD  torsemide (DEMADEX) 20 MG tablet TAKE 2 TABLETS BY MOUTH DAILY AS NEEDED FOR SWELLING 02/14/13  Yes Evie Lacks Plotnikov, MD  traMADol (ULTRAM) 50 MG tablet TAKE 1 TABLET BY MOUTH EVERY 8 HOURS AS NEEDED FOR PAIN 01/11/13  Yes Evie Lacks Plotnikov, MD  triamcinolone cream (KENALOG) 0.5 % Apply topically 3 (three) times daily. To the rash on R leg 05/22/13  Yes Evie Lacks Plotnikov, MD  VOLTAREN 1 % GEL Apply 1 application topically 4 (four) times daily. 04/01/11  Yes Cassandria Anger, MD  ACCU-CHEK COMPACT TEST DRUM test strip USE 4 TIMES A DAY AS DIRECTED 09/15/12   Cassandria Anger, MD  Colesevelam HCl [facility]) 3.75 G PACK 1 pack qd prn diarrhea 11/12/11   Cassandria Anger, MD  glucose blood (FREESTYLE LITE) test strip Use as directed to check blood sugar twice daily dx 250.00 04/04/13   Evie Lacks Plotnikov, MD  Insulin Pen Needle (B-D UF III MINI PEN NEEDLES) 31G X 5 MM MISC as directed.      Historical Provider, MD  Lancets (FREESTYLE) lancets 1 each by Other route 2 (two) times daily as needed. Use as instructed     Historical Provider, MD   Physical Exam: Blood pressure 124/68, pulse 95, temperature 98.4 F (36.9 C), temperature source Oral, resp. rate 16, height 5\' 3"  (1.6 m), weight 81.647 kg (180 lb), SpO2 93.00%. Filed Vitals:   10/29/13 1015  BP:   Pulse: 95  Temp:   Resp: 16     General:  EOMI, NCAT, morbidly obese  Eyes:  no pallor no icterus  ENT: Soft supple nontender   Neck:  soft supple query thyromegaly   Cardiovascular:  S1-S2 no murmur rub or gallop, chest wall tenderness at left sternal costal angle  Respiratory: Clinically clear   Abdomen: Soft nontender no rebound   Skin:  trace lower extremity edema   Musculoskeletal:  range of motion intact-tender in the  middle of back with paravertebral tenderness, also has about 13 point tenderness all over body consistent with fibromyalgia. She has more significant costosternal junction tenderness more on the left side. She has tenderness at the left a.c. joint as well as tenderness with passive motion range of motion to both shoulders.  Psychiatric:  euthymic  Neurologic:  grossly intact moving all 4 limbs equally 5/5 power   Labs on Admission:  Basic Metabolic Panel:  Recent Labs Lab 10/29/13 0613  NA 137  K 3.0*  CL 102  CO2 23  GLUCOSE 356*  BUN 13  CREATININE 0.88  CALCIUM 8.4   Liver Function Tests:  Recent Labs Lab 10/29/13 0613  AST 43*  ALT 27  ALKPHOS 116  BILITOT 1.5*  PROT 7.1  ALBUMIN 2.2*   No results found for this basename: LIPASE, AMYLASE,  in the last 168 hours No results found for this basename: AMMONIA,  in the last 168 hours CBC:  Recent Labs Lab 10/29/13 0613  WBC 2.0*  NEUTROABS PENDING  HGB 11.4*  HCT 33.2*  MCV 89.2  PLT 36*   Cardiac Enzymes: No results found for this basename: CKTOTAL, CKMB, CKMBINDEX, TROPONINI,  in the last 168 hours BNP: No components found with this basename: POCBNP,  CBG: No results found for this basename: GLUCAP,  in the last 168 hours  Radiological Exams on Admission: Dg Chest 2 View  10/29/2013   CLINICAL DATA:  Left-sided chest pain rate could neck. Diabetes and hypertension.  EXAM: CHEST  2 VIEW  COMPARISON:  DG CHEST 2 VIEW dated 06/24/2010  FINDINGS: The heart size and mediastinal contours are within normal limits. Both lungs are clear. The visualized skeletal structures are unremarkable.  IMPRESSION: No active cardiopulmonary disease.   Electronically Signed   By: Earle Gell M.D.   On: 10/29/2013 07:23   Ct Angio Chest Pe W/cm &/or Wo Cm  10/29/2013   CLINICAL DATA:  Pleuritic chest pain. Shortness of breath. Cirrhosis.  EXAM: CT ANGIOGRAPHY CHEST WITH CONTRAST  TECHNIQUE: Multidetector CT imaging of the chest was  performed using the standard protocol during bolus administration of intravenous contrast. Multiplanar CT image reconstructions including MIPs were obtained to evaluate the vascular anatomy.  CONTRAST:  61mL OMNIPAQUE IOHEXOL 350 MG/ML SOLN  COMPARISON:  04/14/2007  FINDINGS: Satisfactory opacification  of pulmonary arteries noted, and no pulmonary emboli identified. No evidence of thoracic aortic dissection or aneurysm. No evidence of mediastinal hematoma or mass. No lymphadenopathy identified within the thorax. Cardiomegaly is stable and prosthetic mitral valve again noted.  No evidence of pleural or pericardial effusion. No evidence of pulmonary infiltrate or central endobronchial lesion. No suspicious pulmonary nodules or masses identified. Hepatic cirrhosis again noted.  Review of the MIP images confirms the above findings.  IMPRESSION: Stable exam. No evidence of pulmonary embolism or other acute findings within the thorax.   Electronically Signed   By: Earle Gell M.D.   On: 10/29/2013 08:57    EKG: Independently reviewed.  see above  Time spent:  [place], [facility] Pager [phone]   If 7PM-7AM, please contact night-coverage [email or web address] Password [facility] 10/29/2013, 11:15 AM

## 2013-10-29 NOTE — ED Notes (Signed)
Pt reports having generalized chest pain since last night which radiates to her back. Pt reports SOB. Pt has not taken anything for the pain.

## 2013-10-29 NOTE — ED Notes (Signed)
Returned from [place] , MD in room to talk with patient and family, pt states that pain is still a 0/10 but still has a hard time taking a deep breath without pain

## 2013-10-30 LAB — PATHOLOGIST SMEAR REVIEW

## 2013-11-02 ENCOUNTER — Ambulatory Visit (INDEPENDENT_AMBULATORY_CARE_PROVIDER_SITE_OTHER): Payer: Medicare PPO | Admitting: Internal Medicine

## 2013-11-02 ENCOUNTER — Encounter: Payer: Self-pay | Admitting: Internal Medicine

## 2013-11-02 VITALS — BP 128/82 | HR 80 | Temp 97.7°F | Resp 16 | Wt 183.0 lb

## 2013-11-02 DIAGNOSIS — E119 Type 2 diabetes mellitus without complications: Secondary | ICD-10-CM

## 2013-11-02 DIAGNOSIS — R609 Edema, unspecified: Secondary | ICD-10-CM

## 2013-11-02 DIAGNOSIS — IMO0001 Reserved for inherently not codable concepts without codable children: Secondary | ICD-10-CM

## 2013-11-02 DIAGNOSIS — M79609 Pain in unspecified limb: Secondary | ICD-10-CM

## 2013-11-02 DIAGNOSIS — L84 Corns and callosities: Secondary | ICD-10-CM

## 2013-11-02 DIAGNOSIS — M797 Fibromyalgia: Secondary | ICD-10-CM

## 2013-11-02 DIAGNOSIS — R0789 Other chest pain: Secondary | ICD-10-CM

## 2013-11-02 DIAGNOSIS — M79671 Pain in right foot: Secondary | ICD-10-CM

## 2013-11-02 NOTE — Assessment & Plan Note (Signed)
Continue with current prescription therapy as reflected on the Med list.  

## 2013-11-02 NOTE — Assessment & Plan Note (Signed)
A little better 

## 2013-11-02 NOTE — Progress Notes (Signed)
Subjective:    Chest Pain  This is a new problem. The current episode started more than 1 month ago. The onset quality is undetermined. The problem occurs intermittently. The pain is present in the substernal region. The quality of the pain is described as dull and pressure. The pain does not radiate. Associated symptoms include back pain, dizziness and weakness. The pain is aggravated by coughing and movement. She has tried analgesics for the symptoms. The treatment provided mild relief.   She stopped potassium and many other meds for ?reason  F/u R foot injury:  heater fell on it 1 month ago - not getting any better. C/o pain and swelling of th top of R foot, still bruised. Broken plate - no need for surgery (1/15)  F/u B leg cramps R>L ... C/o swelling R>L leg...not better  The patient presents for a follow-up of chronic cirrhosis, hypertension, chronic dyslipidemia, type 2 diabetes controlled with medicines.     Wt Readings from Last 3 Encounters:  11/02/13 183 lb (83.008 kg)  10/29/13 180 lb (81.647 kg)  10/25/13 186 lb (84.369 kg)   BP Readings from Last 3 Encounters:  11/02/13 128/82  10/29/13 130/71  10/25/13 130/74        Review of Systems  Constitutional: Positive for fatigue. Negative for chills, activity change, appetite change and unexpected weight change.  HENT: Negative for mouth sores.   Eyes: Negative for visual disturbance.  Respiratory: Negative for chest tightness.   Cardiovascular: Positive for chest pain.  Genitourinary: Negative for frequency, difficulty urinating and vaginal pain.  Musculoskeletal: Positive for arthralgias, back pain and gait problem.  Skin: Negative for pallor and rash.  Neurological: Positive for dizziness, weakness and light-headedness. Negative for tremors.  Hematological: Negative for adenopathy. Bruises/bleeds easily.  Psychiatric/Behavioral: Positive for decreased concentration. Negative for suicidal ideas, confusion and  sleep disturbance. The patient is nervous/anxious.        Objective:   Physical Exam  Constitutional: She appears well-developed. No distress.  obese  HENT:  Head: Normocephalic.  Right Ear: External ear normal.  Left Ear: External ear normal.  Nose: Nose normal.  Mouth/Throat: Oropharynx is clear and moist.  Eyes: Conjunctivae are normal. Pupils are equal, round, and reactive to light. Right eye exhibits no discharge. Left eye exhibits no discharge.  Neck: Normal range of motion. Neck supple. No JVD present. No tracheal deviation present. No thyromegaly present.  Cardiovascular: Normal rate, regular rhythm and normal heart sounds.   Pulmonary/Chest: No stridor. No respiratory distress. She has no wheezes.  Abdominal: Soft. Bowel sounds are normal. She exhibits no distension and no mass. There is no tenderness. There is no rebound and no guarding.  Musculoskeletal: She exhibits edema (trace L; 1+ R) and tenderness (B knees: R>L calf).  Lymphadenopathy:    She has no cervical adenopathy.  Neurological: She displays normal reflexes. No cranial nerve deficit. She exhibits normal muscle tone. Coordination abnormal.  Skin: No rash noted. No erythema. No pallor.  Psychiatric: She has a normal mood and affect. Her behavior is normal. Judgment and thought content normal.   R foot is less swollen, less tender and less bruised  Lab Results  Component Value Date   WBC 2.0* 10/29/2013   HGB 11.4* 10/29/2013   HCT 33.2* 10/29/2013   PLT 36* 10/29/2013   GLUCOSE 356* 10/29/2013   CHOL 159 06/13/2009   TRIG 37.0 06/13/2009   HDL 66.50 06/13/2009   LDLCALC 85 06/13/2009   ALT 27 10/29/2013  AST 43* 10/29/2013   NA 137 10/29/2013   K 3.0* 10/29/2013   CL 102 10/29/2013   CREATININE 0.88 10/29/2013   BUN 13 10/29/2013   CO2 23 10/29/2013   TSH 3.19 10/12/2013   INR 1.43 10/29/2013   HGBA1C 11.0* 10/12/2013          Assessment & Plan:

## 2013-11-02 NOTE — Assessment & Plan Note (Signed)
She has an appt w/Dr Doran Durand

## 2013-11-02 NOTE — Assessment & Plan Note (Signed)
She will see Dr Doran Durand next week

## 2013-11-02 NOTE — Progress Notes (Signed)
Pre visit review using our clinic review tool, if applicable. No additional management support is needed unless otherwise documented below in the visit note. 

## 2013-11-02 NOTE — Assessment & Plan Note (Addendum)
1/15 chronic/recurrent - sounds [facility] ER visit 1/15 [name] refused NTG Rx, thor spine X ray, heart tests (ECHO, etc) and a card consult - she is overwhelmed.Marland KitchenMarland KitchenShe understands risks

## 2013-11-03 ENCOUNTER — Telehealth: Payer: Self-pay

## 2013-11-03 ENCOUNTER — Telehealth: Payer: Self-pay | Admitting: Internal Medicine

## 2013-11-03 MED ORDER — GABAPENTIN 800 MG PO TABS: 800.0000 mg | ORAL_TABLET | Freq: Three times a day (TID) | ORAL | Status: DC | PRN

## 2013-11-03 NOTE — Telephone Encounter (Signed)
Done

## 2013-11-03 NOTE — Telephone Encounter (Signed)
Pharmacy called because the patient thinks she is to take two pills of gabapentin three times a day instead of one pill three times a day.

## 2013-11-03 NOTE — Telephone Encounter (Signed)
Relevant patient education mailed to patient.  

## 2013-11-03 NOTE — Telephone Encounter (Signed)
Pt wants a refill on Gabapentin for 2 pills 3 time a day.

## 2013-11-06 MED ORDER — GABAPENTIN 800 MG PO TABS: 800.0000 mg | ORAL_TABLET | Freq: Four times a day (QID) | ORAL | Status: DC | PRN

## 2013-11-06 NOTE — Telephone Encounter (Signed)
Pt informed

## 2013-11-06 NOTE — Telephone Encounter (Signed)
Pharmacy called again this morning because [name] keeps calling them to see if it has been changed to  2 pills three times a day.

## 2013-11-06 NOTE — Telephone Encounter (Signed)
Pt called again.  She hasn't had any gabapentin for 2 days and has not slept.  Please call her pharmacy to clarify quantity.

## 2013-11-06 NOTE — Telephone Encounter (Signed)
This is too much Pls take 1 po QID prn - I'll ref #120 THX

## 2013-11-07 ENCOUNTER — Other Ambulatory Visit: Payer: Self-pay | Admitting: Internal Medicine

## 2013-11-16 ENCOUNTER — Ambulatory Visit: Payer: Medicare PPO | Admitting: Internal Medicine

## 2013-11-30 ENCOUNTER — Ambulatory Visit: Payer: Medicare PPO | Admitting: Internal Medicine

## 2013-11-30 DIAGNOSIS — Z0289 Encounter for other administrative examinations: Secondary | ICD-10-CM

## 2013-12-14 ENCOUNTER — Telehealth: Payer: Self-pay | Admitting: Internal Medicine

## 2013-12-14 DIAGNOSIS — R040 Epistaxis: Secondary | ICD-10-CM

## 2013-12-14 DIAGNOSIS — D696 Thrombocytopenia, unspecified: Secondary | ICD-10-CM

## 2013-12-14 DIAGNOSIS — D509 Iron deficiency anemia, unspecified: Secondary | ICD-10-CM

## 2013-12-14 NOTE — Telephone Encounter (Signed)
Pt had a nose bleed for a long time on Sunday.  She has felt really bad all week.  She wants to come in for labs today to see if the loss of blood has effected her.

## 2013-12-14 NOTE — Telephone Encounter (Signed)
Please put in the labs.

## 2013-12-14 NOTE — Telephone Encounter (Signed)
OK CBC, INR, ammonia, LFTs today See ENT this pm  for nose passage packing if bleeding Thx

## 2013-12-15 NOTE — Telephone Encounter (Signed)
Labs entered. Called pt- no answer/unable to leave message.

## 2013-12-15 NOTE — Telephone Encounter (Signed)
Pt is aware.  She made an appt to come in on Tues March 17

## 2013-12-19 ENCOUNTER — Encounter: Payer: Self-pay | Admitting: Internal Medicine

## 2013-12-19 ENCOUNTER — Ambulatory Visit (INDEPENDENT_AMBULATORY_CARE_PROVIDER_SITE_OTHER): Payer: Medicare PPO | Admitting: Internal Medicine

## 2013-12-19 ENCOUNTER — Other Ambulatory Visit (INDEPENDENT_AMBULATORY_CARE_PROVIDER_SITE_OTHER): Payer: Medicare PPO

## 2013-12-19 VITALS — BP 138/72 | HR 76 | Temp 98.2°F | Resp 16 | Wt 173.0 lb

## 2013-12-19 DIAGNOSIS — E119 Type 2 diabetes mellitus without complications: Secondary | ICD-10-CM

## 2013-12-19 DIAGNOSIS — K7460 Unspecified cirrhosis of liver: Secondary | ICD-10-CM

## 2013-12-19 DIAGNOSIS — I10 Essential (primary) hypertension: Secondary | ICD-10-CM

## 2013-12-19 DIAGNOSIS — M797 Fibromyalgia: Secondary | ICD-10-CM

## 2013-12-19 DIAGNOSIS — IMO0001 Reserved for inherently not codable concepts without codable children: Secondary | ICD-10-CM

## 2013-12-19 DIAGNOSIS — R040 Epistaxis: Secondary | ICD-10-CM

## 2013-12-19 LAB — HEPATIC FUNCTION PANEL
ALT: 30 U/L (ref 0–35)
AST: 45 U/L — ABNORMAL HIGH (ref 0–37)
Albumin: 2.5 g/dL — ABNORMAL LOW (ref 3.5–5.2)
Alkaline Phosphatase: 100 U/L (ref 39–117)
BILIRUBIN DIRECT: 0.8 mg/dL — AB (ref 0.0–0.3)
TOTAL PROTEIN: 7.5 g/dL (ref 6.0–8.3)
Total Bilirubin: 2.7 mg/dL — ABNORMAL HIGH (ref 0.3–1.2)

## 2013-12-19 LAB — CBC WITH DIFFERENTIAL/PLATELET
BASOS ABS: 0 10*3/uL (ref 0.0–0.1)
BASOS PCT: 0.2 % (ref 0.0–3.0)
Eosinophils Absolute: 0.5 10*3/uL (ref 0.0–0.7)
Eosinophils Relative: 14.4 % — ABNORMAL HIGH (ref 0.0–5.0)
HCT: 37.9 % (ref 36.0–46.0)
Hemoglobin: 12.5 g/dL (ref 12.0–15.0)
Lymphocytes Relative: 19.1 % (ref 12.0–46.0)
Lymphs Abs: 0.6 10*3/uL — ABNORMAL LOW (ref 0.7–4.0)
MCHC: 32.9 g/dL (ref 30.0–36.0)
MCV: 91.5 fl (ref 78.0–100.0)
Monocytes Absolute: 0.2 10*3/uL (ref 0.1–1.0)
Monocytes Relative: 6.3 % (ref 3.0–12.0)
NEUTROS PCT: 60 % (ref 43.0–77.0)
Neutro Abs: 1.9 10*3/uL (ref 1.4–7.7)
Platelets: 41 10*3/uL — CL (ref 150.0–400.0)
RBC: 4.14 Mil/uL (ref 3.87–5.11)
RDW: 17.6 % — ABNORMAL HIGH (ref 11.5–14.6)
WBC: 3.1 10*3/uL — ABNORMAL LOW (ref 4.5–10.5)

## 2013-12-19 LAB — BASIC METABOLIC PANEL
BUN: 11 mg/dL (ref 6–23)
CHLORIDE: 103 meq/L (ref 96–112)
CO2: 23 mEq/L (ref 19–32)
Calcium: 9.2 mg/dL (ref 8.4–10.5)
Creatinine, Ser: 0.9 mg/dL (ref 0.4–1.2)
GFR: 68.32 mL/min (ref >60.00)
Glucose, Bld: 280 mg/dL — ABNORMAL HIGH (ref 70–99)
Potassium: 4.1 mEq/L (ref 3.5–5.1)
SODIUM: 134 meq/L — AB (ref 135–145)

## 2013-12-19 LAB — PROTIME-INR
INR: 1.4 ratio — AB (ref 0.8–1.0)
PROTHROMBIN TIME: 14.5 s — AB (ref 10.2–12.4)

## 2013-12-19 NOTE — Assessment & Plan Note (Signed)
ENT and Hematology consults were suggested - pt refused Labs Ice, Pressure, Afrin nasal spray for nose bleeds

## 2013-12-19 NOTE — Progress Notes (Signed)
Pre visit review using our clinic review tool, if applicable. No additional management support is needed unless otherwise documented below in the visit note. 

## 2013-12-19 NOTE — Patient Instructions (Signed)
Ice, Pressure, Afrin nasal spray for nose bleeds as needed  Wt Readings from Last 3 Encounters:  12/19/13 173 lb (78.472 kg)  11/02/13 183 lb (83.008 kg)  10/29/13 180 lb (81.647 kg)

## 2013-12-19 NOTE — Assessment & Plan Note (Signed)
Continue with current prescription therapy as reflected on the Med list.  

## 2013-12-19 NOTE — Assessment & Plan Note (Signed)
Labs

## 2013-12-19 NOTE — Progress Notes (Signed)
Subjective:    Epistaxis  The bleeding has been from both nares. This is a recurrent problem. The current episode started 1 to 4 weeks ago. The problem occurs daily. The problem has been waxing and waning. The bleeding is associated with nothing. She has tried ice and pressure for the symptoms. The treatment provided mild relief. Her past medical history is significant for a bleeding disorder and frequent nosebleeds. There is no history of allergies or sinus problems.   She stopped potassium and many other meds for ?reason  F/u R foot injury:  heater fell on it 1 month ago - not getting any better. C/o pain and swelling of th top of R foot, still bruised. Broken plate - no need for surgery (1/15)  F/u B leg cramps R>L ... C/o swelling R>L leg...not better  The patient presents for a follow-up of chronic cirrhosis, hypertension, chronic dyslipidemia, type 2 diabetes controlled with medicines.     Wt Readings from Last 3 Encounters:  12/19/13 173 lb (78.472 kg)  11/02/13 183 lb (83.008 kg)  10/29/13 180 lb (81.647 kg)   BP Readings from Last 3 Encounters:  12/19/13 138/72  11/02/13 128/82  10/29/13 130/71        Review of Systems  Constitutional: Positive for fatigue. Negative for chills, activity change, appetite change and unexpected weight change.  HENT: Positive for nosebleeds. Negative for mouth sores.   Eyes: Negative for visual disturbance.  Respiratory: Negative for chest tightness.   Genitourinary: Negative for frequency, difficulty urinating and vaginal pain.  Musculoskeletal: Positive for arthralgias and gait problem.  Skin: Negative for pallor and rash.  Neurological: Positive for light-headedness. Negative for tremors.  Hematological: Negative for adenopathy. Bruises/bleeds easily.  Psychiatric/Behavioral: Positive for decreased concentration. Negative for suicidal ideas, confusion and sleep disturbance. The patient is nervous/anxious.        Objective:   Physical Exam  Constitutional: She appears well-developed. No distress.  obese  HENT:  Head: Normocephalic.  Right Ear: External ear normal.  Left Ear: External ear normal.  Nose: Nose normal.  Mouth/Throat: Oropharynx is clear and moist.  Eyes: Conjunctivae are normal. Pupils are equal, round, and reactive to light. Right eye exhibits no discharge. Left eye exhibits no discharge.  Neck: Normal range of motion. Neck supple. No JVD present. No tracheal deviation present. No thyromegaly present.  Cardiovascular: Normal rate, regular rhythm and normal heart sounds.   Pulmonary/Chest: No stridor. No respiratory distress. She has no wheezes.  Abdominal: Soft. Bowel sounds are normal. She exhibits no distension and no mass. There is no tenderness. There is no rebound and no guarding.  Musculoskeletal: She exhibits edema (trace L; 1+ R) and tenderness (B knees: R>L calf).  Lymphadenopathy:    She has no cervical adenopathy.  Neurological: She displays normal reflexes. No cranial nerve deficit. She exhibits normal muscle tone. Coordination abnormal.  Skin: No rash noted. No erythema. No pallor.  Psychiatric: She has a normal mood and affect. Her behavior is normal. Judgment and thought content normal.  dry blood in B nares R foot is less swollen, less tender and less bruised  Lab Results  Component Value Date   WBC 2.0* 10/29/2013   HGB 11.4* 10/29/2013   HCT 33.2* 10/29/2013   PLT 36* 10/29/2013   GLUCOSE 356* 10/29/2013   CHOL 159 06/13/2009   TRIG 37.0 06/13/2009   HDL 66.50 06/13/2009   LDLCALC 85 06/13/2009   ALT 27 10/29/2013   AST 43* 10/29/2013   NA  137 10/29/2013   K 3.0* 10/29/2013   CL 102 10/29/2013   CREATININE 0.88 10/29/2013   BUN 13 10/29/2013   CO2 23 10/29/2013   TSH 3.19 10/12/2013   INR 1.43 10/29/2013   HGBA1C 11.0* 10/12/2013          Assessment & Plan:

## 2013-12-19 NOTE — Assessment & Plan Note (Signed)
Wt Readings from Last 3 Encounters:  12/19/13 173 lb (78.472 kg)  11/02/13 183 lb (83.008 kg)  10/29/13 180 lb (81.647 kg)

## 2013-12-20 ENCOUNTER — Telehealth: Payer: Self-pay | Admitting: Internal Medicine

## 2013-12-20 NOTE — Telephone Encounter (Signed)
Relevant patient education mailed to patient.  

## 2013-12-22 ENCOUNTER — Telehealth: Payer: Self-pay | Admitting: Internal Medicine

## 2013-12-22 MED ORDER — KETOCONAZOLE 2 % EX CREA: 1.0000 "application " | TOPICAL_CREAM | Freq: Every day | CUTANEOUS | Status: DC

## 2013-12-22 NOTE — Telephone Encounter (Signed)
Pt is having the same problem she had a few months ago.  She has rash around the vagina area.  Her tongue is red and sore.  She thinks it is a yeast infection.  Is there something that can be called in?  Please mail her a copy of her last labs

## 2013-12-22 NOTE — Telephone Encounter (Signed)
Use ketoconazole cream around vaginal area - emailed Use Baking soda in water solution to rinze mouth qid Thx

## 2013-12-25 MED ORDER — TERCONAZOLE 0.4 % VA CREA: 1.0000 | TOPICAL_CREAM | Freq: Every day | VAGINAL | Status: DC

## 2013-12-25 NOTE — Telephone Encounter (Signed)
Ok Will change to [employer]

## 2013-12-25 NOTE — Telephone Encounter (Signed)
Pt called to say the instructions on the ketoconazole said not to use in the vagina.  She want something she can use inside and around.

## 2013-12-26 NOTE — Telephone Encounter (Signed)
She wants her labs mailed to her.

## 2013-12-28 NOTE — Telephone Encounter (Signed)
Labs mailed to pt

## 2014-01-11 ENCOUNTER — Ambulatory Visit: Payer: Medicare PPO | Admitting: Internal Medicine

## 2014-02-01 ENCOUNTER — Ambulatory Visit: Payer: Medicare PPO | Admitting: Internal Medicine

## 2014-02-13 ENCOUNTER — Ambulatory Visit: Payer: Medicare PPO | Admitting: Internal Medicine

## 2014-02-20 ENCOUNTER — Encounter: Payer: Self-pay | Admitting: Internal Medicine

## 2014-02-20 ENCOUNTER — Other Ambulatory Visit (INDEPENDENT_AMBULATORY_CARE_PROVIDER_SITE_OTHER): Payer: Medicare PPO

## 2014-02-20 ENCOUNTER — Ambulatory Visit (INDEPENDENT_AMBULATORY_CARE_PROVIDER_SITE_OTHER): Payer: Medicare PPO | Admitting: Internal Medicine

## 2014-02-20 VITALS — BP 130/80 | HR 72 | Temp 97.8°F | Resp 16 | Wt 182.0 lb

## 2014-02-20 DIAGNOSIS — F329 Major depressive disorder, single episode, unspecified: Secondary | ICD-10-CM

## 2014-02-20 DIAGNOSIS — M797 Fibromyalgia: Secondary | ICD-10-CM

## 2014-02-20 DIAGNOSIS — K7460 Unspecified cirrhosis of liver: Secondary | ICD-10-CM

## 2014-02-20 DIAGNOSIS — IMO0001 Reserved for inherently not codable concepts without codable children: Secondary | ICD-10-CM

## 2014-02-20 DIAGNOSIS — K5010 Crohn's disease of large intestine without complications: Secondary | ICD-10-CM

## 2014-02-20 DIAGNOSIS — E119 Type 2 diabetes mellitus without complications: Secondary | ICD-10-CM

## 2014-02-20 DIAGNOSIS — I10 Essential (primary) hypertension: Secondary | ICD-10-CM

## 2014-02-20 DIAGNOSIS — F3289 Other specified depressive episodes: Secondary | ICD-10-CM

## 2014-02-20 DIAGNOSIS — D509 Iron deficiency anemia, unspecified: Secondary | ICD-10-CM

## 2014-02-20 LAB — BASIC METABOLIC PANEL
BUN: 10 mg/dL (ref 6–23)
CHLORIDE: 106 meq/L (ref 96–112)
CO2: 23 meq/L (ref 19–32)
CREATININE: 0.7 mg/dL (ref 0.4–1.2)
Calcium: 8.8 mg/dL (ref 8.4–10.5)
GFR: 83.39 mL/min (ref >60.00)
GLUCOSE: 81 mg/dL (ref 70–99)
Potassium: 2.8 mEq/L — CL (ref 3.5–5.1)
Sodium: 135 mEq/L (ref 135–145)

## 2014-02-20 LAB — HEMOGLOBIN A1C: Hgb A1c MFr Bld: 11.6 % — ABNORMAL HIGH (ref 4.6–6.5)

## 2014-02-20 LAB — GLUCOSE, POCT (MANUAL RESULT ENTRY): POC GLUCOSE: 127 mg/dL — AB (ref 70–99)

## 2014-02-20 LAB — PROTIME-INR
INR: 1.3 ratio — ABNORMAL HIGH (ref 0.8–1.0)
Prothrombin Time: 14.6 s — ABNORMAL HIGH (ref 9.6–13.1)

## 2014-02-20 LAB — HEPATIC FUNCTION PANEL
ALK PHOS: 104 U/L (ref 39–117)
ALT: 28 U/L (ref 0–35)
AST: 44 U/L — AB (ref 0–37)
Albumin: 2.3 g/dL — ABNORMAL LOW (ref 3.5–5.2)
BILIRUBIN TOTAL: 1.6 mg/dL — AB (ref 0.2–1.2)
Bilirubin, Direct: 0.6 mg/dL — ABNORMAL HIGH (ref 0.0–0.3)
Total Protein: 7.1 g/dL (ref 6.0–8.3)

## 2014-02-20 LAB — CBC WITH DIFFERENTIAL/PLATELET
BASOS PCT: 0.2 % (ref 0.0–3.0)
Basophils Absolute: 0 10*3/uL (ref 0.0–0.1)
EOS PCT: 9.7 % — AB (ref 0.0–5.0)
Eosinophils Absolute: 0.4 10*3/uL (ref 0.0–0.7)
HCT: 38.1 % (ref 36.0–46.0)
HEMOGLOBIN: 12.9 g/dL (ref 12.0–15.0)
Lymphocytes Relative: 33.7 % (ref 12.0–46.0)
Lymphs Abs: 1.5 10*3/uL (ref 0.7–4.0)
MCHC: 33.9 g/dL (ref 30.0–36.0)
MCV: 92.3 fl (ref 78.0–100.0)
Monocytes Absolute: 0.4 10*3/uL (ref 0.1–1.0)
Monocytes Relative: 8.9 % (ref 3.0–12.0)
NEUTROS ABS: 2.1 10*3/uL (ref 1.4–7.7)
NEUTROS PCT: 47.5 % (ref 43.0–77.0)
Platelets: 48 10*3/uL — ABNORMAL LOW (ref 150.0–400.0)
RBC: 4.13 Mil/uL (ref 3.87–5.11)
RDW: 19.7 % — ABNORMAL HIGH (ref 11.5–15.5)
WBC: 4.4 10*3/uL (ref 4.0–10.5)

## 2014-02-20 MED ORDER — DICLOFENAC SODIUM 3 % TD GEL: TRANSDERMAL | Status: DC

## 2014-02-20 MED ORDER — FESOTERODINE FUMARATE ER 4 MG PO TB24
4.0000 mg | ORAL_TABLET | Freq: Every day | ORAL | Status: DC
Start: 2014-02-20 — End: 2014-02-23

## 2014-02-20 MED ORDER — COLESEVELAM HCL 3.75 G PO PACK: PACK | ORAL | Status: DC

## 2014-02-20 NOTE — Assessment & Plan Note (Signed)
Continue with current prescription therapy as reflected on the Med list.  

## 2014-02-20 NOTE — Assessment & Plan Note (Signed)
Diarrhea now See rx

## 2014-02-20 NOTE — Progress Notes (Signed)
Pre visit review using our clinic review tool, if applicable. No additional management support is needed unless otherwise documented below in the visit note. 

## 2014-02-20 NOTE — Progress Notes (Signed)
Subjective:    Epistaxis  The bleeding has been from both nares. This is a recurrent problem. The current episode started 1 to 4 weeks ago. The problem occurs daily. The problem has been waxing and waning. The bleeding is associated with nothing. She has tried ice and pressure for the symptoms. The treatment provided mild relief. Her past medical history is significant for a bleeding disorder and frequent nosebleeds. There is no history of allergies or sinus problems.   She stopped potassium and many other meds for ?reason  F/u R foot injury:  heater fell on it 1 month ago - not getting any better. C/o pain and swelling of th top of R foot, still bruised. Broken plate - no need for surgery (1/15)  F/u B leg cramps R>L ...   The patient presents for a follow-up of chronic cirrhosis, hypertension, chronic dyslipidemia, type 2 diabetes controlled with medicines.     Wt Readings from Last 3 Encounters:  02/20/14 182 lb (82.555 kg)  12/19/13 173 lb (78.472 kg)  11/02/13 183 lb (83.008 kg)   BP Readings from Last 3 Encounters:  02/20/14 130/80  12/19/13 138/72  11/02/13 128/82        Review of Systems  Constitutional: Positive for fatigue. Negative for chills, activity change, appetite change and unexpected weight change.  HENT: Positive for nosebleeds. Negative for mouth sores.   Eyes: Negative for visual disturbance.  Respiratory: Negative for chest tightness.   Genitourinary: Negative for frequency, difficulty urinating and vaginal pain.  Musculoskeletal: Positive for arthralgias and gait problem.  Skin: Negative for pallor and rash.  Neurological: Positive for light-headedness. Negative for tremors.  Hematological: Negative for adenopathy. Bruises/bleeds easily.  Psychiatric/Behavioral: Positive for decreased concentration. Negative for suicidal ideas, confusion and sleep disturbance. The patient is nervous/anxious.        Objective:   Physical Exam  Constitutional:  She appears well-developed. No distress.  obese  HENT:  Head: Normocephalic.  Right Ear: External ear normal.  Left Ear: External ear normal.  Nose: Nose normal.  Mouth/Throat: Oropharynx is clear and moist.  Eyes: Conjunctivae are normal. Pupils are equal, round, and reactive to light. Right eye exhibits no discharge. Left eye exhibits no discharge.  Neck: Normal range of motion. Neck supple. No JVD present. No tracheal deviation present. No thyromegaly present.  Cardiovascular: Normal rate, regular rhythm and normal heart sounds.   Pulmonary/Chest: No stridor. No respiratory distress. She has no wheezes.  Abdominal: Soft. Bowel sounds are normal. She exhibits no distension and no mass. There is no tenderness. There is no rebound and no guarding.  Musculoskeletal: She exhibits edema (trace L; 1+ R) and tenderness (B knees: R>L calf).  Lymphadenopathy:    She has no cervical adenopathy.  Neurological: She displays normal reflexes. No cranial nerve deficit. She exhibits normal muscle tone. Coordination abnormal.  Skin: No rash noted. No erythema. No pallor.  Psychiatric: She has a normal mood and affect. Her behavior is normal. Judgment and thought content normal.  No dry blood in nares R foot is ok  Lab Results  Component Value Date   WBC 3.1* 12/19/2013   HGB 12.5 12/19/2013   HCT 37.9 12/19/2013   PLT 41.0 Repeated and verified X2.* 12/19/2013   GLUCOSE 280* 12/19/2013   CHOL 159 06/13/2009   TRIG 37.0 06/13/2009   HDL 66.50 06/13/2009   LDLCALC 85 06/13/2009   ALT 30 12/19/2013   AST 45* 12/19/2013   NA 134* 12/19/2013   K  4.1 12/19/2013   CL 103 12/19/2013   CREATININE 0.9 12/19/2013   BUN 11 12/19/2013   CO2 23 12/19/2013   TSH 3.19 10/12/2013   INR 1.4* 12/19/2013   HGBA1C 11.0* 10/12/2013          Assessment & Plan:

## 2014-02-23 ENCOUNTER — Emergency Department (HOSPITAL_COMMUNITY): Payer: Medicare PPO

## 2014-02-23 ENCOUNTER — Telehealth: Payer: Self-pay | Admitting: Internal Medicine

## 2014-02-23 ENCOUNTER — Encounter (HOSPITAL_COMMUNITY): Payer: Self-pay | Admitting: Emergency Medicine

## 2014-02-23 ENCOUNTER — Inpatient Hospital Stay (HOSPITAL_COMMUNITY)
Admission: EM | Admit: 2014-02-23 | Discharge: 2014-03-01 | DRG: 386 | Disposition: A | Discharge status: Home / Self Care | Payer: Medicare PPO | Attending: Internal Medicine | Admitting: Internal Medicine

## 2014-02-23 DIAGNOSIS — B370 Candidal stomatitis: Secondary | ICD-10-CM

## 2014-02-23 DIAGNOSIS — E860 Dehydration: Secondary | ICD-10-CM

## 2014-02-23 DIAGNOSIS — L03211 Cellulitis of face: Secondary | ICD-10-CM

## 2014-02-23 DIAGNOSIS — E162 Hypoglycemia, unspecified: Secondary | ICD-10-CM

## 2014-02-23 DIAGNOSIS — D509 Iron deficiency anemia, unspecified: Secondary | ICD-10-CM

## 2014-02-23 DIAGNOSIS — L84 Corns and callosities: Secondary | ICD-10-CM

## 2014-02-23 DIAGNOSIS — K5010 Crohn's disease of large intestine without complications: Secondary | ICD-10-CM

## 2014-02-23 DIAGNOSIS — E119 Type 2 diabetes mellitus without complications: Secondary | ICD-10-CM

## 2014-02-23 DIAGNOSIS — M79671 Pain in right foot: Secondary | ICD-10-CM

## 2014-02-23 DIAGNOSIS — Z0000 Encounter for general adult medical examination without abnormal findings: Secondary | ICD-10-CM

## 2014-02-23 DIAGNOSIS — IMO0001 Reserved for inherently not codable concepts without codable children: Secondary | ICD-10-CM | POA: Diagnosis present

## 2014-02-23 DIAGNOSIS — R51 Headache: Secondary | ICD-10-CM

## 2014-02-23 DIAGNOSIS — Z79899 Other long term (current) drug therapy: Secondary | ICD-10-CM

## 2014-02-23 DIAGNOSIS — R1032 Left lower quadrant pain: Secondary | ICD-10-CM

## 2014-02-23 DIAGNOSIS — E1165 Type 2 diabetes mellitus with hyperglycemia: Secondary | ICD-10-CM

## 2014-02-23 DIAGNOSIS — N289 Disorder of kidney and ureter, unspecified: Secondary | ICD-10-CM

## 2014-02-23 DIAGNOSIS — R040 Epistaxis: Secondary | ICD-10-CM

## 2014-02-23 DIAGNOSIS — D684 Acquired coagulation factor deficiency: Secondary | ICD-10-CM | POA: Diagnosis present

## 2014-02-23 DIAGNOSIS — Z794 Long term (current) use of insulin: Secondary | ICD-10-CM

## 2014-02-23 DIAGNOSIS — Z8673 Personal history of transient ischemic attack (TIA), and cerebral infarction without residual deficits: Secondary | ICD-10-CM

## 2014-02-23 DIAGNOSIS — N3289 Other specified disorders of bladder: Secondary | ICD-10-CM

## 2014-02-23 DIAGNOSIS — R252 Cramp and spasm: Secondary | ICD-10-CM

## 2014-02-23 DIAGNOSIS — L02415 Cutaneous abscess of right lower limb: Secondary | ICD-10-CM

## 2014-02-23 DIAGNOSIS — R197 Diarrhea, unspecified: Secondary | ICD-10-CM

## 2014-02-23 DIAGNOSIS — R52 Pain, unspecified: Secondary | ICD-10-CM

## 2014-02-23 DIAGNOSIS — I10 Essential (primary) hypertension: Secondary | ICD-10-CM

## 2014-02-23 DIAGNOSIS — K7460 Unspecified cirrhosis of liver: Secondary | ICD-10-CM

## 2014-02-23 DIAGNOSIS — R739 Hyperglycemia, unspecified: Secondary | ICD-10-CM

## 2014-02-23 DIAGNOSIS — R319 Hematuria, unspecified: Secondary | ICD-10-CM

## 2014-02-23 DIAGNOSIS — K5289 Other specified noninfective gastroenteritis and colitis: Secondary | ICD-10-CM

## 2014-02-23 DIAGNOSIS — L719 Rosacea, unspecified: Secondary | ICD-10-CM

## 2014-02-23 DIAGNOSIS — D61818 Other pancytopenia: Secondary | ICD-10-CM | POA: Diagnosis present

## 2014-02-23 DIAGNOSIS — M79672 Pain in left foot: Secondary | ICD-10-CM

## 2014-02-23 DIAGNOSIS — J209 Acute bronchitis, unspecified: Secondary | ICD-10-CM

## 2014-02-23 DIAGNOSIS — L0201 Cutaneous abscess of face: Secondary | ICD-10-CM

## 2014-02-23 DIAGNOSIS — R0789 Other chest pain: Secondary | ICD-10-CM

## 2014-02-23 DIAGNOSIS — R1012 Left upper quadrant pain: Secondary | ICD-10-CM

## 2014-02-23 DIAGNOSIS — Z6832 Body mass index (BMI) 32.0-32.9, adult: Secondary | ICD-10-CM

## 2014-02-23 DIAGNOSIS — R609 Edema, unspecified: Secondary | ICD-10-CM

## 2014-02-23 DIAGNOSIS — M542 Cervicalgia: Secondary | ICD-10-CM

## 2014-02-23 DIAGNOSIS — F3289 Other specified depressive episodes: Secondary | ICD-10-CM

## 2014-02-23 DIAGNOSIS — H9190 Unspecified hearing loss, unspecified ear: Secondary | ICD-10-CM

## 2014-02-23 DIAGNOSIS — M545 Low back pain, unspecified: Secondary | ICD-10-CM

## 2014-02-23 DIAGNOSIS — K766 Portal hypertension: Secondary | ICD-10-CM | POA: Diagnosis present

## 2014-02-23 DIAGNOSIS — K5900 Constipation, unspecified: Secondary | ICD-10-CM

## 2014-02-23 DIAGNOSIS — Z8719 Personal history of other diseases of the digestive system: Secondary | ICD-10-CM

## 2014-02-23 DIAGNOSIS — Z87891 Personal history of nicotine dependence: Secondary | ICD-10-CM

## 2014-02-23 DIAGNOSIS — K5090 Crohn's disease, unspecified, without complications: Secondary | ICD-10-CM

## 2014-02-23 DIAGNOSIS — K439 Ventral hernia without obstruction or gangrene: Secondary | ICD-10-CM

## 2014-02-23 DIAGNOSIS — N179 Acute kidney failure, unspecified: Secondary | ICD-10-CM | POA: Diagnosis present

## 2014-02-23 DIAGNOSIS — Z8711 Personal history of peptic ulcer disease: Secondary | ICD-10-CM

## 2014-02-23 DIAGNOSIS — K529 Noninfective gastroenteritis and colitis, unspecified: Secondary | ICD-10-CM

## 2014-02-23 DIAGNOSIS — J069 Acute upper respiratory infection, unspecified: Secondary | ICD-10-CM

## 2014-02-23 DIAGNOSIS — E871 Hypo-osmolality and hyponatremia: Secondary | ICD-10-CM

## 2014-02-23 DIAGNOSIS — R21 Rash and other nonspecific skin eruption: Secondary | ICD-10-CM

## 2014-02-23 DIAGNOSIS — K5080 Crohn's disease of both small and large intestine without complications: Principal | ICD-10-CM

## 2014-02-23 DIAGNOSIS — L03119 Cellulitis of unspecified part of limb: Secondary | ICD-10-CM

## 2014-02-23 DIAGNOSIS — K922 Gastrointestinal hemorrhage, unspecified: Secondary | ICD-10-CM | POA: Diagnosis present

## 2014-02-23 DIAGNOSIS — T380X5A Adverse effect of glucocorticoids and synthetic analogues, initial encounter: Secondary | ICD-10-CM | POA: Diagnosis not present

## 2014-02-23 DIAGNOSIS — N390 Urinary tract infection, site not specified: Secondary | ICD-10-CM

## 2014-02-23 DIAGNOSIS — M25569 Pain in unspecified knee: Secondary | ICD-10-CM

## 2014-02-23 DIAGNOSIS — F329 Major depressive disorder, single episode, unspecified: Secondary | ICD-10-CM | POA: Diagnosis present

## 2014-02-23 DIAGNOSIS — K56609 Unspecified intestinal obstruction, unspecified as to partial versus complete obstruction: Secondary | ICD-10-CM

## 2014-02-23 DIAGNOSIS — T148XXA Other injury of unspecified body region, initial encounter: Secondary | ICD-10-CM

## 2014-02-23 DIAGNOSIS — M5030 Other cervical disc degeneration, unspecified cervical region: Secondary | ICD-10-CM

## 2014-02-23 DIAGNOSIS — R1031 Right lower quadrant pain: Secondary | ICD-10-CM

## 2014-02-23 DIAGNOSIS — R1011 Right upper quadrant pain: Secondary | ICD-10-CM

## 2014-02-23 DIAGNOSIS — M797 Fibromyalgia: Secondary | ICD-10-CM

## 2014-02-23 DIAGNOSIS — I609 Nontraumatic subarachnoid hemorrhage, unspecified: Secondary | ICD-10-CM

## 2014-02-23 DIAGNOSIS — M79609 Pain in unspecified limb: Secondary | ICD-10-CM

## 2014-02-23 DIAGNOSIS — D696 Thrombocytopenia, unspecified: Secondary | ICD-10-CM

## 2014-02-23 DIAGNOSIS — L02419 Cutaneous abscess of limb, unspecified: Secondary | ICD-10-CM

## 2014-02-23 DIAGNOSIS — B3781 Candidal esophagitis: Secondary | ICD-10-CM

## 2014-02-23 DIAGNOSIS — Z87442 Personal history of urinary calculi: Secondary | ICD-10-CM

## 2014-02-23 LAB — COMPREHENSIVE METABOLIC PANEL
ALK PHOS: 85 U/L (ref 39–117)
ALT: 25 U/L (ref 0–35)
AST: 42 U/L — ABNORMAL HIGH (ref 0–37)
Albumin: 2.2 g/dL — ABNORMAL LOW (ref 3.5–5.2)
BUN: 34 mg/dL — ABNORMAL HIGH (ref 6–23)
CO2: 21 mEq/L (ref 19–32)
Calcium: 8.7 mg/dL (ref 8.4–10.5)
Chloride: 94 mEq/L — ABNORMAL LOW (ref 96–112)
Creatinine, Ser: 1.36 mg/dL — ABNORMAL HIGH (ref 0.50–1.10)
GFR calc Af Amer: 46 mL/min — ABNORMAL LOW (ref >90)
GFR calc non Af Amer: 40 mL/min — ABNORMAL LOW (ref >90)
Glucose, Bld: 153 mg/dL — ABNORMAL HIGH (ref 70–99)
POTASSIUM: 3.5 meq/L — AB (ref 3.7–5.3)
SODIUM: 129 meq/L — AB (ref 137–147)
TOTAL PROTEIN: 6.9 g/dL (ref 6.0–8.3)
Total Bilirubin: 2.6 mg/dL — ABNORMAL HIGH (ref 0.3–1.2)

## 2014-02-23 LAB — CBC
HCT: 37.2 % (ref 36.0–46.0)
Hemoglobin: 12.7 g/dL (ref 12.0–15.0)
MCH: 30.8 pg (ref 26.0–34.0)
MCHC: 34.1 g/dL (ref 30.0–36.0)
MCV: 90.1 fL (ref 78.0–100.0)
Platelets: 49 10*3/uL — ABNORMAL LOW (ref 150–400)
RBC: 4.13 MIL/uL (ref 3.87–5.11)
RDW: 18.8 % — AB (ref 11.5–15.5)
WBC: 5 10*3/uL (ref 4.0–10.5)

## 2014-02-23 LAB — TYPE AND SCREEN
ABO/RH(D): AB NEG
Antibody Screen: NEGATIVE

## 2014-02-23 LAB — LIPASE, BLOOD: LIPASE: 30 U/L (ref 11–59)

## 2014-02-23 LAB — URINALYSIS, ROUTINE W REFLEX MICROSCOPIC
BILIRUBIN URINE: NEGATIVE
GLUCOSE, UA: NEGATIVE mg/dL
Ketones, ur: NEGATIVE mg/dL
Nitrite: NEGATIVE
PROTEIN: NEGATIVE mg/dL
Specific Gravity, Urine: 1.014 (ref 1.005–1.030)
Urobilinogen, UA: 1 mg/dL (ref 0.0–1.0)
pH: 5.5 (ref 5.0–8.0)

## 2014-02-23 LAB — URINE MICROSCOPIC-ADD ON

## 2014-02-23 LAB — LACTIC ACID, PLASMA: Lactic Acid, Venous: 2.5 mmol/L — ABNORMAL HIGH (ref 0.5–2.2)

## 2014-02-23 LAB — PROTIME-INR
INR: 1.58 — ABNORMAL HIGH (ref 0.00–1.49)
Prothrombin Time: 18.4 seconds — ABNORMAL HIGH (ref 11.6–15.2)

## 2014-02-23 LAB — POC OCCULT BLOOD, ED: Fecal Occult Bld: POSITIVE — AB

## 2014-02-23 MED ORDER — PIPERACILLIN-TAZOBACTAM 3.375 G IVPB 30 MIN
3.3750 g | Freq: Once | INTRAVENOUS | Status: AC
  Administered 2014-02-24: 3.375 g via INTRAVENOUS
  Filled 2014-02-23: qty 50

## 2014-02-23 MED ORDER — MORPHINE SULFATE 4 MG/ML IJ SOLN
4.0000 mg | Freq: Once | INTRAMUSCULAR | Status: AC
Start: 2014-02-23 — End: 2014-02-23
  Administered 2014-02-23: 4 mg via INTRAVENOUS
  Filled 2014-02-23: qty 1

## 2014-02-23 MED ORDER — SODIUM CHLORIDE 0.9 % IV BOLUS (SEPSIS)
500.0000 mL | Freq: Once | INTRAVENOUS | Status: AC
  Administered 2014-02-23: 500 mL via INTRAVENOUS

## 2014-02-23 MED ORDER — MORPHINE SULFATE 4 MG/ML IJ SOLN
4.0000 mg | Freq: Once | INTRAMUSCULAR | Status: AC
  Administered 2014-02-23: 4 mg via INTRAVENOUS
  Filled 2014-02-23: qty 1

## 2014-02-23 MED ORDER — IOHEXOL 300 MG/ML  SOLN: 25.0000 mL | INTRAMUSCULAR | Status: AC

## 2014-02-23 NOTE — ED Notes (Addendum)
Pt requesting more pain meds. Dr. Aline Brochure made aware.

## 2014-02-23 NOTE — Telephone Encounter (Signed)
It has been a while since I have seen her. Crohn's disease. She can be better evaluated in ED

## 2014-02-23 NOTE — Telephone Encounter (Signed)
FYI: Pt has not eaten since Tues. She is vomiting and having diarrhea with blood clots.  Also having stomach pain.  Per Dr. Alain Marion advised her to go to the ER.  She does not have a way until her husband gets home.  Transferred her to the GI department because she wanted to see if Dr. Olevia Perches will see her.  She is aware to call EMS or have her husband take her to the ER.

## 2014-02-23 NOTE — ED Notes (Signed)
Went and found phlebotomy, was told they will come after a few more patients are done in the back.

## 2014-02-23 NOTE — ED Notes (Signed)
Pt remains alert and oriented x's 3, skin warm and dry, color appropriate.  Pt talking with family at bedside.

## 2014-02-23 NOTE — ED Notes (Signed)
Dr. Aline Brochure in talking with pt at this time

## 2014-02-23 NOTE — Telephone Encounter (Signed)
Noted. Agree w/ER referral. Thx

## 2014-02-23 NOTE — ED Provider Notes (Signed)
CSN: [id number]     Arrival date & time 02/23/14  1323 History   First MD Initiated Contact with Patient 02/23/14 1458     Chief Complaint  Patient presents with  . Rectal Bleeding  . Diarrhea     (Consider location/radiation/quality/duration/timing/severity/associated sxs/prior Treatment) Patient is a 68 y.o. female presenting with hematochezia and diarrhea. The history is provided by the patient.  Rectal Bleeding Quality:  Black and tarry Amount:  Moderate Duration:  3 days Timing:  Constant Progression:  Unchanged Chronicity:  New Context comment:  Abd pain, vomiting, diarrhea Similar prior episodes: no   Relieved by:  Nothing Worsened by:  Nothing tried Ineffective treatments:  None tried Associated symptoms: abdominal pain and vomiting   Associated symptoms: no dizziness and no fever   Abdominal pain:    Location:  LUQ and RUQ   Quality:  Aching and dull   Severity:  Mild   Onset quality:  Gradual   Duration:  3 days   Timing:  Constant   Progression:  Unchanged   Chronicity:  New Vomiting:    Quality:  Stomach contents (green)   Severity:  Mild   Duration:  3 days   Timing:  Intermittent   Progression:  Improving Diarrhea Quality:  Black and tarry Severity:  Mild Onset quality:  Sudden Duration:  3 days Timing:  Constant Progression:  Improving Relieved by:  Nothing Worsened by:  Nothing tried Ineffective treatments:  None tried Associated symptoms: abdominal pain and vomiting   Associated symptoms: no fever and no headaches     Past Medical History  Diagnosis Date  . Personal history of peptic ulcer disease   . Ventral hernia, unspecified, without mention of obstruction or gangrene   . Personal history of diseases of blood and blood-forming organs   . Degeneration of cervical intervertebral disc   . Regional enteritis of large intestine   . Unspecified constipation   . Unspecified intestinal obstruction   . Routine general medical examination at  a health care facility   . Rosacea   . Cirrhosis of liver without mention of alcohol   . Type II or unspecified type diabetes mellitus without mention of complication, not stated as uncontrolled   . Unspecified essential hypertension   . Subarachnoid hemorrhage   . Depressive disorder, not elsewhere classified   . Iron deficiency anemia, unspecified   . Lumbago   . Unspecified hearing loss   . Urinary tract infection, site not specified   . Morbid obesity   . Personal history of unspecified digestive disease   . Personal history of urinary calculi   . Family history of diabetes mellitus    Past Surgical History  Procedure Laterality Date  . Cholecystectomy    . Abdominal hysterectomy    . Partial colectomy    . Breast cyst excision    . Tibia fracture surgery      right  . Liver biopsy     Family History  Problem Relation Age of Onset  . Diabetes Other   . Clotting disorder Other   . Colitis Other   . Crohn's disease Other   . Heart disease Sister    History  Substance Use Topics  . Smoking status: Never Smoker   . Smokeless tobacco: Not on file  . Alcohol Use: No   OB History   Grav Para Term Preterm Abortions TAB SAB Ect Mult Living  Review of Systems  Constitutional: Negative for fever and fatigue.  HENT: Negative for congestion and drooling.   Eyes: Negative for pain.  Respiratory: Negative for cough and shortness of breath.   Cardiovascular: Negative for chest pain.  Gastrointestinal: Positive for nausea, vomiting, abdominal pain, diarrhea and hematochezia.  Genitourinary: Negative for dysuria and hematuria.       Bloody stool  Musculoskeletal: Negative for back pain, gait problem and neck pain.  Skin: Negative for color change.  Neurological: Negative for dizziness and headaches.  Hematological: Negative for adenopathy.  Psychiatric/Behavioral: Negative for behavioral problems.  All other systems reviewed and are  negative.     Allergies  Amoxicillin-pot clavulanate; Ciprofloxacin; Duragesic disc transdermal system; Metformin; Sulfamethoxazole-trimethoprim; Tetracycline hcl; Tramadol; and Azithromycin  Home Medications   Prior to Admission medications   Medication Sig Start Date End Date Taking? Authorizing Provider  ACCU-CHEK COMPACT TEST DRUM test strip USE 4 TIMES A DAY AS DIRECTED 09/15/12   Cassandria Anger, MD  buPROPion (WELLBUTRIN SR) 150 MG 12 hr tablet TAKE 1 TABLET EVERY MORNING 11/07/13   Cassandria Anger, MD  Cholecalciferol (VITAMIN D3) 1000 UNITS tablet Take 1,000 Units by mouth daily.      Historical Provider, MD  citalopram (CELEXA) 10 MG tablet Take 10 mg by mouth daily.    Historical Provider, MD  Colesevelam HCl [facility]) 3.75 G PACK 1 pack qd prn diarrhea 02/20/14   Cassandria Anger, MD  Diclofenac Sodium 3 % GEL Use 1/2 inch bid on painful joints 02/20/14   Cassandria Anger, MD  fesoterodine (TOVIAZ) 4 MG TB24 tablet Take 1 tablet (4 mg total) by mouth daily. 02/20/14   Evie Lacks Plotnikov, MD  gabapentin (NEURONTIN) 800 MG tablet Take 1 tablet (800 mg total) by mouth 4 (four) times daily as needed (leg pain). 11/06/13   Evie Lacks Plotnikov, MD  glucose blood (FREESTYLE LITE) test strip Use as directed to check blood sugar twice daily dx 250.00 04/04/13   Evie Lacks Plotnikov, MD  HUMALOG KWIKPEN 100 UNIT/ML SOPN INJECT 20 UNITS SUBCUTANEOUSLY 4 TIMES A DAY--BEFORE MEALS AND AT BEDTIME. AS DISCUSSED WITH DR 06/29/13   Cassandria Anger, MD  Insulin Pen Needle (B-D UF III MINI PEN NEEDLES) 31G X 5 MM MISC as directed.      Historical Provider, MD  ketoconazole (NIZORAL) 2 % cream Apply 1 application topically daily. 12/22/13   Evie Lacks Plotnikov, MD  lactulose (CHRONULAC) 10 GM/15ML solution TAKE 30ML'S BY MOUTH TWO TO THREE TIMES A WEEK 11/07/12   Evie Lacks Plotnikov, MD  Lancets (FREESTYLE) lancets 1 each by Other route 2 (two) times daily as needed. Use as instructed      Historical Provider, MD  mupirocin ointment (BACTROBAN) 2 % Place 1 application into the nose 2 (two) times daily. With dressing  change    Historical Provider, MD  potassium chloride (K-DUR,KLOR-CON) 10 MEQ tablet Take 10 mEq by mouth 2 (two) times daily.    Historical Provider, MD  spironolactone (ALDACTONE) 100 MG tablet Take 100 mg by mouth daily.      Historical Provider, MD  terconazole (TERAZOL 7) 0.4 % vaginal cream Place 1 applicator vaginally at bedtime. 12/25/13   Evie Lacks Plotnikov, MD  tolterodine (DETROL LA) 4 MG 24 hr capsule Take 1 capsule (4 mg total) by mouth daily. 09/01/13   Evie Lacks Plotnikov, MD  torsemide (DEMADEX) 20 MG tablet TAKE 2 TABLETS BY MOUTH DAILY AS NEEDED FOR SWELLING 02/14/13  Evie Lacks Plotnikov, MD  traMADol (ULTRAM) 50 MG tablet TAKE 1 TABLET BY MOUTH EVERY 8 HOURS AS NEEDED FOR PAIN 01/11/13   Evie Lacks Plotnikov, MD  triamcinolone cream (KENALOG) 0.5 % Apply topically 3 (three) times daily. To the rash on R leg 05/22/13   Evie Lacks Plotnikov, MD  VOLTAREN 1 % GEL Apply 1 application topically 4 (four) times daily. 04/01/11   Evie Lacks Plotnikov, MD   BP 127/68  Pulse 92  Temp(Src) 97.4 F (36.3 C) (Oral)  Resp 15  SpO2 100% Physical Exam  Nursing note and vitals reviewed. Constitutional: She is oriented to person, place, and time. She appears well-developed and well-nourished.  HENT:  Head: Normocephalic and atraumatic.  Mouth/Throat: Oropharynx is clear and moist. No oropharyngeal exudate.  Eyes: Conjunctivae and EOM are normal. Pupils are equal, round, and reactive to light.  Neck: Normal range of motion. Neck supple.  Cardiovascular: Normal rate, regular rhythm, normal heart sounds and intact distal pulses.  Exam reveals no gallop and no friction rub.   No murmur heard. Pulmonary/Chest: Effort normal and breath sounds normal. No respiratory distress. She has no wheezes.  Abdominal: Soft. Bowel sounds are normal. There is tenderness (mild ttp of  LLQ, LUQ, and RUQ). There is no rebound and no guarding.  Genitourinary:  Very mildly enlarged external hemorrhoid seen. Stool is dark brown appearing. No gross blood.   Musculoskeletal: Normal range of motion. She exhibits edema (mild pitting edema in bilateral LE's). She exhibits no tenderness.  Neurological: She is alert and oriented to person, place, and time.  Skin: Skin is warm and dry.  Psychiatric: She has a normal mood and affect. Her behavior is normal.    ED Course  Procedures (including critical care time) Labs Review Labs Reviewed  CBC - Abnormal; Notable for the following:    RDW 18.8 (*)    Platelets 49 (*)    All other components within normal limits  COMPREHENSIVE METABOLIC PANEL - Abnormal; Notable for the following:    Sodium 129 (*)    Potassium 3.5 (*)    Chloride 94 (*)    Glucose, Bld 153 (*)    BUN 34 (*)    Creatinine, Ser 1.36 (*)    Albumin 2.2 (*)    AST 42 (*)    Total Bilirubin 2.6 (*)    GFR calc non Af Amer 40 (*)    GFR calc Af Amer 46 (*)    All other components within normal limits  PROTIME-INR - Abnormal; Notable for the following:    Prothrombin Time 18.4 (*)    INR 1.58 (*)    All other components within normal limits  URINALYSIS, ROUTINE W REFLEX MICROSCOPIC - Abnormal; Notable for the following:    APPearance CLOUDY (*)    Hgb urine dipstick LARGE (*)    Leukocytes, UA LARGE (*)    All other components within normal limits  LACTIC ACID, PLASMA - Abnormal; Notable for the following:    Lactic Acid, Venous 2.5 (*)    All other components within normal limits  URINE MICROSCOPIC-ADD ON - Abnormal; Notable for the following:    Squamous Epithelial / LPF FEW (*)    All other components within normal limits  POC OCCULT BLOOD, ED - Abnormal; Notable for the following:    Fecal Occult Bld POSITIVE (*)    All other components within normal limits  STOOL CULTURE  CLOSTRIDIUM DIFFICILE BY PCR  LIPASE, BLOOD  TYPE AND SCREEN  Imaging  Review Ct Abdomen Pelvis Wo Contrast  02/23/2014   CLINICAL DATA:  Left-sided abdominal pain.  EXAM: CT ABDOMEN AND PELVIS WITHOUT CONTRAST  TECHNIQUE: Multidetector CT imaging of the abdomen and pelvis was performed following the standard protocol without IV contrast.  COMPARISON:  CT of the abdomen and pelvis from 08/27/2010  FINDINGS: A few calcified granulomata are seen at the lung bases. Dense calcification is noted at the mitral valve.  Small volume ascites is noted about the liver and spleen. Mild mesenteric edema is noted.  Diffuse cirrhotic change is noted, with extensive nodularity of the liver. The spleen is enlarged, measuring 17.5 cm in length. The patient is status post cholecystectomy, with clips noted at the gallbladder fossa. The pancreas and adrenal glands are unremarkable in appearance.  Prominent nonobstructing bilateral renal stones are seen, measuring up to 6 mm in size. There is no evidence of hydronephrosis. A 2.5 cm cyst is noted at the upper pole of the left kidney. Bilateral renal pelvicaliectasis remains within normal limits. No obstructing ureteral stones are seen. Mild nonspecific perinephric stranding is noted bilaterally.  Trace free fluid is noted tracking about small bowel loops. There is segmental wall thickening of a short segment of distal ileum at the lower abdomen, measuring up to 7 cm in length. This could reflect an infectious process, though malignancy cannot be entirely excluded. Would correlate with the patient's symptoms and consider capsule endoscopy for further evaluation.  Trace postoperative fluid is noted along the midline abdomen.  The stomach is within normal limits. No acute vascular abnormalities are seen. Minimal calcification is seen along the abdominal aorta. Mild calcification is also seen along the portal venous system.  The appendix is not definitely seen. The terminal ileum appears mildly thickened, though this may be reactive in nature due to mild  omental inflammation. The colon is grossly unremarkable in appearance.  The bladder is moderately distended and grossly unremarkable. The patient is status post hysterectomy. No suspicious adnexal masses are seen. No inguinal lymphadenopathy is seen.  No acute osseous abnormalities are identified.  IMPRESSION: 1. Segmental wall thickening of a short segment of distal ileum at the lower abdomen, measuring up to 7 cm in length. This could reflect an infectious process, though malignancy cannot be entirely excluded. Would correlate with the patient's symptoms and consider capsule endoscopy for further evaluation, when and as deemed clinically appropriate. 2. Terminal ileum appears mildly thickened, though this may be reactive in nature due to mild underlying omental inflammation. 3. Small volume ascites about the liver and spleen, with mild mesenteric edema. 4. Diffuse cirrhotic change noted; splenomegaly seen. 5. Prominent nonobstructing bilateral renal stones again noted, measuring up to 6 mm in size. Left renal cyst seen. 6. Trace focal postoperative fluid noted along the midline abdomen. 7. Dense calcification noted at the mitral valve.   Electronically Signed   By: Garald Balding M.D.   On: 02/23/2014 22:14   Dg Chest 2 View  02/23/2014   CLINICAL DATA:  Cough.  EXAM: CHEST  2 VIEW  COMPARISON:  10/29/2013 and 06/24/2014 as well as chest CT 10/29/2013  FINDINGS: The lungs are adequately inflated without focal consolidation or effusion. There are a few small nodules over the right mid to upper lung compatible calcified granulomas on as seen on chest CT. There is minimal prominence of the perihilar markings suggesting mild vascular congestion. No effusion. There is mild stable cardiomegaly. There are degenerative changes of the spine with a stable mild compression  fracture at the thoracolumbar junction.  IMPRESSION: Suggestion of mild vascular congestion with mild stable cardiomegaly.  Stable compression fracture  over the thoracolumbar junction.   Electronically Signed   By: Marin Olp M.D.   On: 02/23/2014 16:06     EKG Interpretation None      MDM   Final diagnoses:  GI bleed  Hyponatremia    3:21 PM 68 y.o. female w self reported hx of PUD, cirrhosis, DM who pw N/V/D, abd pain, and rectal bleeding for the last 3 days. Pt is AF and VSS here. LLQ pain on exam. Hemeoccult pos. Will get CT, labs, 500cc IVF bolus.   Will admit to hospitalist. Stool cx sent. Will start zosyn d/t possible infectious etiology of abd pain noted on CT.    Blanchard Kelch, MD 02/23/14 [phone]

## 2014-02-23 NOTE — ED Notes (Signed)
Phlebotomy has been called by rn-Tura Roller.  Patient needs blood typing.

## 2014-02-23 NOTE — ED Notes (Signed)
Pt up to bedside commode to urinate without any problems

## 2014-02-23 NOTE — ED Notes (Signed)
Pt was assisted to bathroom. Pt had large blood clot to come out in toilet after wiping.

## 2014-02-23 NOTE — ED Notes (Signed)
Per pt sts she has been having diarrhea over the past week and since Wednesday she has been having blood clots coming out of rectum. sts some left sided abdominal pain.

## 2014-02-23 NOTE — Telephone Encounter (Signed)
Spoke with patient and she states she has been vomiting and having diarrhea since Tuesday.Yesterday, she started having black blood clots in the diarrhea. Instructed patient to go to ED for evaluation.(Dr. Plotnikov had already told patient to do this also.) She states she will go to ED.

## 2014-02-23 NOTE — H&P (Signed)
PCP:   Walker Kehr, MD   Chief Complaint:  diarrhea  HPI: 68 yo female h/o nonalcoholic cirrhosis, fibromyalgia comes in with 2 days of diarrhea was initially bloody red, but is now more melanotic with associated llq abd pain.  No fevers.  Not feeling well at all.  No sick contacts.  Has some n/v x 1 several days ago but none recently.  He ct abd shows colitis with possible underlying mass.  She has not been eating or drinking well.    Review of Systems:  Positive and negative as per HPI otherwise all other systems are negative  Past Medical History: Past Medical History  Diagnosis Date  . Personal history of peptic ulcer disease   . Ventral hernia, unspecified, without mention of obstruction or gangrene   . Personal history of diseases of blood and blood-forming organs   . Degeneration of cervical intervertebral disc   . Regional enteritis of large intestine   . Unspecified constipation   . Unspecified intestinal obstruction   . Routine general medical examination at a health care facility   . Rosacea   . Cirrhosis of liver without mention of alcohol   . Type II or unspecified type diabetes mellitus without mention of complication, not stated as uncontrolled   . Unspecified essential hypertension   . Subarachnoid hemorrhage   . Depressive disorder, not elsewhere classified   . Iron deficiency anemia, unspecified   . Lumbago   . Unspecified hearing loss   . Urinary tract infection, site not specified   . Morbid obesity   . Personal history of unspecified digestive disease   . Personal history of urinary calculi   . Family history of diabetes mellitus    Past Surgical History  Procedure Laterality Date  . Cholecystectomy    . Abdominal hysterectomy    . Partial colectomy    . Breast cyst excision    . Tibia fracture surgery      right  . Liver biopsy      Medications: Prior to Admission medications   Medication Sig Start Date End Date Taking? Authorizing  Provider  buPROPion (WELLBUTRIN SR) 150 MG 12 hr tablet TAKE 1 TABLET EVERY MORNING 11/07/13  Yes Cassandria Anger, MD  Cholecalciferol (VITAMIN D3) 1000 UNITS tablet Take 1,000 Units by mouth daily.     Yes Historical Provider, MD  citalopram (CELEXA) 10 MG tablet Take 10 mg by mouth daily.   Yes Historical Provider, MD  gabapentin (NEURONTIN) 800 MG tablet Take 1 tablet (800 mg total) by mouth 4 (four) times daily as needed (leg pain). 11/06/13  Yes Evie Lacks Plotnikov, MD  HUMALOG KWIKPEN 100 UNIT/ML SOPN INJECT 20 UNITS SUBCUTANEOUSLY 4 TIMES A DAY--BEFORE MEALS AND AT BEDTIME. AS DISCUSSED WITH DR 06/29/13  Yes Evie Lacks Plotnikov, MD  ketoconazole (NIZORAL) 2 % cream Apply 1 application topically daily. 12/22/13  Yes Evie Lacks Plotnikov, MD  lactulose (CHRONULAC) 10 GM/15ML solution TAKE 30ML'S BY MOUTH TWO TO THREE TIMES A WEEK 11/07/12  Yes Evie Lacks Plotnikov, MD  Lancets (FREESTYLE) lancets 1 each by Other route 2 (two) times daily as needed. Use as instructed    Yes Historical Provider, MD  mupirocin ointment (BACTROBAN) 2 % Place 1 application into the nose 2 (two) times daily. With dressing  change   Yes Historical Provider, MD  spironolactone (ALDACTONE) 100 MG tablet Take 100 mg by mouth daily.     Yes Historical Provider, MD  terconazole (TERAZOL 7) 0.4 %  vaginal cream Place 1 applicator vaginally at bedtime. 12/25/13  Yes Evie Lacks Plotnikov, MD  torsemide (DEMADEX) 20 MG tablet TAKE 2 TABLETS BY MOUTH DAILY AS NEEDED FOR SWELLING 02/14/13  Yes Evie Lacks Plotnikov, MD  traMADol (ULTRAM) 50 MG tablet TAKE 1 TABLET BY MOUTH EVERY 8 HOURS AS NEEDED FOR PAIN 01/11/13  Yes Evie Lacks Plotnikov, MD  triamcinolone cream (KENALOG) 0.5 % Apply topically 3 (three) times daily. To the rash on R leg 05/22/13  Yes Evie Lacks Plotnikov, MD  VOLTAREN 1 % GEL Apply 1 application topically 4 (four) times daily. 04/01/11  Yes Evie Lacks Plotnikov, MD  ACCU-CHEK COMPACT TEST DRUM test strip USE 4 TIMES A DAY AS  DIRECTED 09/15/12   Evie Lacks Plotnikov, MD  glucose blood (FREESTYLE LITE) test strip Use as directed to check blood sugar twice daily dx 250.00 04/04/13   Evie Lacks Plotnikov, MD  Insulin Pen Needle (B-D UF III MINI PEN NEEDLES) 31G X 5 MM MISC as directed.      Historical Provider, MD    Allergies:   Allergies  Allergen Reactions  . Amoxicillin-Pot Clavulanate Diarrhea    REACTION: diarrhea  . Ciprofloxacin Other (See Comments)    REACTION: swelling of LE  . Duragesic Disc Transdermal System [Alcohol-Fentanyl] Itching    Itching from patch  . Metformin Diarrhea  . Sulfamethoxazole-Trimethoprim Swelling    REACTION: leg edema  . Tetracycline Hcl Itching  . Tramadol Other (See Comments)    Headache  . Azithromycin Rash    Social History:  reports that she has never smoked. She does not have any smokeless tobacco history on file. She reports that she does not drink alcohol or use illicit drugs.  Family History: Family History  Problem Relation Age of Onset  . Diabetes Other   . Clotting disorder Other   . Colitis Other   . Crohn's disease Other   . Heart disease Sister     Physical Exam: Filed Vitals:   02/23/14 1800 02/23/14 1815 02/23/14 1830 02/23/14 2058  BP: 129/79 120/67 106/57 117/68  Pulse: 101 96 97 102  Temp:      TempSrc:      Resp: 30 12 16 18   SpO2: 98% 100% 97% 100%   General appearance: alert, cooperative and no distress Head: Normocephalic, without obvious abnormality, atraumatic Eyes: negative Nose: Nares normal. Septum midline. Mucosa normal. No drainage or sinus tenderness. Neck: no JVD and supple, symmetrical, trachea midline Lungs: clear to auscultation bilaterally Heart: regular rate and rhythm, S1, S2 normal, no murmur, click, rub or gallop Abdomen: soft, non-tender; bowel sounds normal; no masses,  no organomegaly  Mild llq abd pain Extremities: extremities normal, atraumatic, no cyanosis or edema Pulses: 2+ and symmetric Skin: Skin  color, texture, turgor normal. No rashes or lesions Neurologic: Grossly normal    Labs on Admission:   Recent Labs  02/23/14 1334  NA 129*  K 3.5*  CL 94*  CO2 21  GLUCOSE 153*  BUN 34*  CREATININE 1.36*  CALCIUM 8.7    Recent Labs  02/23/14 1334  AST 42*  ALT 25  ALKPHOS 85  BILITOT 2.6*  PROT 6.9  ALBUMIN 2.2*    Recent Labs  02/23/14 1723  LIPASE 30    Recent Labs  02/23/14 1334  WBC 5.0  HGB 12.7  HCT 37.2  MCV 90.1  PLT 49*   Radiological Exams on Admission: Ct Abdomen Pelvis Wo Contrast  02/23/2014   CLINICAL DATA:  Left-sided  abdominal pain.  EXAM: CT ABDOMEN AND PELVIS WITHOUT CONTRAST  TECHNIQUE: Multidetector CT imaging of the abdomen and pelvis was performed following the standard protocol without IV contrast.  COMPARISON:  CT of the abdomen and pelvis from 08/27/2010  FINDINGS: A few calcified granulomata are seen at the lung bases. Dense calcification is noted at the mitral valve.  Small volume ascites is noted about the liver and spleen. Mild mesenteric edema is noted.  Diffuse cirrhotic change is noted, with extensive nodularity of the liver. The spleen is enlarged, measuring 17.5 cm in length. The patient is status post cholecystectomy, with clips noted at the gallbladder fossa. The pancreas and adrenal glands are unremarkable in appearance.  Prominent nonobstructing bilateral renal stones are seen, measuring up to 6 mm in size. There is no evidence of hydronephrosis. A 2.5 cm cyst is noted at the upper pole of the left kidney. Bilateral renal pelvicaliectasis remains within normal limits. No obstructing ureteral stones are seen. Mild nonspecific perinephric stranding is noted bilaterally.  Trace free fluid is noted tracking about small bowel loops. There is segmental wall thickening of a short segment of distal ileum at the lower abdomen, measuring up to 7 cm in length. This could reflect an infectious process, though malignancy cannot be entirely  excluded. Would correlate with the patient's symptoms and consider capsule endoscopy for further evaluation.  Trace postoperative fluid is noted along the midline abdomen.  The stomach is within normal limits. No acute vascular abnormalities are seen. Minimal calcification is seen along the abdominal aorta. Mild calcification is also seen along the portal venous system.  The appendix is not definitely seen. The terminal ileum appears mildly thickened, though this may be reactive in nature due to mild omental inflammation. The colon is grossly unremarkable in appearance.  The bladder is moderately distended and grossly unremarkable. The patient is status post hysterectomy. No suspicious adnexal masses are seen. No inguinal lymphadenopathy is seen.  No acute osseous abnormalities are identified.  IMPRESSION: 1. Segmental wall thickening of a short segment of distal ileum at the lower abdomen, measuring up to 7 cm in length. This could reflect an infectious process, though malignancy cannot be entirely excluded. Would correlate with the patient's symptoms and consider capsule endoscopy for further evaluation, when and as deemed clinically appropriate. 2. Terminal ileum appears mildly thickened, though this may be reactive in nature due to mild underlying omental inflammation. 3. Small volume ascites about the liver and spleen, with mild mesenteric edema. 4. Diffuse cirrhotic change noted; splenomegaly seen. 5. Prominent nonobstructing bilateral renal stones again noted, measuring up to 6 mm in size. Left renal cyst seen. 6. Trace focal postoperative fluid noted along the midline abdomen. 7. Dense calcification noted at the mitral valve.   Electronically Signed   By: Garald Balding M.D.   On: 02/23/2014 22:14   Dg Chest 2 View  02/23/2014   CLINICAL DATA:  Cough.  EXAM: CHEST  2 VIEW  COMPARISON:  10/29/2013 and 06/24/2014 as well as chest CT 10/29/2013  FINDINGS: The lungs are adequately inflated without focal  consolidation or effusion. There are a few small nodules over the right mid to upper lung compatible calcified granulomas on as seen on chest CT. There is minimal prominence of the perihilar markings suggesting mild vascular congestion. No effusion. There is mild stable cardiomegaly. There are degenerative changes of the spine with a stable mild compression fracture at the thoracolumbar junction.  IMPRESSION: Suggestion of mild vascular congestion with mild stable  cardiomegaly.  Stable compression fracture over the thoracolumbar junction.   Electronically Signed   By: Marin Olp M.D.   On: 02/23/2014 16:06    Assessment/Plan  68 yo female with acute colitis with possible underlying mass  Principal Problem:   Colitis, acute-  Place on iv zosyn.  Has h/o crohns and the colon thickening is in the distal ileum.  ???underlying malignancy.  Would get gi involved in am to evaluate for colonscopy in the near future, pt is due for her 5 year surveillance anyway.  abx for now, consider adding steroids for possible crohns flare if does not improve.  Send for stool cultures.  Active Problems:  Stable unless o/w noted.   DEHYDRATION (VOLUME DEPLETION)   THROMBOCYTOPENIA   CROHN'S DISEASE-LARGE INTESTINE   CIRRHOSIS   Fibromyalgia syndrome   Bloody diarrhea   Renal insufficiency, mild   Abdominal pain, acute, left lower quadrant  Full code.  Boe Deans A Shanon Brow 02/23/2014, 10:58 PM

## 2014-02-24 ENCOUNTER — Encounter (HOSPITAL_COMMUNITY): Payer: Self-pay | Admitting: *Deleted

## 2014-02-24 DIAGNOSIS — R1031 Right lower quadrant pain: Secondary | ICD-10-CM

## 2014-02-24 DIAGNOSIS — IMO0001 Reserved for inherently not codable concepts without codable children: Secondary | ICD-10-CM

## 2014-02-24 DIAGNOSIS — R197 Diarrhea, unspecified: Secondary | ICD-10-CM

## 2014-02-24 DIAGNOSIS — K7460 Unspecified cirrhosis of liver: Secondary | ICD-10-CM

## 2014-02-24 DIAGNOSIS — K529 Noninfective gastroenteritis and colitis, unspecified: Secondary | ICD-10-CM | POA: Diagnosis present

## 2014-02-24 DIAGNOSIS — N289 Disorder of kidney and ureter, unspecified: Secondary | ICD-10-CM

## 2014-02-24 DIAGNOSIS — K922 Gastrointestinal hemorrhage, unspecified: Secondary | ICD-10-CM

## 2014-02-24 LAB — GLUCOSE, CAPILLARY
GLUCOSE-CAPILLARY: 228 mg/dL — AB (ref 70–99)
GLUCOSE-CAPILLARY: 277 mg/dL — AB (ref 70–99)
Glucose-Capillary: 120 mg/dL — ABNORMAL HIGH (ref 70–99)

## 2014-02-24 LAB — CBC
HCT: 32.2 % — ABNORMAL LOW (ref 36.0–46.0)
Hemoglobin: 11.2 g/dL — ABNORMAL LOW (ref 12.0–15.0)
MCH: 31 pg (ref 26.0–34.0)
MCHC: 34.8 g/dL (ref 30.0–36.0)
MCV: 89.2 fL (ref 78.0–100.0)
PLATELETS: 38 10*3/uL — AB (ref 150–400)
RBC: 3.61 MIL/uL — ABNORMAL LOW (ref 3.87–5.11)
RDW: 18.4 % — AB (ref 11.5–15.5)
WBC: 3.9 10*3/uL — ABNORMAL LOW (ref 4.0–10.5)

## 2014-02-24 LAB — PROTIME-INR
INR: 1.47 (ref 0.00–1.49)
Prothrombin Time: 17.4 seconds — ABNORMAL HIGH (ref 11.6–15.2)

## 2014-02-24 LAB — BASIC METABOLIC PANEL
BUN: 31 mg/dL — ABNORMAL HIGH (ref 6–23)
CALCIUM: 8.7 mg/dL (ref 8.4–10.5)
CO2: 24 mEq/L (ref 19–32)
CREATININE: 1.09 mg/dL (ref 0.50–1.10)
Chloride: 102 mEq/L (ref 96–112)
GFR calc Af Amer: 60 mL/min — ABNORMAL LOW (ref >90)
GFR, EST NON AFRICAN AMERICAN: 52 mL/min — AB (ref >90)
GLUCOSE: 159 mg/dL — AB (ref 70–99)
Potassium: 3.7 mEq/L (ref 3.7–5.3)
Sodium: 137 mEq/L (ref 137–147)

## 2014-02-24 LAB — CLOSTRIDIUM DIFFICILE BY PCR: Toxigenic C. Difficile by PCR: NEGATIVE

## 2014-02-24 LAB — MRSA PCR SCREENING: MRSA by PCR: NEGATIVE

## 2014-02-24 MED ORDER — SODIUM CHLORIDE 0.9 % IV SOLN: INTRAVENOUS | Status: DC

## 2014-02-24 MED ORDER — MORPHINE SULFATE 2 MG/ML IJ SOLN
2.0000 mg | INTRAMUSCULAR | Status: DC | PRN
  Administered 2014-02-24 – 2014-02-25 (×3): 4 mg via INTRAVENOUS
  Administered 2014-02-25: 2 mg via INTRAVENOUS
  Administered 2014-02-26 (×2): 4 mg via INTRAVENOUS
  Filled 2014-02-24: qty 1
  Filled 2014-02-24 (×6): qty 2

## 2014-02-24 MED ORDER — GABAPENTIN 400 MG PO CAPS
800.0000 mg | ORAL_CAPSULE | Freq: Four times a day (QID) | ORAL | Status: DC | PRN
  Administered 2014-02-24 – 2014-02-28 (×7): 800 mg via ORAL
  Filled 2014-02-24 (×8): qty 2

## 2014-02-24 MED ORDER — PIPERACILLIN-TAZOBACTAM 3.375 G IVPB
3.3750 g | Freq: Three times a day (TID) | INTRAVENOUS | Status: DC
  Administered 2014-02-24 – 2014-02-27 (×10): 3.375 g via INTRAVENOUS
  Filled 2014-02-24 (×12): qty 50

## 2014-02-24 MED ORDER — SODIUM CHLORIDE 0.9 % IV SOLN
INTRAVENOUS | Status: DC
  Administered 2014-02-24 (×2): via INTRAVENOUS

## 2014-02-24 MED ORDER — ALPRAZOLAM 0.25 MG PO TABS
0.2500 mg | ORAL_TABLET | Freq: Three times a day (TID) | ORAL | Status: DC | PRN
  Administered 2014-02-25 – 2014-02-27 (×2): 0.25 mg via ORAL
  Filled 2014-02-24 (×2): qty 1

## 2014-02-24 MED ORDER — ONDANSETRON HCL 4 MG PO TABS: 4.0000 mg | ORAL_TABLET | Freq: Four times a day (QID) | ORAL | Status: DC | PRN

## 2014-02-24 MED ORDER — BOOST / RESOURCE BREEZE PO LIQD
1.0000 | Freq: Three times a day (TID) | ORAL | Status: DC
  Administered 2014-02-24 – 2014-03-01 (×15): 1 via ORAL

## 2014-02-24 MED ORDER — VITAMIN D3 25 MCG (1000 UNIT) PO TABS
1000.0000 [IU] | ORAL_TABLET | Freq: Every day | ORAL | Status: DC
  Administered 2014-02-24 – 2014-03-01 (×6): 1000 [IU] via ORAL
  Filled 2014-02-24 (×6): qty 1

## 2014-02-24 MED ORDER — MUPIROCIN 2 % EX OINT
1.0000 "application " | TOPICAL_OINTMENT | Freq: Two times a day (BID) | CUTANEOUS | Status: DC
  Administered 2014-02-24 – 2014-03-01 (×12): 1 via NASAL
  Filled 2014-02-24 (×3): qty 22

## 2014-02-24 MED ORDER — GABAPENTIN 800 MG PO TABS
800.0000 mg | ORAL_TABLET | Freq: Four times a day (QID) | ORAL | Status: DC | PRN
  Filled 2014-02-24: qty 1

## 2014-02-24 MED ORDER — INSULIN ASPART 100 UNIT/ML ~~LOC~~ SOLN
0.0000 [IU] | Freq: Three times a day (TID) | SUBCUTANEOUS | Status: DC
  Administered 2014-02-24: 3 [IU] via SUBCUTANEOUS
  Administered 2014-02-25: 9 [IU] via SUBCUTANEOUS
  Administered 2014-02-25: 3 [IU] via SUBCUTANEOUS
  Administered 2014-02-25: 2 [IU] via SUBCUTANEOUS

## 2014-02-24 MED ORDER — SODIUM CHLORIDE 0.9 % IV SOLN
INTRAVENOUS | Status: DC
  Administered 2014-02-24 (×2): via INTRAVENOUS

## 2014-02-24 MED ORDER — ONDANSETRON HCL 4 MG/2ML IJ SOLN: 4.0000 mg | Freq: Four times a day (QID) | INTRAMUSCULAR | Status: DC | PRN

## 2014-02-24 MED ORDER — CITALOPRAM HYDROBROMIDE 10 MG PO TABS
10.0000 mg | ORAL_TABLET | Freq: Every day | ORAL | Status: DC
  Administered 2014-02-24 – 2014-03-01 (×6): 10 mg via ORAL
  Filled 2014-02-24 (×6): qty 1

## 2014-02-24 NOTE — Progress Notes (Signed)
PATIENT DETAILS Name: [name] Age: 68 y.o. Sex: female Date of Birth: Jun 18, 1946 Admit Date: 02/23/2014 Admitting Physician Phillips Grout, MD GEX:BMWU Plotnikov, MD  Subjective: Admitted with abd pain and bloody diarrhea. Tearful and extremely anxious-upset that the IV pump keeps beeping.  Assessment/Plan: Principal Problem: Acute Distal Ileitis -has hx of Crohn's disease-not on any maintenance medications. Not sure at this time if this is a Crohn's flare or Infection. -continue with empiric Zosyn, await Stool C Diff PCR and other stool studies - consulted GI -await their recommendations  Acute Renal Failure -likely secondary to diarrhea -resolved with IVF -monitor lytes periodically  Liver Cirrhosis -reviewed prior records-Hx of NASH -appears stable at present -hold Lactulose/Aldactone/Demadex for now-as with diarrhea  Thrombocytopenia -seecondary to above -chronic issue-close to baseline    DEHYDRATION (VOLUME DEPLETION) -better-c/w IVF  DM -start SSI-monitor CBG's  Anxiety/Fibromyalgia -c/w Wellbutrin, Celexa and Gabapentin -start prn Xanax  Disposition: Remain inpatient  DVT Prophylaxis: SCD's  Code Status: Full code   Family Communication None at bedside  Procedures:  None  CONSULTS:  GI  Time spent 40 minutes-which includes 50% of the time with face-to-face with patient/ family and coordinating care related to the above assessment and plan.    MEDICATIONS: Scheduled Meds: . sodium chloride   Intravenous STAT  . cholecalciferol  1,000 Units Oral Daily  . citalopram  10 mg Oral Daily  . mupirocin ointment  1 application Nasal BID  . piperacillin-tazobactam (ZOSYN)  IV  3.375 g Intravenous 3 times per day   Continuous Infusions: . sodium chloride 75 mL/hr at 02/24/14 0254   PRN Meds:.gabapentin, morphine injection, ondansetron (ZOFRAN) IV, ondansetron  Antibiotics: Anti-infectives   Start     Dose/Rate Route Frequency  Ordered Stop   02/24/14 0600  piperacillin-tazobactam (ZOSYN) IVPB 3.375 g     3.375 g 12.5 mL/hr over 240 Minutes Intravenous 3 times per day 02/24/14 0237     02/23/14 2315  piperacillin-tazobactam (ZOSYN) IVPB 3.375 g     3.375 g 100 mL/hr over 30 Minutes Intravenous  Once 02/23/14 2304 02/24/14 0055       PHYSICAL EXAM: Vital signs in last 24 hours: Filed Vitals:   02/24/14 0145 02/24/14 0202 02/24/14 0249 02/24/14 0404  BP: 107/66  125/75 133/76  Pulse: 102  102 104  Temp:   98.5 F (36.9 C) 97.8 F (36.6 C)  TempSrc:   Oral Oral  Resp: 9  16 16   Height:  5' 2.99" (1.6 m)    Weight:  82.6 kg (182 lb 1.6 oz)  82.6 kg (182 lb 1.6 oz)  SpO2: 94%  92% 93%    Weight change:  Filed Weights   02/24/14 0202 02/24/14 0404  Weight: 82.6 kg (182 lb 1.6 oz) 82.6 kg (182 lb 1.6 oz)   Body mass index is 32.27 kg/(m^2).   Gen Exam: Awake and alert with clear speech.   Neck: Supple, No JVD.   Chest: B/L Clear.   CVS: S1 S2 Regular, no murmurs.  Abdomen: soft, BS +, Mildly tender in the mid abdomen, non distended.  Extremities: mild bilateral edema, lower extremities warm to touch. Neurologic: Non Focal.   Skin: No Rash.   Wounds: N/A.   Intake/Output from previous day:  Intake/Output Summary (Last 24 hours) at 02/24/14 1016 Last data filed at 02/24/14 0055  Gross per 24 hour  Intake    100 ml  Output      0 ml  Net    100 ml     LAB RESULTS: CBC  Recent Labs Lab 02/20/14 1701 02/23/14 1334 02/24/14 0430  WBC 4.4 5.0 3.9*  HGB 12.9 12.7 11.2*  HCT 38.1 37.2 32.2*  PLT 48.0* 49* 38*  MCV 92.3 90.1 89.2  MCH  --  30.8 31.0  MCHC 33.9 34.1 34.8  RDW 19.7* 18.8* 18.4*  LYMPHSABS 1.5  --   --   MONOABS 0.4  --   --   EOSABS 0.4  --   --   BASOSABS 0.0  --   --     Chemistries   Recent Labs Lab 02/20/14 1701 02/23/14 1334 02/24/14 0430  NA 135 129* 137  K 2.8* 3.5* 3.7  CL 106 94* 102  CO2 23 21 24   GLUCOSE 81 153* 159*  BUN 10 34* 31*    CREATININE 0.7 1.36* 1.09  CALCIUM 8.8 8.7 8.7    CBG: No results found for this basename: GLUCAP,  in the last 168 hours  GFR Estimated Creatinine Clearance: 51.7 ml/min (by C-G formula based on Cr of 1.09).  Coagulation profile  Recent Labs Lab 02/20/14 1701 02/23/14 1723 02/24/14 0430  INR 1.3* 1.58* 1.47    Cardiac Enzymes No results found for this basename: CK, CKMB, TROPONINI, MYOGLOBIN,  in the last 168 hours  No components found with this basename: POCBNP,  No results found for this basename: DDIMER,  in the last 72 hours No results found for this basename: HGBA1C,  in the last 72 hours No results found for this basename: CHOL, HDL, LDLCALC, TRIG, CHOLHDL, LDLDIRECT,  in the last 72 hours No results found for this basename: TSH, T4TOTAL, FREET3, T3FREE, THYROIDAB,  in the last 72 hours No results found for this basename: VITAMINB12, FOLATE, FERRITIN, TIBC, IRON, RETICCTPCT,  in the last 72 hours  Recent Labs  02/23/14 1723  LIPASE 30    Urine Studies No results found for this basename: UACOL, UAPR, USPG, UPH, UTP, UGL, UKET, UBIL, UHGB, UNIT, UROB, ULEU, UEPI, UWBC, URBC, UBAC, CAST, CRYS, UCOM, BILUA,  in the last 72 hours  MICROBIOLOGY: Recent Results (from the past 240 hour(s))  MRSA PCR SCREENING     Status: None   Collection Time    02/24/14  2:26 AM      Result Value Ref Range Status   MRSA by PCR NEGATIVE  NEGATIVE Final   Comment:            The GeneXpert MRSA Assay (FDA     approved for NASAL specimens     only), is one component of a     comprehensive MRSA colonization     surveillance program. It is not     intended to diagnose MRSA     infection nor to guide or     monitor treatment for     MRSA infections.    RADIOLOGY STUDIES/RESULTS: Ct Abdomen Pelvis Wo Contrast  02/23/2014   CLINICAL DATA:  Left-sided abdominal pain.  EXAM: CT ABDOMEN AND PELVIS WITHOUT CONTRAST  TECHNIQUE: Multidetector CT imaging of the abdomen and pelvis was  performed following the standard protocol without IV contrast.  COMPARISON:  CT of the abdomen and pelvis from 08/27/2010  FINDINGS: A few calcified granulomata are seen at the lung bases. Dense calcification is noted at the mitral valve.  Small volume ascites is noted about the liver and spleen. Mild mesenteric edema is noted.  Diffuse cirrhotic change is noted, with extensive nodularity of  the liver. The spleen is enlarged, measuring 17.5 cm in length. The patient is status post cholecystectomy, with clips noted at the gallbladder fossa. The pancreas and adrenal glands are unremarkable in appearance.  Prominent nonobstructing bilateral renal stones are seen, measuring up to 6 mm in size. There is no evidence of hydronephrosis. A 2.5 cm cyst is noted at the upper pole of the left kidney. Bilateral renal pelvicaliectasis remains within normal limits. No obstructing ureteral stones are seen. Mild nonspecific perinephric stranding is noted bilaterally.  Trace free fluid is noted tracking about small bowel loops. There is segmental wall thickening of a short segment of distal ileum at the lower abdomen, measuring up to 7 cm in length. This could reflect an infectious process, though malignancy cannot be entirely excluded. Would correlate with the patient's symptoms and consider capsule endoscopy for further evaluation.  Trace postoperative fluid is noted along the midline abdomen.  The stomach is within normal limits. No acute vascular abnormalities are seen. Minimal calcification is seen along the abdominal aorta. Mild calcification is also seen along the portal venous system.  The appendix is not definitely seen. The terminal ileum appears mildly thickened, though this may be reactive in nature due to mild omental inflammation. The colon is grossly unremarkable in appearance.  The bladder is moderately distended and grossly unremarkable. The patient is status post hysterectomy. No suspicious adnexal masses are seen.  No inguinal lymphadenopathy is seen.  No acute osseous abnormalities are identified.  IMPRESSION: 1. Segmental wall thickening of a short segment of distal ileum at the lower abdomen, measuring up to 7 cm in length. This could reflect an infectious process, though malignancy cannot be entirely excluded. Would correlate with the patient's symptoms and consider capsule endoscopy for further evaluation, when and as deemed clinically appropriate. 2. Terminal ileum appears mildly thickened, though this may be reactive in nature due to mild underlying omental inflammation. 3. Small volume ascites about the liver and spleen, with mild mesenteric edema. 4. Diffuse cirrhotic change noted; splenomegaly seen. 5. Prominent nonobstructing bilateral renal stones again noted, measuring up to 6 mm in size. Left renal cyst seen. 6. Trace focal postoperative fluid noted along the midline abdomen. 7. Dense calcification noted at the mitral valve.   Electronically Signed   By: Garald Balding M.D.   On: 02/23/2014 22:14   Dg Chest 2 View  02/23/2014   CLINICAL DATA:  Cough.  EXAM: CHEST  2 VIEW  COMPARISON:  10/29/2013 and 06/24/2014 as well as chest CT 10/29/2013  FINDINGS: The lungs are adequately inflated without focal consolidation or effusion. There are a few small nodules over the right mid to upper lung compatible calcified granulomas on as seen on chest CT. There is minimal prominence of the perihilar markings suggesting mild vascular congestion. No effusion. There is mild stable cardiomegaly. There are degenerative changes of the spine with a stable mild compression fracture at the thoracolumbar junction.  IMPRESSION: Suggestion of mild vascular congestion with mild stable cardiomegaly.  Stable compression fracture over the thoracolumbar junction.   Electronically Signed   By: Marin Olp M.D.   On: 02/23/2014 16:06    Hebe Merriwether Kristeen Mans, MD  [facility] Pager:[phone]  If 7PM-7AM, please contact  night-coverage [email or web address] Password [email or web address] 02/24/2014, 10:16 AM   LOS: 1 day   **Disclaimer: This note may have been dictated with voice recognition software. Similar sounding words can inadvertently be transcribed and this note may contain transcription errors which may not have been  corrected upon publication of note.**

## 2014-02-24 NOTE — Consult Note (Signed)
Referring Provider: [facility] hospital[facility] Primary Care Physician:  Walker Kehr, MD Primary Gastroenterologist:  Dr.Brodie  Reason for Consultation:  Rectal bleeding ,abdominal pain, abnormal CT  HPI: [name] is a 68 y.o. female with multiple medical problems who was admitted through the emergency room last night with a two-day history of abdominal pain and bloody diarrhea. She has history of decompensated cirrhosis which has been managed by Dr. Alain Marion. Also with insulin-dependent diabetes mellitus, morbid obesity, depression, history of subarachnoid hemorrhage, fibromyalgia, remote peptic ulcer disease, and history of Crohn's disease. She is known to Dr. Delfin Edis, but has not been seen since 2010. She had colonoscopy in 2010 which showed a normal ileocolonic anastomosis and normal terminal ileum with no evidence for active Crohn's. Patient had CT scan last night through the emergency room which shows diffuse cirrhosis, small amount of ascites and enlarged spleen ,nonobstructing kidney stones and segmental wall thickening of the distal ileum measuring about 7 cm in length- with some mild terminal ileal thickening as well . Labs on 02/20/2014 show WBC of 4.4 hemoglobin 12.9 hematocrit 38.1 and platelets of 48,000, this morning WBC 3.9 hemoglobin 11.2 platelet count 38,000. INR was elevated on admission and is 1.47 today, BUN 31 creatinine 1.09 .C. difficile negative, and GI pathogen panel is pending . Pt has long hx of Crohns dx in 1970's and has had several resections in past. Pt reports feeling bad over the past couple months with abdominal discomfort and frequent loose stools- had an Rx of Wechol  Which was not helpful. Last week sxs worse with abdominal pain, cramping, diarrhea, nausea, then started passing blood on Thursday which was dark with clots. This continued yesterday and she came to ER.  Past Medical History  Diagnosis Date  . Personal history of peptic ulcer disease   .  Ventral hernia, unspecified, without mention of obstruction or gangrene   . Personal history of diseases of blood and blood-forming organs   . Degeneration of cervical intervertebral disc   . Regional enteritis of large intestine   . Unspecified constipation   . Unspecified intestinal obstruction   . Routine general medical examination at a health care facility   . Rosacea   . Cirrhosis of liver without mention of alcohol   . Type II or unspecified type diabetes mellitus without mention of complication, not stated as uncontrolled   . Unspecified essential hypertension   . Subarachnoid hemorrhage   . Depressive disorder, not elsewhere classified   . Iron deficiency anemia, unspecified   . Lumbago   . Unspecified hearing loss   . Urinary tract infection, site not specified   . Morbid obesity   . Personal history of unspecified digestive disease   . Personal history of urinary calculi   . Family history of diabetes mellitus     Past Surgical History  Procedure Laterality Date  . Cholecystectomy    . Abdominal hysterectomy    . Partial colectomy    . Breast cyst excision    . Tibia fracture surgery      right  . Liver biopsy      Prior to Admission medications   Medication Sig Start Date End Date Taking? Authorizing Provider  buPROPion (WELLBUTRIN SR) 150 MG 12 hr tablet TAKE 1 TABLET EVERY MORNING 11/07/13  Yes Cassandria Anger, MD  Cholecalciferol (VITAMIN D3) 1000 UNITS tablet Take 1,000 Units by mouth daily.     Yes Historical Provider, MD  citalopram (CELEXA) 10 MG tablet Take  10 mg by mouth daily.   Yes Historical Provider, MD  gabapentin (NEURONTIN) 800 MG tablet Take 1 tablet (800 mg total) by mouth 4 (four) times daily as needed (leg pain). 11/06/13  Yes Evie Lacks Plotnikov, MD  HUMALOG KWIKPEN 100 UNIT/ML SOPN INJECT 20 UNITS SUBCUTANEOUSLY 4 TIMES A DAY--BEFORE MEALS AND AT BEDTIME. AS DISCUSSED WITH DR 06/29/13  Yes Evie Lacks Plotnikov, MD  ketoconazole (NIZORAL) 2 %  cream Apply 1 application topically daily. 12/22/13  Yes Evie Lacks Plotnikov, MD  lactulose (CHRONULAC) 10 GM/15ML solution TAKE 30ML'S BY MOUTH TWO TO THREE TIMES A WEEK 11/07/12  Yes Evie Lacks Plotnikov, MD  Lancets (FREESTYLE) lancets 1 each by Other route 2 (two) times daily as needed. Use as instructed    Yes Historical Provider, MD  mupirocin ointment (BACTROBAN) 2 % Place 1 application into the nose 2 (two) times daily. With dressing  change   Yes Historical Provider, MD  spironolactone (ALDACTONE) 100 MG tablet Take 100 mg by mouth daily.     Yes Historical Provider, MD  terconazole (TERAZOL 7) 0.4 % vaginal cream Place 1 applicator vaginally at bedtime. 12/25/13  Yes Evie Lacks Plotnikov, MD  torsemide (DEMADEX) 20 MG tablet TAKE 2 TABLETS BY MOUTH DAILY AS NEEDED FOR SWELLING 02/14/13  Yes Evie Lacks Plotnikov, MD  traMADol (ULTRAM) 50 MG tablet TAKE 1 TABLET BY MOUTH EVERY 8 HOURS AS NEEDED FOR PAIN 01/11/13  Yes Evie Lacks Plotnikov, MD  triamcinolone cream (KENALOG) 0.5 % Apply topically 3 (three) times daily. To the rash on R leg 05/22/13  Yes Evie Lacks Plotnikov, MD  VOLTAREN 1 % GEL Apply 1 application topically 4 (four) times daily. 04/01/11  Yes Evie Lacks Plotnikov, MD  ACCU-CHEK COMPACT TEST DRUM test strip USE 4 TIMES A DAY AS DIRECTED 09/15/12   Evie Lacks Plotnikov, MD  glucose blood (FREESTYLE LITE) test strip Use as directed to check blood sugar twice daily dx 250.00 04/04/13   Evie Lacks Plotnikov, MD  Insulin Pen Needle (B-D UF III MINI PEN NEEDLES) 31G X 5 MM MISC as directed.      Historical Provider, MD    Current Facility-Administered Medications  Medication Dose Route Frequency Provider Last Rate Last Dose  . 0.9 %  sodium chloride infusion   Intravenous Continuous Jonetta Osgood, MD 75 mL/hr at 02/24/14 1151    . ALPRAZolam Duanne Moron) tablet 0.25 mg  0.25 mg Oral TID PRN Jonetta Osgood, MD      . cholecalciferol (VITAMIN D) tablet 1,000 Units  1,000 Units Oral Daily Phillips Grout, MD   1,000 Units at 02/24/14 1024  . citalopram (CELEXA) tablet 10 mg  10 mg Oral Daily Phillips Grout, MD   10 mg at 02/24/14 1024  . gabapentin (NEURONTIN) capsule 800 mg  800 mg Oral QID PRN Phillips Grout, MD      . insulin aspart (novoLOG) injection 0-9 Units  0-9 Units Subcutaneous TID WC Shanker Kristeen Mans, MD      . morphine 2 MG/ML injection 2-4 mg  2-4 mg Intravenous Q4H PRN Dianne Dun, NP   4 mg at 02/24/14 1152  . mupirocin ointment (BACTROBAN) 2 % 1 application  1 application Nasal BID Phillips Grout, MD   1 application at 40/10/27 1024  . ondansetron (ZOFRAN) tablet 4 mg  4 mg Oral Q6H PRN Phillips Grout, MD       Or  . ondansetron [facility]) injection 4 mg  4 mg Intravenous Q6H PRN Phillips Grout, MD      . piperacillin-tazobactam (ZOSYN) IVPB 3.375 g  3.375 g Intravenous 3 times per day Tomasita Morrow, RPH   3.375 g at 02/24/14 [id number]    Allergies as of 02/23/2014 - Review Complete 02/23/2014  Allergen Reaction Noted  . Amoxicillin-pot clavulanate Diarrhea 07/31/2009  . Ciprofloxacin Other (See Comments) 06/25/2009  . Duragesic disc transdermal system [alcohol-fentanyl] Itching 09/04/2011  . Metformin Diarrhea 08/26/2007  . Sulfamethoxazole-trimethoprim Swelling 07/01/2010  . Tetracycline hcl Itching 08/26/2007  . Tramadol Other (See Comments) 04/17/2011  . Azithromycin Rash 08/26/2007    Family History  Problem Relation Age of Onset  . Diabetes Other   . Clotting disorder Other   . Colitis Other   . Crohn's disease Other   . Heart disease Sister     History   Social History  . Marital Status: Married    Spouse Name: N/A    Number of Children: N/A  . Years of Education: N/A   Occupational History  . diabled    Social History Main Topics  . Smoking status: Never Smoker   . Smokeless tobacco: Not on file  . Alcohol Use: No  . Drug Use: No  . Sexual Activity: Not Currently   Other Topics Concern  . Not on file   Social History  Narrative   Suicidal Attempt- 04/2000    Review of Systems: ROS negative except as outlined in HPI.  Physical Exam: Vital signs in last 24 hours: Temp:  [97.4 F (36.3 C)-98.5 F (36.9 C)] 97.8 F (36.6 C) (05/23 0404) Pulse Rate:  [88-121] 104 (05/23 0404) Resp:  [8-30] 16 (05/23 0404) BP: (103-133)/(54-82) 133/76 mmHg (05/23 0404) SpO2:  [90 %-100 %] 93 % (05/23 0404) Weight:  [182 lb 1.6 oz (82.6 kg)] 182 lb 1.6 oz (82.6 kg) (05/23 0404) Last BM Date: 02/24/14 General:   Alert,  Well-developed, older WF, appears older than stated age, well-nourished, pleasant and cooperative in NAD, Very HOH Head:  Normocephalic and atraumatic. Eyes:  Sclera clear, no icterus. Conjunctiva pink. Ears:  Normal auditory acuity. Nose:  No deformity, discharge, or lesions. Mouth:  No deformity or lesions.   Neck:  Supple; no masses or thyromegaly. Lungs:  Clear throughout to auscultation.   No wheezes, crackles, or rhonchi. Heart:  Regular rate and rhythm; no murmurs, clicks, rubs,  or gallops. Abdomen:  Soft, multiple incisional scars, mild tenderness rather diffusely full feeling, and full around umbilicus BS active,nonpalp mass spleen is enlarged   Rectal:  Deferred  Msk:  Symmetrical without gross deformities. . Pulses:  Normal pulses noted. Extremities:  Without clubbing or edema. Neurologic:  Alert and  oriented x4;  grossly normal neurologically. Skin:  Intact without significant lesions ,scattered ecchymosis. Psych:  Alert and cooperative. Normal mood and affect.  Intake/Output from previous day: 05/22 0701 - 05/23 0700 In: 100 [I.V.:100] Out: -  Intake/Output this shift:    Lab Results:  Recent Labs  02/23/14 1334 02/24/14 0430  WBC 5.0 3.9*  HGB 12.7 11.2*  HCT 37.2 32.2*  PLT 49* 38*   BMET  Recent Labs  02/23/14 1334 02/24/14 0430  NA 129* 137  K 3.5* 3.7  CL 94* 102  CO2 21 24  GLUCOSE 153* 159*  BUN 34* 31*  CREATININE 1.36* 1.09  CALCIUM 8.7 8.7    LFT  Recent Labs  02/23/14 1334  PROT 6.9  ALBUMIN 2.2*  AST 42*  ALT 25  ALKPHOS 85  BILITOT 2.6*   PT/INR  Recent Labs  02/23/14 1723 02/24/14 0430  LABPROT 18.4* 17.4*  INR 1.58* 1.47     MPRESSION:  #1 68 yo female with long hx of Crohn's ileocolitis with several prior resections-disease had been quiescent for several years, and on no meds. Now with 2 month hx of abdominal discomfort, looser stools and now with  Bloody BMs past couple days. CT is consistent with Crohn's ileitis vs other inflammatory process of TI. #2 mild anemia secondary to above #3 Decompensated Cirrhosis /? NASH-with ascites and severe thrombocytopenia, coagulopathy #4 IDDM #5 Fibromyalgia #6 obesity #7 HTN #8 Depression #9 remote hx PUD   PLAN: #1 Continue Abx until GI pathogen panel returns #2 clear liquid diet #3 start solumedrol if pathogen panel negative #4 will discuss colonoscopy Will follow with you   Amy Genia Harold  02/24/2014, 12:07 PM     Attending physician's note   I have taken a history, examined the patient and reviewed the chart. I agree with the Advanced Practitioner's note, impression and recommendations. Pt previously followed by Dr. Olevia Perches but has not been seen by her since 2010 now with abdominal pain, loose stools and hematochezia. Presumed flare of Crohn's. R/O infection causes. Decompensated cirrhosis with ascites, thrombocytopenia and coagulopathy. Continue antibiotics. Await GI pathogen panel. Solumedrol if GI pathogen panel is negative.   Ladene Artist, MD [facility]

## 2014-02-24 NOTE — Progress Notes (Signed)
INITIAL NUTRITION ASSESSMENT  DOCUMENTATION CODES Per approved criteria  -Obesity Unspecified   INTERVENTION: Resource Breeze po TID, each supplement provides 250 kcal and 9 grams of protein RD to follow for nutrition care plan  NUTRITION DIAGNOSIS: Inadequate oral intake related to acute colitis as evidenced by chart review  Goal: Pt to meet >/= 90% of their estimated nutrition needs   Monitor:  PO & supplemental intake, weight, labs, I/O's  Reason for Assessment: Malnutrition Screening Tool Report  68 y.o. female  Admitting Dx: Colitis, acute  ASSESSMENT: 68 yo female h/o nonalcoholic cirrhosis, fibromyalgia comes in with 2 days of diarrhea was initially bloody red, but is now more melanotic with associated llq abd pain. No fevers. Has some n/v x 1 several days ago but none recently. He ct abd shows colitis with possible underlying mass. She has not been eating or drinking well.   GI note reviewed 5/23.  CT is consistent with Crohn's ileitis vs other inflammatory process of TI.  RD unable to obtain nutrition hx at this time.  Per admission nutrition screen, pt reported eating poorly because of a decreased appetite and recent unintentional weight loss.  Pt currently on Clear Liquids.  Per wt readings below, wt had trended up.  Would benefit from addition of nutrition supplements.  RD to order.  Height: Ht Readings from Last 1 Encounters:  02/24/14 5' 2.99" (1.6 m)    Weight: Wt Readings from Last 1 Encounters:  02/24/14 182 lb 1.6 oz (82.6 kg)    Ideal Body Weight: 115 lb  % Ideal Body Weight: 158%  Wt Readings from Last 10 Encounters:  02/24/14 182 lb 1.6 oz (82.6 kg)  02/20/14 182 lb (82.555 kg)  12/19/13 173 lb (78.472 kg)  11/02/13 183 lb (83.008 kg)  10/29/13 180 lb (81.647 kg)  10/25/13 186 lb (84.369 kg)  10/12/13 177 lb 6 oz (80.457 kg)  08/03/13 175 lb (79.379 kg)  07/27/13 173 lb (78.472 kg)  06/13/13 175 lb (79.379 kg)    Usual Body Weight: 173  lb  % Usual Body Weight: 105%  BMI:  Body mass index is 32.27 kg/(m^2).  Estimated Nutritional Needs: Kcal: 1800-2000 Protein: 90-100 gm Fluid: 1.8-2.0 L  Skin: Intact  Diet Order: Clear Liquid  EDUCATION NEEDS: -No education needs identified at this time   Intake/Output Summary (Last 24 hours) at 02/24/14 1328 Last data filed at 02/24/14 0055  Gross per 24 hour  Intake    100 ml  Output      0 ml  Net    100 ml    Labs:   Recent Labs Lab 02/20/14 1701 02/23/14 1334 02/24/14 0430  NA 135 129* 137  K 2.8* 3.5* 3.7  CL 106 94* 102  CO2 23 21 24   BUN 10 34* 31*  CREATININE 0.7 1.36* 1.09  CALCIUM 8.8 8.7 8.7  GLUCOSE 81 153* 159*    CBG (last 3)   Recent Labs  02/24/14 1224  GLUCAP 120*    Scheduled Meds: . cholecalciferol  1,000 Units Oral Daily  . citalopram  10 mg Oral Daily  . insulin aspart  0-9 Units Subcutaneous TID WC  . mupirocin ointment  1 application Nasal BID  . piperacillin-tazobactam (ZOSYN)  IV  3.375 g Intravenous 3 times per day    Continuous Infusions: . sodium chloride 75 mL/hr at 02/24/14 1151    Past Medical History  Diagnosis Date  . Personal history of peptic ulcer disease   . Ventral hernia,  unspecified, without mention of obstruction or gangrene   . Personal history of diseases of blood and blood-forming organs   . Degeneration of cervical intervertebral disc   . Regional enteritis of large intestine   . Unspecified constipation   . Unspecified intestinal obstruction   . Routine general medical examination at a health care facility   . Rosacea   . Cirrhosis of liver without mention of alcohol   . Type II or unspecified type diabetes mellitus without mention of complication, not stated as uncontrolled   . Unspecified essential hypertension   . Subarachnoid hemorrhage   . Depressive disorder, not elsewhere classified   . Iron deficiency anemia, unspecified   . Lumbago   . Unspecified hearing loss   . Urinary  tract infection, site not specified   . Morbid obesity   . Personal history of unspecified digestive disease   . Personal history of urinary calculi   . Family history of diabetes mellitus     Past Surgical History  Procedure Laterality Date  . Cholecystectomy    . Abdominal hysterectomy    . Partial colectomy    . Breast cyst excision    . Tibia fracture surgery      right  . Liver biopsy      Arthur Holms, RD, LDN Pager #: [phone] After-Hours Pager #: [phone]

## 2014-02-24 NOTE — Progress Notes (Signed)
ANTIBIOTIC CONSULT NOTE - INITIAL  Pharmacy Consult for Zosyn  Indication:  Intra-abdominal infection  Allergies  Allergen Reactions  . Amoxicillin-Pot Clavulanate Diarrhea    REACTION: diarrhea  . Ciprofloxacin Other (See Comments)    REACTION: swelling of LE  . Duragesic Disc Transdermal System [Alcohol-Fentanyl] Itching    Itching from patch  . Metformin Diarrhea  . Sulfamethoxazole-Trimethoprim Swelling    REACTION: leg edema  . Tetracycline Hcl Itching  . Tramadol Other (See Comments)    Headache  . Azithromycin Rash   Patient Measurements: Weight = 82.6 kg (02/20/2014)  Vital Signs: Temp: 97.4 F (36.3 C) (05/22 1506) Temp src: Oral (05/22 1506) BP: 107/66 mmHg (05/23 0145) Pulse Rate: 102 (05/23 0145)  Labs:  Recent Labs  02/23/14 1334  WBC 5.0  HGB 12.7  PLT 49*  CREATININE 1.36*   Microbiology: No results found for this or any previous visit (from the past 720 hour(s)).  Medical History: Past Medical History  Diagnosis Date  . Personal history of peptic ulcer disease   . Ventral hernia, unspecified, without mention of obstruction or gangrene   . Personal history of diseases of blood and blood-forming organs   . Degeneration of cervical intervertebral disc   . Regional enteritis of large intestine   . Unspecified constipation   . Unspecified intestinal obstruction   . Routine general medical examination at a health care facility   . Rosacea   . Cirrhosis of liver without mention of alcohol   . Type II or unspecified type diabetes mellitus without mention of complication, not stated as uncontrolled   . Unspecified essential hypertension   . Subarachnoid hemorrhage   . Depressive disorder, not elsewhere classified   . Iron deficiency anemia, unspecified   . Lumbago   . Unspecified hearing loss   . Urinary tract infection, site not specified   . Morbid obesity   . Personal history of unspecified digestive disease   . Personal history of urinary  calculi   . Family history of diabetes mellitus    Medications:  Anti-infectives   Start     Dose/Rate Route Frequency Ordered Stop   02/23/14 2315  piperacillin-tazobactam (ZOSYN) IVPB 3.375 g     3.375 g 100 mL/hr over 30 Minutes Intravenous  Once 02/23/14 2304 02/24/14 0055     Assessment: 68yo female comes in with h/o cirrhosis and complaints of ongoing diarrhea.  She has had a CT that reveals colitis with possible mass.  We have been asked to dose her IV Zosyn for possible abdominal infection.  She has a creatinine of 1.36 which has bumped significantly from 5/19.  Her estimated creatinine clearance is 40 ml/min.  She has received one dose of 3.375gm IV Zosyn at ~ 23:00 PM last night without noted complications.  She has multiple stated allergies to antibiotics.  Goal of Therapy:  Therapeutic response to IV antibiotics  Plan:  1.  Zosyn 3.375 gm IV every 8 hours to be delivered over 4 hours. 2.  Monitor renal function and adjust as needed.  Rober Minion, PharmD., MS Clinical Pharmacist Pager:  [phone] Thank you for allowing pharmacy to be part of this patients care team. 02/24/2014,2:29 AM

## 2014-02-24 NOTE — Progress Notes (Signed)
  Pt admitted to the unit. Pt is stable, alert and oriented per baseline. Oriented to room, staff, and call bell. Educated to call for any assistance. Bed in lowest position, call bell within reach- will continue to monitor. 

## 2014-02-25 DIAGNOSIS — K5080 Crohn's disease of both small and large intestine without complications: Principal | ICD-10-CM

## 2014-02-25 LAB — GLUCOSE, CAPILLARY
Glucose-Capillary: 177 mg/dL — ABNORMAL HIGH (ref 70–99)
Glucose-Capillary: 226 mg/dL — ABNORMAL HIGH (ref 70–99)
Glucose-Capillary: 383 mg/dL — ABNORMAL HIGH (ref 70–99)
Glucose-Capillary: 337 mg/dL — ABNORMAL HIGH (ref 70–99)

## 2014-02-25 LAB — CBC
HEMATOCRIT: 28.6 % — AB (ref 36.0–46.0)
HEMOGLOBIN: 9.9 g/dL — AB (ref 12.0–15.0)
MCH: 31.2 pg (ref 26.0–34.0)
MCHC: 34.6 g/dL (ref 30.0–36.0)
MCV: 90.2 fL (ref 78.0–100.0)
Platelets: 26 10*3/uL — CL (ref 150–400)
RBC: 3.17 MIL/uL — ABNORMAL LOW (ref 3.87–5.11)
RDW: 18.2 % — ABNORMAL HIGH (ref 11.5–15.5)
WBC: 2.2 10*3/uL — ABNORMAL LOW (ref 4.0–10.5)

## 2014-02-25 LAB — COMPREHENSIVE METABOLIC PANEL
ALK PHOS: 71 U/L (ref 39–117)
ALT: 22 U/L (ref 0–35)
AST: 34 U/L (ref 0–37)
Albumin: 1.8 g/dL — ABNORMAL LOW (ref 3.5–5.2)
BILIRUBIN TOTAL: 1.6 mg/dL — AB (ref 0.3–1.2)
BUN: 19 mg/dL (ref 6–23)
CHLORIDE: 104 meq/L (ref 96–112)
CO2: 26 meq/L (ref 19–32)
Calcium: 8.3 mg/dL — ABNORMAL LOW (ref 8.4–10.5)
Creatinine, Ser: 0.99 mg/dL (ref 0.50–1.10)
GFR, EST AFRICAN AMERICAN: 67 mL/min — AB (ref >90)
GFR, EST NON AFRICAN AMERICAN: 58 mL/min — AB (ref >90)
Glucose, Bld: 195 mg/dL — ABNORMAL HIGH (ref 70–99)
POTASSIUM: 3.2 meq/L — AB (ref 3.7–5.3)
Sodium: 137 mEq/L (ref 137–147)
TOTAL PROTEIN: 5.9 g/dL — AB (ref 6.0–8.3)

## 2014-02-25 MED ORDER — POTASSIUM CHLORIDE CRYS ER 20 MEQ PO TBCR
40.0000 meq | EXTENDED_RELEASE_TABLET | Freq: Four times a day (QID) | ORAL | Status: AC
  Administered 2014-02-25 (×2): 40 meq via ORAL
  Filled 2014-02-25 (×2): qty 2

## 2014-02-25 MED ORDER — METHYLPREDNISOLONE SODIUM SUCC 40 MG IJ SOLR
40.0000 mg | Freq: Every day | INTRAMUSCULAR | Status: DC
  Administered 2014-02-25 – 2014-02-28 (×4): 40 mg via INTRAVENOUS
  Filled 2014-02-25 (×5): qty 1

## 2014-02-25 MED ORDER — PANTOPRAZOLE SODIUM 40 MG PO TBEC
40.0000 mg | DELAYED_RELEASE_TABLET | Freq: Every day | ORAL | Status: DC
  Administered 2014-02-26 – 2014-02-27 (×2): 40 mg via ORAL
  Filled 2014-02-25 (×2): qty 1

## 2014-02-25 MED ORDER — MAGNESIUM SULFATE 40 MG/ML IJ SOLN
2.0000 g | Freq: Once | INTRAMUSCULAR | Status: AC
  Administered 2014-02-25: 2 g via INTRAVENOUS
  Filled 2014-02-25: qty 50

## 2014-02-25 NOTE — Progress Notes (Signed)
CRITICAL VALUE ALERT  Critical value received:  Platelet 26  Date of notification:  02/25/14 Time of notification:  9:03 AM   Critical value read back:yes  Nurse who received alert:  Barrie Lyme   MD notified (1st page):  Elmahi   Time of first page:  9:03 AM   MD notified (2nd page):  Time of second page:  Responding MD:  Hartford Poli  Time MD responded:  9:05 AM

## 2014-02-25 NOTE — Progress Notes (Signed)
   Patient ID: [name], female   DOB: [birth year], 68 y.o.   MRN: [id number]  Progress Note  Subjective: Tolerating clear liquids, pain about the same, still having diarrhea which is dark. GI pathogen panel still pending  Objective:  Vital signs in last 24 hours: Temp:  [98 F (36.7 C)-98.6 F (37 C)] 98.6 F (37 C) (05/24 0535) Pulse Rate:  [87-102] 87 (05/24 0535) Resp:  [14-16] 16 (05/24 0535) BP: (101-121)/(62-73) 107/67 mmHg (05/24 0535) SpO2:  [90 %-95 %] 95 % (05/24 0535) Weight:  [183 lb (83.008 kg)] 183 lb (83.008 kg) (05/24 0500) Last BM Date: 02/25/14 General:   Alert, well-developed, chronically ill, HOH in NAD Heart:  Regular rate and rhythm; no murmurs Pulm;clear  Abdomen:  Soft, large tender across mid abdomen and nondistended. Normal bowel sounds, without guarding, and without rebound.   Extremities:  Without edema. Neurologic:  Alert and  oriented x4;  grossly normal neurologically. Psych:  Alert and cooperative. Normal mood and affect.  Intake/Output from previous day: 05/23 0701 - 05/24 0700 In: 444 [P.O.:444] Out: -  Intake/Output this shift:    Lab Results:  Recent Labs  02/23/14 1334 02/24/14 0430 02/25/14 0740  WBC 5.0 3.9* 2.2*  HGB 12.7 11.2* 9.9*  HCT 37.2 32.2* 28.6*  PLT 49* 38* 26*   BMET  Recent Labs  02/23/14 1334 02/24/14 0430 02/25/14 0740  NA 129* 137 137  K 3.5* 3.7 3.2*  CL 94* 102 104  CO2 21 24 26   GLUCOSE 153* 159* 195*  BUN 34* 31* 19  CREATININE 1.36* 1.09 0.99  CALCIUM 8.7 8.7 8.3*   LFT  Recent Labs  02/25/14 0740  PROT 5.9*  ALBUMIN 1.8*  AST 34  ALT 22  ALKPHOS 71  BILITOT 1.6*   PT/INR  Recent Labs  02/23/14 1723 02/24/14 0430  LABPROT 18.4* 17.4*  INR 1.58* 1.47    Assessment / Plan: #1 68 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   Advance to full liquids.  Stop Zosyn if pathogen panel negative #3 Anemia-drifting hgb with hydration-cover with PPI, serial hgbs   Principal Problem:   Colitis, acute Active Problems:   DEHYDRATION (VOLUME DEPLETION)   THROMBOCYTOPENIA   CROHN'S DISEASE-LARGE INTESTINE   CIRRHOSIS   Fibromyalgia syndrome   Bloody diarrhea   Renal insufficiency, mild   Abdominal pain, acute, left lower quadrant   GI bleed   Acute colitis    LOS: 2 days   [name]  02/25/2014, 11:48 AM     Attending physician's note   I have taken an interval history, reviewed the chart and examined the patient. I agree with the Advanced Practitioner's note, impression and recommendations. Begin Solumedrol for Crohn's ileocolitis flare while awaiting GI pathogen panel. Monitor drifting Hb-likely related to hydration. 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   Pricilla Riffle. Fuller Plan, MD [facility]

## 2014-02-25 NOTE — Progress Notes (Signed)
PATIENT DETAILS Name: [name] Age: 68 y.o. Sex: female Date of Birth: 1946/06/06 Admit Date: 02/23/2014 Admitting Physician Phillips Grout, MD YCX:[id number] Plotnikov, MD  Subjective: Sleepy no complaints, had already to bowel movement this morning. Stool culture/GI pathogen panel pending. Continue current management.  Assessment/Plan: Principal Problem: Acute Distal Ileitis -has hx of Crohn's disease-not on any maintenance medications. Not sure at this time if this is a Crohn's flare or Infection. -continue with empiric Zosyn, await Stool C Diff PCR and other stool studies -Consulted GI -await their recommendations  Acute Renal Failure -likely secondary to diarrhea and diarrhea. -This is resolved with hydration with IV fluids.  Liver Cirrhosis -reviewed prior records-Hx of NASH -appears stable at present -hold Lactulose/Aldactone/Demadex for now-as with diarrhea  Thrombocytopenia -Chronic, secondary to liver cirrhosis. -No evidence of bleeding, follow closely.    DEHYDRATION (VOLUME DEPLETION) -better-c/w IVF  DM -start SSI-monitor CBG's  Anxiety/Fibromyalgia -c/w Wellbutrin, Celexa and Gabapentin -start prn Xanax  Disposition: Remain inpatient  DVT Prophylaxis: SCD's  Code Status: Full code   Family Communication None at bedside  Procedures:  None  CONSULTS:  GI  Time spent 40 minutes-which includes 50% of the time with face-to-face with patient/ family and coordinating care related to the above assessment and plan.    MEDICATIONS: Scheduled Meds: . cholecalciferol  1,000 Units Oral Daily  . citalopram  10 mg Oral Daily  . feeding supplement (RESOURCE BREEZE)  1 Container Oral TID BM  . insulin aspart  0-9 Units Subcutaneous TID WC  . mupirocin ointment  1 application Nasal BID  . piperacillin-tazobactam (ZOSYN)  IV  3.375 g Intravenous 3 times per day   Continuous Infusions: . sodium chloride 50 mL/hr at 02/24/14 1349   PRN  Meds:.ALPRAZolam, gabapentin, morphine injection, ondansetron (ZOFRAN) IV, ondansetron  Antibiotics: Anti-infectives   Start     Dose/Rate Route Frequency Ordered Stop   02/24/14 0600  piperacillin-tazobactam (ZOSYN) IVPB 3.375 g     3.375 g 12.5 mL/hr over 240 Minutes Intravenous 3 times per day 02/24/14 0237     02/23/14 2315  piperacillin-tazobactam (ZOSYN) IVPB 3.375 g     3.375 g 100 mL/hr over 30 Minutes Intravenous  Once 02/23/14 2304 02/24/14 0055       PHYSICAL EXAM: Vital signs in last 24 hours: Filed Vitals:   02/24/14 1252 02/24/14 1951 02/25/14 0500 02/25/14 0535  BP: 101/62 121/73  107/67  Pulse: 102 98  87  Temp: 98 F (36.7 C) 98.2 F (36.8 C)  98.6 F (37 C)  TempSrc: Oral Oral  Oral  Resp: 14 16  16   Height:      Weight:   83.008 kg (183 lb)   SpO2: 90% 94%  95%    Weight change: 0.408 kg (14.4 oz) Filed Weights   02/24/14 0202 02/24/14 0404 02/25/14 0500  Weight: 82.6 kg (182 lb 1.6 oz) 82.6 kg (182 lb 1.6 oz) 83.008 kg (183 lb)   Body mass index is 32.43 kg/(m^2).   Gen Exam: Awake and alert with clear speech.   Neck: Supple, No JVD.   Chest: B/L Clear.   CVS: S1 S2 Regular, no murmurs.  Abdomen: soft, BS +, Mildly tender in the mid abdomen, non distended.  Extremities: mild bilateral edema, lower extremities warm to touch. Neurologic: Non Focal.   Skin: No Rash.   Wounds: N/A.   Intake/Output from previous day:  Intake/Output Summary (Last 24 hours) at 02/25/14 1110 Last data  filed at 02/24/14 1730  Gross per 24 hour  Intake    444 ml  Output      0 ml  Net    444 ml     LAB RESULTS: CBC  Recent Labs Lab 02/20/14 1701 02/23/14 1334 02/24/14 0430 02/25/14 0740  WBC 4.4 5.0 3.9* 2.2*  HGB 12.9 12.7 11.2* 9.9*  HCT 38.1 37.2 32.2* 28.6*  PLT 48.0* 49* 38* 26*  MCV 92.3 90.1 89.2 90.2  MCH  --  30.8 31.0 31.2  MCHC 33.9 34.1 34.8 34.6  RDW 19.7* 18.8* 18.4* 18.2*  LYMPHSABS 1.5  --   --   --   MONOABS 0.4  --   --   --     EOSABS 0.4  --   --   --   BASOSABS 0.0  --   --   --     Chemistries   Recent Labs Lab 02/20/14 1701 02/23/14 1334 02/24/14 0430 02/25/14 0740  NA 135 129* 137 137  K 2.8* 3.5* 3.7 3.2*  CL 106 94* 102 104  CO2 23 21 24 26   GLUCOSE 81 153* 159* 195*  BUN 10 34* 31* 19  CREATININE 0.7 1.36* 1.09 0.99  CALCIUM 8.8 8.7 8.7 8.3*    CBG:  Recent Labs Lab 02/24/14 1224 02/24/14 1643 02/24/14 1943 02/25/14 0820  GLUCAP 120* 228* 277* 177*    GFR Estimated Creatinine Clearance: 57 ml/min (by C-G formula based on Cr of 0.99).  Coagulation profile  Recent Labs Lab 02/20/14 1701 02/23/14 1723 02/24/14 0430  INR 1.3* 1.58* 1.47    Cardiac Enzymes No results found for this basename: CK, CKMB, TROPONINI, MYOGLOBIN,  in the last 168 hours  No components found with this basename: POCBNP,  No results found for this basename: DDIMER,  in the last 72 hours No results found for this basename: HGBA1C,  in the last 72 hours No results found for this basename: CHOL, HDL, LDLCALC, TRIG, CHOLHDL, LDLDIRECT,  in the last 72 hours No results found for this basename: TSH, T4TOTAL, FREET3, T3FREE, THYROIDAB,  in the last 72 hours No results found for this basename: VITAMINB12, FOLATE, FERRITIN, TIBC, IRON, RETICCTPCT,  in the last 72 hours  Recent Labs  02/23/14 1723  LIPASE 30    Urine Studies No results found for this basename: UACOL, UAPR, USPG, UPH, UTP, UGL, UKET, UBIL, UHGB, UNIT, UROB, ULEU, UEPI, UWBC, URBC, UBAC, CAST, CRYS, UCOM, BILUA,  in the last 72 hours  MICROBIOLOGY: Recent Results (from the past 240 hour(s))  CLOSTRIDIUM DIFFICILE BY PCR     Status: None   Collection Time    02/23/14 11:07 PM      Result Value Ref Range Status   C difficile by pcr NEGATIVE  NEGATIVE Final  MRSA PCR SCREENING     Status: None   Collection Time    02/24/14  2:26 AM      Result Value Ref Range Status   MRSA by PCR NEGATIVE  NEGATIVE Final   Comment:            The  GeneXpert MRSA Assay (FDA     approved for NASAL specimens     only), is one component of a     comprehensive MRSA colonization     surveillance program. It is not     intended to diagnose MRSA     infection nor to guide or     monitor treatment for  MRSA infections.    RADIOLOGY STUDIES/RESULTS: Ct Abdomen Pelvis Wo Contrast  02/23/2014   CLINICAL DATA:  Left-sided abdominal pain.  EXAM: CT ABDOMEN AND PELVIS WITHOUT CONTRAST  TECHNIQUE: Multidetector CT imaging of the abdomen and pelvis was performed following the standard protocol without IV contrast.  COMPARISON:  CT of the abdomen and pelvis from 08/27/2010  FINDINGS: A few calcified granulomata are seen at the lung bases. Dense calcification is noted at the mitral valve.  Small volume ascites is noted about the liver and spleen. Mild mesenteric edema is noted.  Diffuse cirrhotic change is noted, with extensive nodularity of the liver. The spleen is enlarged, measuring 17.5 cm in length. The patient is status post cholecystectomy, with clips noted at the gallbladder fossa. The pancreas and adrenal glands are unremarkable in appearance.  Prominent nonobstructing bilateral renal stones are seen, measuring up to 6 mm in size. There is no evidence of hydronephrosis. A 2.5 cm cyst is noted at the upper pole of the left kidney. Bilateral renal pelvicaliectasis remains within normal limits. No obstructing ureteral stones are seen. Mild nonspecific perinephric stranding is noted bilaterally.  Trace free fluid is noted tracking about small bowel loops. There is segmental wall thickening of a short segment of distal ileum at the lower abdomen, measuring up to 7 cm in length. This could reflect an infectious process, though malignancy cannot be entirely excluded. Would correlate with the patient's symptoms and consider capsule endoscopy for further evaluation.  Trace postoperative fluid is noted along the midline abdomen.  The stomach is within normal  limits. No acute vascular abnormalities are seen. Minimal calcification is seen along the abdominal aorta. Mild calcification is also seen along the portal venous system.  The appendix is not definitely seen. The terminal ileum appears mildly thickened, though this may be reactive in nature due to mild omental inflammation. The colon is grossly unremarkable in appearance.  The bladder is moderately distended and grossly unremarkable. The patient is status post hysterectomy. No suspicious adnexal masses are seen. No inguinal lymphadenopathy is seen.  No acute osseous abnormalities are identified.  IMPRESSION: 1. Segmental wall thickening of a short segment of distal ileum at the lower abdomen, measuring up to 7 cm in length. This could reflect an infectious process, though malignancy cannot be entirely excluded. Would correlate with the patient's symptoms and consider capsule endoscopy for further evaluation, when and as deemed clinically appropriate. 2. Terminal ileum appears mildly thickened, though this may be reactive in nature due to mild underlying omental inflammation. 3. Small volume ascites about the liver and spleen, with mild mesenteric edema. 4. Diffuse cirrhotic change noted; splenomegaly seen. 5. Prominent nonobstructing bilateral renal stones again noted, measuring up to 6 mm in size. Left renal cyst seen. 6. Trace focal postoperative fluid noted along the midline abdomen. 7. Dense calcification noted at the mitral valve.   Electronically Signed   By: Garald Balding M.D.   On: 02/23/2014 22:14   Dg Chest 2 View  02/23/2014   CLINICAL DATA:  Cough.  EXAM: CHEST  2 VIEW  COMPARISON:  10/29/2013 and 06/24/2014 as well as chest CT 10/29/2013  FINDINGS: The lungs are adequately inflated without focal consolidation or effusion. There are a few small nodules over the right mid to upper lung compatible calcified granulomas on as seen on chest CT. There is minimal prominence of the perihilar markings  suggesting mild vascular congestion. No effusion. There is mild stable cardiomegaly. There are degenerative changes of the spine  with a stable mild compression fracture at the thoracolumbar junction.  IMPRESSION: Suggestion of mild vascular congestion with mild stable cardiomegaly.  Stable compression fracture over the thoracolumbar junction.   Electronically Signed   By: Marin Olp M.D.   On: 02/23/2014 16:06    Verlee Monte, MD  [facility] Pager:[phone]  If 7PM-7AM, please contact night-coverage [email or web address] Password [email or web address] 02/25/2014, 11:10 AM   LOS: 2 days   **Disclaimer: This note may have been dictated with voice recognition software. Similar sounding words can inadvertently be transcribed and this note may contain transcription errors which may not have been corrected upon publication of note.**

## 2014-02-26 ENCOUNTER — Encounter: Payer: Self-pay | Admitting: Internal Medicine

## 2014-02-26 DIAGNOSIS — E119 Type 2 diabetes mellitus without complications: Secondary | ICD-10-CM

## 2014-02-26 LAB — CBC
HCT: 30.4 % — ABNORMAL LOW (ref 36.0–46.0)
Hemoglobin: 10.2 g/dL — ABNORMAL LOW (ref 12.0–15.0)
MCH: 30.8 pg (ref 26.0–34.0)
MCHC: 33.6 g/dL (ref 30.0–36.0)
MCV: 91.8 fL (ref 78.0–100.0)
PLATELETS: 22 10*3/uL — AB (ref 150–400)
RBC: 3.31 MIL/uL — ABNORMAL LOW (ref 3.87–5.11)
RDW: 18.2 % — ABNORMAL HIGH (ref 11.5–15.5)
WBC: 1.8 10*3/uL — ABNORMAL LOW (ref 4.0–10.5)

## 2014-02-26 LAB — BASIC METABOLIC PANEL
BUN: 15 mg/dL (ref 6–23)
CALCIUM: 8.1 mg/dL — AB (ref 8.4–10.5)
CO2: 22 mEq/L (ref 19–32)
CREATININE: 0.82 mg/dL (ref 0.50–1.10)
Chloride: 104 mEq/L (ref 96–112)
GFR calc Af Amer: 85 mL/min — ABNORMAL LOW (ref >90)
GFR calc non Af Amer: 73 mL/min — ABNORMAL LOW (ref >90)
GLUCOSE: 475 mg/dL — AB (ref 70–99)
Potassium: 4 mEq/L (ref 3.7–5.3)
Sodium: 135 mEq/L — ABNORMAL LOW (ref 137–147)

## 2014-02-26 LAB — GLUCOSE, CAPILLARY
GLUCOSE-CAPILLARY: 435 mg/dL — AB (ref 70–99)
GLUCOSE-CAPILLARY: 261 mg/dL — AB (ref 70–99)
Glucose-Capillary: 335 mg/dL — ABNORMAL HIGH (ref 70–99)
Glucose-Capillary: 323 mg/dL — ABNORMAL HIGH (ref 70–99)
Glucose-Capillary: 529 mg/dL — ABNORMAL HIGH (ref 70–99)

## 2014-02-26 LAB — MAGNESIUM: Magnesium: 1.6 mg/dL (ref 1.5–2.5)

## 2014-02-26 MED ORDER — MAGNESIUM SULFATE 40 MG/ML IJ SOLN
2.0000 g | Freq: Once | INTRAMUSCULAR | Status: AC
  Administered 2014-02-26: 2 g via INTRAVENOUS
  Filled 2014-02-26: qty 50

## 2014-02-26 MED ORDER — INSULIN ASPART 100 UNIT/ML ~~LOC~~ SOLN
20.0000 [IU] | Freq: Once | SUBCUTANEOUS | Status: AC
  Administered 2014-02-26: 20 [IU] via SUBCUTANEOUS

## 2014-02-26 MED ORDER — INSULIN GLARGINE 100 UNIT/ML ~~LOC~~ SOLN
15.0000 [IU] | Freq: Every day | SUBCUTANEOUS | Status: DC
  Administered 2014-02-26: 15 [IU] via SUBCUTANEOUS
  Filled 2014-02-26 (×2): qty 0.15

## 2014-02-26 MED ORDER — OXYCODONE HCL 5 MG PO TABS
5.0000 mg | ORAL_TABLET | Freq: Four times a day (QID) | ORAL | Status: DC | PRN
  Administered 2014-02-28 – 2014-03-01 (×3): 5 mg via ORAL
  Filled 2014-02-26 (×3): qty 1

## 2014-02-26 MED ORDER — INSULIN ASPART 100 UNIT/ML ~~LOC~~ SOLN
0.0000 [IU] | Freq: Three times a day (TID) | SUBCUTANEOUS | Status: DC
  Administered 2014-02-26: 11 [IU] via SUBCUTANEOUS
  Administered 2014-02-26: 8 [IU] via SUBCUTANEOUS

## 2014-02-26 NOTE — Progress Notes (Signed)
CBG 529 with no insulin due tonight.  Message sent to NP on call.

## 2014-02-26 NOTE — Progress Notes (Signed)
    Progress Note   Subjective   Unchanged bloody diarrhea and abdominal pain.   Objective  Vital signs in last 24 hours: Temp:  [97.7 F (36.5 C)-98.1 F (36.7 C)] 97.7 F (36.5 C) (05/25 0436) Pulse Rate:  [82-92] 82 (05/25 0436) Resp:  [18] 18 (05/25 0436) BP: (106-149)/(63-81) 149/81 mmHg (05/25 0436) SpO2:  [92 %-94 %] 93 % (05/25 0436) Weight:  [183 lb (83.008 kg)] 183 lb (83.008 kg) (05/25 0436) Last BM Date: 02/25/14  General:   Alert, well-developed, female in NAD Heart:  Regular rate and rhythm; no murmurs Abdomen:  Soft, mild diffuse tenderness and nondistended. Normal bowel sounds, without guarding, and without rebound.   Extremities:  Without edema. Neurologic:  Alert and  oriented x4;  grossly normal neurologically. Psych:  Alert and cooperative. Normal mood and affect.  Intake/Output from previous day: 05/24 0701 - 05/25 0700 In: 888 [P.O.:888] Out: -  Intake/Output this shift:    Lab Results:  Recent Labs  02/24/14 0430 02/25/14 0740 02/26/14 0504  WBC 3.9* 2.2* 1.8*  HGB 11.2* 9.9* 10.2*  HCT 32.2* 28.6* 30.4*  PLT 38* 26* 22*   BMET  Recent Labs  02/24/14 0430 02/25/14 0740 02/26/14 0504  NA 137 137 135*  K 3.7 3.2* 4.0  CL 102 104 104  CO2 24 26 22   GLUCOSE 159* 195* 475*  BUN 31* 19 15  CREATININE 1.09 0.99 0.82  CALCIUM 8.7 8.3* 8.1*   LFT  Recent Labs  02/25/14 0740  PROT 5.9*  ALBUMIN 1.8*  AST 34  ALT 22  ALKPHOS 71  BILITOT 1.6*   PT/INR  Recent Labs  02/23/14 1723 02/24/14 0430  LABPROT 18.4* 17.4*  INR 1.58* 1.47   C diff: negative GI pathogen panel: pending Stool culture: pending   Assessment & Plan   1. 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 Although baseline thrombocytopenia is likely from portal hypertension with splenomegaly, her platelet count continues to drop - recommend primary service evaluate for other causes.   2. 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 On full liquids.  Stop Zosyn if GI pathogen panel negative.   3. Anemia. Trend Hb which is currently stable at 10.2.  4. Hyperglycemia. Possibly increased from corticosteroids. Per primary service.   5. Leukopenia. WBC has decreased. R/O hematologic process per primary service.    Principal Problem:   Colitis, acute Active Problems:   DEHYDRATION (VOLUME DEPLETION)   THROMBOCYTOPENIA   CROHN'S DISEASE-LARGE INTESTINE   CIRRHOSIS   Fibromyalgia syndrome   Bloody diarrhea   Renal insufficiency, mild   Abdominal pain, acute, left lower quadrant   GI bleed   Acute colitis   Regional enteritis of small intestine with large intestine    LOS: 3 days   Ladene Artist MD 02/26/2014, 12:42 PM

## 2014-02-26 NOTE — Progress Notes (Signed)
PATIENT DETAILS Name: [name] Age: 68 y.o. Sex: female Date of Birth: 06-Jan-1946 Admit Date: 02/23/2014 Admitting Physician Phillips Grout, MD AQT:MAUQ Plotnikov, MD  Subjective: Claims to have essentially unchanged abd pain and bloody diarrhea.  Assessment/Plan: Principal Problem: Acute Distal Ileitis -has hx of Crohn's disease-not on any maintenance medications. Likely Crohn's flare -Seen by GI, started on IV Solumedrol on 5/24,continue with empiric Zosyn, await GI pathogen panel. C Diff PCR negative.  Acute Renal Failure -likely secondary to diarrhea -resolved with IVF -monitor lytes periodically  Liver Cirrhosis -reviewed prior records-Hx of NASH -appears stable at present -hold Lactulose/Aldactone/Demadex for now-as with diarrhea  Pancytopenia -seecondary to above -chronic issue-platelets dropping-monitor    DEHYDRATION (VOLUME DEPLETION) -better-c/w IVF  DM-Uncontrolled -change SSI to moderate, start Lantus 14 units-monitor CBG's  Anxiety/Fibromyalgia -c/w Wellbutrin, Celexa and Gabapentin -start prn Xanax  Obesity -counseled extensively  Disposition: Remain inpatient  DVT Prophylaxis: SCD's  Code Status: Full code   Family Communication None at bedside  Procedures:  None  CONSULTS:  GI   MEDICATIONS: Scheduled Meds: . cholecalciferol  1,000 Units Oral Daily  . citalopram  10 mg Oral Daily  . feeding supplement (RESOURCE BREEZE)  1 Container Oral TID BM  . insulin aspart  0-15 Units Subcutaneous TID WC  . insulin glargine  15 Units Subcutaneous Daily  . methylPREDNISolone (SOLU-MEDROL) injection  40 mg Intravenous Daily  . mupirocin ointment  1 application Nasal BID  . pantoprazole  40 mg Oral Q0600  . piperacillin-tazobactam (ZOSYN)  IV  3.375 g Intravenous 3 times per day   Continuous Infusions:   PRN Meds:.ALPRAZolam, gabapentin, morphine injection, ondansetron (ZOFRAN) IV, ondansetron  Antibiotics: Anti-infectives     Start     Dose/Rate Route Frequency Ordered Stop   02/24/14 0600  piperacillin-tazobactam (ZOSYN) IVPB 3.375 g     3.375 g 12.5 mL/hr over 240 Minutes Intravenous 3 times per day 02/24/14 0237     02/23/14 2315  piperacillin-tazobactam (ZOSYN) IVPB 3.375 g     3.375 g 100 mL/hr over 30 Minutes Intravenous  Once 02/23/14 2304 02/24/14 0055       PHYSICAL EXAM: Vital signs in last 24 hours: Filed Vitals:   02/25/14 0535 02/25/14 1416 02/25/14 2052 02/26/14 0436  BP: 107/67 106/63 133/73 149/81  Pulse: 87  92 82  Temp: 98.6 F (37 C)  98.1 F (36.7 C) 97.7 F (36.5 C)  TempSrc: Oral  Oral Oral  Resp: 16 18 18 18   Height:      Weight:    83.008 kg (183 lb)  SpO2: 95% 92% 94% 93%    Weight change: -0 kg (-0 oz) Filed Weights   02/24/14 0404 02/25/14 0500 02/26/14 0436  Weight: 82.6 kg (182 lb 1.6 oz) 83.008 kg (183 lb) 83.008 kg (183 lb)   Body mass index is 32.43 kg/(m^2).   Gen Exam: Awake and alert with clear speech.   Neck: Supple, No JVD.   Chest: B/L Clear.   CVS: S1 S2 Regular, no murmurs.  Abdomen: soft, BS +, Mildly tender in the mid abdomen, non distended.  Extremities: mild bilateral edema, lower extremities warm to touch. Neurologic: Non Focal.   Skin: No Rash.   Wounds: N/A.   Intake/Output from previous day:  Intake/Output Summary (Last 24 hours) at 02/26/14 1237 Last data filed at 02/25/14 1830  Gross per 24 hour  Intake    666 ml  Output  0 ml  Net    666 ml     LAB RESULTS: CBC  Recent Labs Lab 02/20/14 1701 02/23/14 1334 02/24/14 0430 02/25/14 0740 02/26/14 0504  WBC 4.4 5.0 3.9* 2.2* 1.8*  HGB 12.9 12.7 11.2* 9.9* 10.2*  HCT 38.1 37.2 32.2* 28.6* 30.4*  PLT 48.0* 49* 38* 26* 22*  MCV 92.3 90.1 89.2 90.2 91.8  MCH  --  30.8 31.0 31.2 30.8  MCHC 33.9 34.1 34.8 34.6 33.6  RDW 19.7* 18.8* 18.4* 18.2* 18.2*  LYMPHSABS 1.5  --   --   --   --   MONOABS 0.4  --   --   --   --   EOSABS 0.4  --   --   --   --   BASOSABS 0.0  --    --   --   --     Chemistries   Recent Labs Lab 02/20/14 1701 02/23/14 1334 02/24/14 0430 02/25/14 0740 02/26/14 0504  NA 135 129* 137 137 135*  K 2.8* 3.5* 3.7 3.2* 4.0  CL 106 94* 102 104 104  CO2 23 21 24 26 22   GLUCOSE 81 153* 159* 195* 475*  BUN 10 34* 31* 19 15  CREATININE 0.7 1.36* 1.09 0.99 0.82  CALCIUM 8.8 8.7 8.7 8.3* 8.1*  MG  --   --   --   --  1.6    CBG:  Recent Labs Lab 02/25/14 1659 02/25/14 2047 02/26/14 0744 02/26/14 1021 02/26/14 1209  GLUCAP 383* 337* 435* 335* 261*    GFR Estimated Creatinine Clearance: 68.8 ml/min (by C-G formula based on Cr of 0.82).  Coagulation profile  Recent Labs Lab 02/20/14 1701 02/23/14 1723 02/24/14 0430  INR 1.3* 1.58* 1.47    Cardiac Enzymes No results found for this basename: CK, CKMB, TROPONINI, MYOGLOBIN,  in the last 168 hours  No components found with this basename: POCBNP,  No results found for this basename: DDIMER,  in the last 72 hours No results found for this basename: HGBA1C,  in the last 72 hours No results found for this basename: CHOL, HDL, LDLCALC, TRIG, CHOLHDL, LDLDIRECT,  in the last 72 hours No results found for this basename: TSH, T4TOTAL, FREET3, T3FREE, THYROIDAB,  in the last 72 hours No results found for this basename: VITAMINB12, FOLATE, FERRITIN, TIBC, IRON, RETICCTPCT,  in the last 72 hours  Recent Labs  02/23/14 1723  LIPASE 30    Urine Studies No results found for this basename: UACOL, UAPR, USPG, UPH, UTP, UGL, UKET, UBIL, UHGB, UNIT, UROB, ULEU, UEPI, UWBC, URBC, UBAC, CAST, CRYS, UCOM, BILUA,  in the last 72 hours  MICROBIOLOGY: Recent Results (from the past 240 hour(s))  STOOL CULTURE     Status: None   Collection Time    02/23/14 11:07 PM      Result Value Ref Range Status   Specimen Description STOOL   Final   Special Requests NONE   Final   Culture     Final   Value: Culture reincubated for better growth     Performed at [facility]   Report  Status PENDING   Incomplete  CLOSTRIDIUM DIFFICILE BY PCR     Status: None   Collection Time    02/23/14 11:07 PM      Result Value Ref Range Status   C difficile by pcr NEGATIVE  NEGATIVE Final  MRSA PCR SCREENING     Status: None   Collection Time  02/24/14  2:26 AM      Result Value Ref Range Status   MRSA by PCR NEGATIVE  NEGATIVE Final   Comment:            The GeneXpert MRSA Assay (FDA     approved for NASAL specimens     only), is one component of a     comprehensive MRSA colonization     surveillance program. It is not     intended to diagnose MRSA     infection nor to guide or     monitor treatment for     MRSA infections.    RADIOLOGY STUDIES/RESULTS: Ct Abdomen Pelvis Wo Contrast  02/23/2014   CLINICAL DATA:  Left-sided abdominal pain.  EXAM: CT ABDOMEN AND PELVIS WITHOUT CONTRAST  TECHNIQUE: Multidetector CT imaging of the abdomen and pelvis was performed following the standard protocol without IV contrast.  COMPARISON:  CT of the abdomen and pelvis from 08/27/2010  FINDINGS: A few calcified granulomata are seen at the lung bases. Dense calcification is noted at the mitral valve.  Small volume ascites is noted about the liver and spleen. Mild mesenteric edema is noted.  Diffuse cirrhotic change is noted, with extensive nodularity of the liver. The spleen is enlarged, measuring 17.5 cm in length. The patient is status post cholecystectomy, with clips noted at the gallbladder fossa. The pancreas and adrenal glands are unremarkable in appearance.  Prominent nonobstructing bilateral renal stones are seen, measuring up to 6 mm in size. There is no evidence of hydronephrosis. A 2.5 cm cyst is noted at the upper pole of the left kidney. Bilateral renal pelvicaliectasis remains within normal limits. No obstructing ureteral stones are seen. Mild nonspecific perinephric stranding is noted bilaterally.  Trace free fluid is noted tracking about small bowel loops. There is segmental wall  thickening of a short segment of distal ileum at the lower abdomen, measuring up to 7 cm in length. This could reflect an infectious process, though malignancy cannot be entirely excluded. Would correlate with the patient's symptoms and consider capsule endoscopy for further evaluation.  Trace postoperative fluid is noted along the midline abdomen.  The stomach is within normal limits. No acute vascular abnormalities are seen. Minimal calcification is seen along the abdominal aorta. Mild calcification is also seen along the portal venous system.  The appendix is not definitely seen. The terminal ileum appears mildly thickened, though this may be reactive in nature due to mild omental inflammation. The colon is grossly unremarkable in appearance.  The bladder is moderately distended and grossly unremarkable. The patient is status post hysterectomy. No suspicious adnexal masses are seen. No inguinal lymphadenopathy is seen.  No acute osseous abnormalities are identified.  IMPRESSION: 1. Segmental wall thickening of a short segment of distal ileum at the lower abdomen, measuring up to 7 cm in length. This could reflect an infectious process, though malignancy cannot be entirely excluded. Would correlate with the patient's symptoms and consider capsule endoscopy for further evaluation, when and as deemed clinically appropriate. 2. Terminal ileum appears mildly thickened, though this may be reactive in nature due to mild underlying omental inflammation. 3. Small volume ascites about the liver and spleen, with mild mesenteric edema. 4. Diffuse cirrhotic change noted; splenomegaly seen. 5. Prominent nonobstructing bilateral renal stones again noted, measuring up to 6 mm in size. Left renal cyst seen. 6. Trace focal postoperative fluid noted along the midline abdomen. 7. Dense calcification noted at the mitral valve.   Electronically Signed  By: Garald Balding M.D.   On: 02/23/2014 22:14   Dg Chest 2 View  02/23/2014    CLINICAL DATA:  Cough.  EXAM: CHEST  2 VIEW  COMPARISON:  10/29/2013 and 06/24/2014 as well as chest CT 10/29/2013  FINDINGS: The lungs are adequately inflated without focal consolidation or effusion. There are a few small nodules over the right mid to upper lung compatible calcified granulomas on as seen on chest CT. There is minimal prominence of the perihilar markings suggesting mild vascular congestion. No effusion. There is mild stable cardiomegaly. There are degenerative changes of the spine with a stable mild compression fracture at the thoracolumbar junction.  IMPRESSION: Suggestion of mild vascular congestion with mild stable cardiomegaly.  Stable compression fracture over the thoracolumbar junction.   Electronically Signed   By: Marin Olp M.D.   On: 02/23/2014 16:06    Jolynne Spurgin Kristeen Mans, MD  [facility] Pager:[phone]  If 7PM-7AM, please contact night-coverage [email or web address] Password [email or web address] 02/26/2014, 12:37 PM   LOS: 3 days   **Disclaimer: This note may have been dictated with voice recognition software. Similar sounding words can inadvertently be transcribed and this note may contain transcription errors which may not have been corrected upon publication of note.**

## 2014-02-26 NOTE — Progress Notes (Signed)
Ms. Gowen refuses to take anything for abdominal pain but Gabapentin.  Refused oxycodone and morphine.

## 2014-02-26 NOTE — Progress Notes (Signed)
CRITICAL VALUE ALERT  Critical value received:CBG  435  Date of notification:  02/26/2014  Time of notification:  0744  Critical value read back:yes  Nurse who received alert:  Rexford Maus  MD notified (1st page):  Dr. Sloan Leiter  Time of first page:  [phone]  MD notified (2nd page):  Time of second page:  Responding MD:  Dr. Sloan Leiter  Time MD responded:  [phone]

## 2014-02-27 DIAGNOSIS — K5090 Crohn's disease, unspecified, without complications: Secondary | ICD-10-CM

## 2014-02-27 DIAGNOSIS — D696 Thrombocytopenia, unspecified: Secondary | ICD-10-CM

## 2014-02-27 LAB — CBC WITH DIFFERENTIAL/PLATELET
BASOS ABS: 0 10*3/uL (ref 0.0–0.1)
Basophils Relative: 0 % (ref 0–1)
EOS ABS: 0.2 10*3/uL (ref 0.0–0.7)
EOS PCT: 4 % (ref 0–5)
HCT: 28.9 % — ABNORMAL LOW (ref 36.0–46.0)
Hemoglobin: 9.9 g/dL — ABNORMAL LOW (ref 12.0–15.0)
LYMPHS PCT: 14 % (ref 12–46)
Lymphs Abs: 0.6 10*3/uL — ABNORMAL LOW (ref 0.7–4.0)
MCH: 31.1 pg (ref 26.0–34.0)
MCHC: 34.3 g/dL (ref 30.0–36.0)
MCV: 90.9 fL (ref 78.0–100.0)
Monocytes Absolute: 0.3 10*3/uL (ref 0.1–1.0)
Monocytes Relative: 7 % (ref 3–12)
Neutro Abs: 3.5 10*3/uL (ref 1.7–7.7)
Neutrophils Relative %: 76 % (ref 43–77)
PLATELETS: 27 10*3/uL — AB (ref 150–400)
RBC: 3.18 MIL/uL — ABNORMAL LOW (ref 3.87–5.11)
RDW: 18.5 % — AB (ref 11.5–15.5)
WBC: 4.6 10*3/uL (ref 4.0–10.5)

## 2014-02-27 LAB — HEMOGLOBIN A1C
HEMOGLOBIN A1C: 10.3 % — AB (ref <5.7)
MEAN PLASMA GLUCOSE: 249 mg/dL — AB (ref <117)

## 2014-02-27 LAB — BASIC METABOLIC PANEL
BUN: 14 mg/dL (ref 6–23)
BUN: 15 mg/dL (ref 6–23)
CALCIUM: 8.5 mg/dL (ref 8.4–10.5)
CO2: 22 mEq/L (ref 19–32)
CO2: 24 mEq/L (ref 19–32)
Calcium: 8.3 mg/dL — ABNORMAL LOW (ref 8.4–10.5)
Chloride: 101 mEq/L (ref 96–112)
Chloride: 98 mEq/L (ref 96–112)
Creatinine, Ser: 0.88 mg/dL (ref 0.50–1.10)
Creatinine, Ser: 0.91 mg/dL (ref 0.50–1.10)
GFR calc Af Amer: 75 mL/min — ABNORMAL LOW (ref >90)
GFR calc Af Amer: 78 mL/min — ABNORMAL LOW (ref >90)
GFR calc non Af Amer: 64 mL/min — ABNORMAL LOW (ref >90)
GFR calc non Af Amer: 67 mL/min — ABNORMAL LOW (ref >90)
Glucose, Bld: 283 mg/dL — ABNORMAL HIGH (ref 70–99)
Glucose, Bld: 574 mg/dL (ref 70–99)
Potassium: 3.7 mEq/L (ref 3.7–5.3)
Potassium: 4.2 mEq/L (ref 3.7–5.3)
Sodium: 129 mEq/L — ABNORMAL LOW (ref 137–147)
Sodium: 134 mEq/L — ABNORMAL LOW (ref 137–147)

## 2014-02-27 LAB — GI PATHOGEN PANEL BY PCR, STOOL
C DIFFICILE TOXIN A/B: NEGATIVE
Campylobacter by PCR: NEGATIVE
Cryptosporidium by PCR: NEGATIVE
E COLI (STEC): NEGATIVE
E coli (ETEC) LT/ST: NEGATIVE
E coli 0157 by PCR: NEGATIVE
G lamblia by PCR: NEGATIVE
NOROVIRUS G1/G2: NEGATIVE
ROTAVIRUS A BY PCR: NEGATIVE
Salmonella by PCR: NEGATIVE
Shigella by PCR: NEGATIVE

## 2014-02-27 LAB — MAGNESIUM: Magnesium: 1.8 mg/dL (ref 1.5–2.5)

## 2014-02-27 LAB — GLUCOSE, CAPILLARY
GLUCOSE-CAPILLARY: 332 mg/dL — AB (ref 70–99)
GLUCOSE-CAPILLARY: 364 mg/dL — AB (ref 70–99)
Glucose-Capillary: 237 mg/dL — ABNORMAL HIGH (ref 70–99)
Glucose-Capillary: 261 mg/dL — ABNORMAL HIGH (ref 70–99)

## 2014-02-27 LAB — STOOL CULTURE

## 2014-02-27 MED ORDER — INSULIN ASPART 100 UNIT/ML ~~LOC~~ SOLN
0.0000 [IU] | Freq: Three times a day (TID) | SUBCUTANEOUS | Status: DC
  Administered 2014-02-27: 7 [IU] via SUBCUTANEOUS
  Administered 2014-02-27: 11 [IU] via SUBCUTANEOUS
  Administered 2014-02-27: 15 [IU] via SUBCUTANEOUS
  Administered 2014-02-28: 20 [IU] via SUBCUTANEOUS
  Administered 2014-02-28: 7 [IU] via SUBCUTANEOUS
  Administered 2014-02-28: 4 [IU] via SUBCUTANEOUS
  Administered 2014-03-01: 7 [IU] via SUBCUTANEOUS
  Administered 2014-03-01: 11 [IU] via SUBCUTANEOUS

## 2014-02-27 MED ORDER — DEXTROSE 5 % IV SOLN
1.0000 g | INTRAVENOUS | Status: DC
  Administered 2014-02-27 – 2014-03-01 (×3): 1 g via INTRAVENOUS
  Filled 2014-02-27 (×3): qty 10

## 2014-02-27 MED ORDER — DIPHENHYDRAMINE HCL 25 MG PO CAPS
25.0000 mg | ORAL_CAPSULE | Freq: Four times a day (QID) | ORAL | Status: DC | PRN
  Administered 2014-02-28: 25 mg via ORAL
  Filled 2014-02-27: qty 1

## 2014-02-27 MED ORDER — METRONIDAZOLE 500 MG PO TABS
500.0000 mg | ORAL_TABLET | Freq: Three times a day (TID) | ORAL | Status: DC
  Administered 2014-02-27 – 2014-03-01 (×6): 500 mg via ORAL
  Filled 2014-02-27 (×9): qty 1

## 2014-02-27 MED ORDER — INSULIN GLARGINE 100 UNIT/ML ~~LOC~~ SOLN
25.0000 [IU] | Freq: Two times a day (BID) | SUBCUTANEOUS | Status: DC
  Administered 2014-02-27 – 2014-02-28 (×3): 25 [IU] via SUBCUTANEOUS
  Filled 2014-02-27 (×7): qty 0.25

## 2014-02-27 MED ORDER — INSULIN ASPART 100 UNIT/ML ~~LOC~~ SOLN
15.0000 [IU] | Freq: Once | SUBCUTANEOUS | Status: AC
  Administered 2014-02-27: 15 [IU] via SUBCUTANEOUS

## 2014-02-27 NOTE — Progress Notes (Signed)
Inpatient Diabetes Program Recommendations  AACE/ADA: New Consensus Statement on Inpatient Glycemic Control (2013)  Target Ranges:  Prepandial:   less than 140 mg/dL      Peak postprandial:   less than 180 mg/dL (1-2 hours)      Critically ill patients:  140 - 180 mg/dL   Results for Grindle, Rudie (MRN [id number]) as of 02/27/2014 10:40  Ref. Range 02/26/2014 07:44 02/26/2014 10:21 02/26/2014 12:09 02/26/2014 17:05 02/26/2014 22:06 02/27/2014 08:01  Glucose-Capillary Latest Range: 70-99 mg/dL 435 (H) 335 (H) 261 (H) 323 (H) 529 (H) 237 (H)   Diabetes history: DM2 Outpatient Diabetes medications: Humalog 20 units QID (with meals and at bedtime) Current orders for Inpatient glycemic control: Lantus 25 units BID, Novolog 0-20 units AC  Inpatient Diabetes Program Recommendations Correction (SSI): Please consider ordering Novolog bedtime correction scale. Insulin - Meal Coverage: Please consider ordering Novolog 6 units TID with meals for meal coverage. Basal: Noted Lantus was increased from 15 units BID to 25 units BID today.  Thanks, Barnie Alderman, RN, MSN, CCRN Diabetes Coordinator Inpatient Diabetes Program [phone] (Team Pager) [phone] (AP office) [phone] [facility] office)

## 2014-02-27 NOTE — Progress Notes (Signed)
PATIENT DETAILS Name: [name] Age: 68 y.o. Sex: female Date of Birth: 1946/05/09 Admit Date: 02/23/2014 Admitting Physician Phillips Grout, MD [id number] Plotnikov, MD  Subjective: Diarrhea somewhat better, contines to have abdoo  Assessment/Plan: Principal Problem: Acute Distal Ileitis -has hx of Crohn's disease-not on any maintenance medications. Likely Crohn's flare -Seen by GI, started on IV Solumedrol on 5/24,was on empiric Zosyn-since 5/22-however given worsening thrombocytopenia-will change to Rocephin/Flagyl on 5/26, await GI pathogen panel. C Diff PCR negative.  Acute Renal Failure -likely secondary to diarrhea -resolved with IVF -monitor lytes periodically  Liver Cirrhosis -reviewed prior records-Hx of NASH -appears stable at present -hold Lactulose/Aldactone/Demadex for now-as with diarrhea  Pancytopenia -seecondary to above -chronic issue-platelets dropping-monitor-have changed Zosyn to Rocephin and Flagyl, will stop Protonix    DEHYDRATION (VOLUME DEPLETION) -better-with IVF  DM-Uncontrolled -uncontrolled likely secondary to steroids -change SSI to resistant, change Lantus to 25 units BID-monitor CBG's and adjust accordingly -check A1C  Anxiety/Fibromyalgia -c/w Wellbutrin, Celexa and Gabapentin -start prn Xanax  Obesity -counseled extensively  Disposition: Remain inpatient  DVT Prophylaxis: SCD's  Code Status: Full code   Family Communication None at bedside-called spouse-[name] [phone] to leave voicemail.  Procedures:  None  CONSULTS:  GI   MEDICATIONS: Scheduled Meds: . cefTRIAXone (ROCEPHIN)  IV  1 g Intravenous Q24H  . cholecalciferol  1,000 Units Oral Daily  . citalopram  10 mg Oral Daily  . feeding supplement (RESOURCE BREEZE)  1 Container Oral TID BM  . insulin aspart  0-20 Units Subcutaneous TID WC  . insulin glargine  25 Units Subcutaneous BID  . methylPREDNISolone (SOLU-MEDROL) injection  40 mg  Intravenous Daily  . metroNIDAZOLE  500 mg Oral 3 times per day  . mupirocin ointment  1 application Nasal BID   Continuous Infusions:   PRN Meds:.ALPRAZolam, diphenhydrAMINE, gabapentin, morphine injection, ondansetron (ZOFRAN) IV, ondansetron, oxyCODONE  Antibiotics: Anti-infectives   Start     Dose/Rate Route Frequency Ordered Stop   02/27/14 1400  metroNIDAZOLE (FLAGYL) tablet 500 mg     500 mg Oral 3 times per day 02/27/14 1006     02/27/14 1200  cefTRIAXone (ROCEPHIN) 1 g in dextrose 5 % 50 mL IVPB     1 g 100 mL/hr over 30 Minutes Intravenous Every 24 hours 02/27/14 1006     02/24/14 0600  piperacillin-tazobactam (ZOSYN) IVPB 3.375 g  Status:  Discontinued     3.375 g 12.5 mL/hr over 240 Minutes Intravenous 3 times per day 02/24/14 0237 02/27/14 0941   02/23/14 2315  piperacillin-tazobactam (ZOSYN) IVPB 3.375 g     3.375 g 100 mL/hr over 30 Minutes Intravenous  Once 02/23/14 2304 02/24/14 0055       PHYSICAL EXAM: Vital signs in last 24 hours: Filed Vitals:   02/26/14 0436 02/26/14 1443 02/26/14 2004 02/27/14 0506  BP: 149/81 112/70 102/64 100/58  Pulse: 82 87 84 82  Temp: 97.7 F (36.5 C) 98.3 F (36.8 C) 98 F (36.7 C) 98.1 F (36.7 C)  TempSrc: Oral Oral Oral Oral  Resp: 18 18 12 12   Height:      Weight: 83.008 kg (183 lb)   82.101 kg (181 lb)  SpO2: 93% 95% 95% 91%    Weight change: -0.907 kg (-2 lb) Filed Weights   02/25/14 0500 02/26/14 0436 02/27/14 0506  Weight: 83.008 kg (183 lb) 83.008 kg (183 lb) 82.101 kg (181 lb)   Body mass index is 32.07 kg/(m^2).   Gen  Exam: Awake and alert with clear speech.   Neck: Supple, No JVD.   Chest: B/L Clear.   CVS: S1 S2 Regular, no murmurs.  Abdomen: soft, BS +, Mildly tender in the mid abdomen, non distended.  Extremities: mild bilateral edema, lower extremities warm to touch. Neurologic: Non Focal.   Skin: No Rash.   Wounds: N/A.   Intake/Output from previous day:  Intake/Output Summary (Last 24  hours) at 02/27/14 1114 Last data filed at 02/26/14 2222  Gross per 24 hour  Intake    100 ml  Output      3 ml  Net     97 ml     LAB RESULTS: CBC  Recent Labs Lab 02/20/14 1701 02/23/14 1334 02/24/14 0430 02/25/14 0740 02/26/14 0504 02/27/14 0622  WBC 4.4 5.0 3.9* 2.2* 1.8* 4.6  HGB 12.9 12.7 11.2* 9.9* 10.2* 9.9*  HCT 38.1 37.2 32.2* 28.6* 30.4* 28.9*  PLT 48.0* 49* 38* 26* 22* 27*  MCV 92.3 90.1 89.2 90.2 91.8 90.9  MCH  --  30.8 31.0 31.2 30.8 31.1  MCHC 33.9 34.1 34.8 34.6 33.6 34.3  RDW 19.7* 18.8* 18.4* 18.2* 18.2* 18.5*  LYMPHSABS 1.5  --   --   --   --  0.6*  MONOABS 0.4  --   --   --   --  0.3  EOSABS 0.4  --   --   --   --  0.2  BASOSABS 0.0  --   --   --   --  0.0    Chemistries   Recent Labs Lab 02/24/14 0430 02/25/14 0740 02/26/14 0504 02/26/14 2359 02/27/14 0622  NA 137 137 135* 129* 134*  K 3.7 3.2* 4.0 4.2 3.7  CL 102 104 104 98 101  CO2 24 26 22 22 24   GLUCOSE 159* 195* 475* 574* 283*  BUN 31* 19 15 15 14   CREATININE 1.09 0.99 0.82 0.88 0.91  CALCIUM 8.7 8.3* 8.1* 8.3* 8.5  MG  --   --  1.6  --  1.8    CBG:  Recent Labs Lab 02/26/14 1021 02/26/14 1209 02/26/14 1705 02/26/14 2206 02/27/14 0801  GLUCAP 335* 261* 323* 529* 237*    GFR Estimated Creatinine Clearance: 61.7 ml/min (by C-G formula based on Cr of 0.91).  Coagulation profile  Recent Labs Lab 02/20/14 1701 02/23/14 1723 02/24/14 0430  INR 1.3* 1.58* 1.47    Cardiac Enzymes No results found for this basename: CK, CKMB, TROPONINI, MYOGLOBIN,  in the last 168 hours  No components found with this basename: POCBNP,  No results found for this basename: DDIMER,  in the last 72 hours No results found for this basename: HGBA1C,  in the last 72 hours No results found for this basename: CHOL, HDL, LDLCALC, TRIG, CHOLHDL, LDLDIRECT,  in the last 72 hours No results found for this basename: TSH, T4TOTAL, FREET3, T3FREE, THYROIDAB,  in the last 72 hours No results  found for this basename: VITAMINB12, FOLATE, FERRITIN, TIBC, IRON, RETICCTPCT,  in the last 72 hours No results found for this basename: LIPASE, AMYLASE,  in the last 72 hours  Urine Studies No results found for this basename: UACOL, UAPR, USPG, UPH, UTP, UGL, UKET, UBIL, UHGB, UNIT, UROB, ULEU, UEPI, UWBC, URBC, UBAC, CAST, CRYS, UCOM, BILUA,  in the last 72 hours  MICROBIOLOGY: Recent Results (from the past 240 hour(s))  STOOL CULTURE     Status: None   Collection Time    02/23/14 11:07 PM  Result Value Ref Range Status   Specimen Description STOOL   Final   Special Requests NONE   Final   Culture     Final   Value: NO SALMONELLA, SHIGELLA, CAMPYLOBACTER, YERSINIA, OR E.COLI 0157:H7 ISOLATED     Performed at [facility]   Report Status 02/27/2014 FINAL   Final  CLOSTRIDIUM DIFFICILE BY PCR     Status: None   Collection Time    02/23/14 11:07 PM      Result Value Ref Range Status   C difficile by pcr NEGATIVE  NEGATIVE Final  MRSA PCR SCREENING     Status: None   Collection Time    02/24/14  2:26 AM      Result Value Ref Range Status   MRSA by PCR NEGATIVE  NEGATIVE Final   Comment:            The GeneXpert MRSA Assay (FDA     approved for NASAL specimens     only), is one component of a     comprehensive MRSA colonization     surveillance program. It is not     intended to diagnose MRSA     infection nor to guide or     monitor treatment for     MRSA infections.    RADIOLOGY STUDIES/RESULTS: Ct Abdomen Pelvis Wo Contrast  02/23/2014   CLINICAL DATA:  Left-sided abdominal pain.  EXAM: CT ABDOMEN AND PELVIS WITHOUT CONTRAST  TECHNIQUE: Multidetector CT imaging of the abdomen and pelvis was performed following the standard protocol without IV contrast.  COMPARISON:  CT of the abdomen and pelvis from 08/27/2010  FINDINGS: A few calcified granulomata are seen at the lung bases. Dense calcification is noted at the mitral valve.  Small volume ascites is noted about  the liver and spleen. Mild mesenteric edema is noted.  Diffuse cirrhotic change is noted, with extensive nodularity of the liver. The spleen is enlarged, measuring 17.5 cm in length. The patient is status post cholecystectomy, with clips noted at the gallbladder fossa. The pancreas and adrenal glands are unremarkable in appearance.  Prominent nonobstructing bilateral renal stones are seen, measuring up to 6 mm in size. There is no evidence of hydronephrosis. A 2.5 cm cyst is noted at the upper pole of the left kidney. Bilateral renal pelvicaliectasis remains within normal limits. No obstructing ureteral stones are seen. Mild nonspecific perinephric stranding is noted bilaterally.  Trace free fluid is noted tracking about small bowel loops. There is segmental wall thickening of a short segment of distal ileum at the lower abdomen, measuring up to 7 cm in length. This could reflect an infectious process, though malignancy cannot be entirely excluded. Would correlate with the patient's symptoms and consider capsule endoscopy for further evaluation.  Trace postoperative fluid is noted along the midline abdomen.  The stomach is within normal limits. No acute vascular abnormalities are seen. Minimal calcification is seen along the abdominal aorta. Mild calcification is also seen along the portal venous system.  The appendix is not definitely seen. The terminal ileum appears mildly thickened, though this may be reactive in nature due to mild omental inflammation. The colon is grossly unremarkable in appearance.  The bladder is moderately distended and grossly unremarkable. The patient is status post hysterectomy. No suspicious adnexal masses are seen. No inguinal lymphadenopathy is seen.  No acute osseous abnormalities are identified.  IMPRESSION: 1. Segmental wall thickening of a short segment of distal ileum at the lower abdomen, measuring up  to 7 cm in length. This could reflect an infectious process, though malignancy  cannot be entirely excluded. Would correlate with the patient's symptoms and consider capsule endoscopy for further evaluation, when and as deemed clinically appropriate. 2. Terminal ileum appears mildly thickened, though this may be reactive in nature due to mild underlying omental inflammation. 3. Small volume ascites about the liver and spleen, with mild mesenteric edema. 4. Diffuse cirrhotic change noted; splenomegaly seen. 5. Prominent nonobstructing bilateral renal stones again noted, measuring up to 6 mm in size. Left renal cyst seen. 6. Trace focal postoperative fluid noted along the midline abdomen. 7. Dense calcification noted at the mitral valve.   Electronically Signed   By: Garald Balding M.D.   On: 02/23/2014 22:14   Dg Chest 2 View  02/23/2014   CLINICAL DATA:  Cough.  EXAM: CHEST  2 VIEW  COMPARISON:  10/29/2013 and 06/24/2014 as well as chest CT 10/29/2013  FINDINGS: The lungs are adequately inflated without focal consolidation or effusion. There are a few small nodules over the right mid to upper lung compatible calcified granulomas on as seen on chest CT. There is minimal prominence of the perihilar markings suggesting mild vascular congestion. No effusion. There is mild stable cardiomegaly. There are degenerative changes of the spine with a stable mild compression fracture at the thoracolumbar junction.  IMPRESSION: Suggestion of mild vascular congestion with mild stable cardiomegaly.  Stable compression fracture over the thoracolumbar junction.   Electronically Signed   By: Marin Olp M.D.   On: 02/23/2014 16:06    Shanker Kristeen Mans, MD  [facility] Pager:[phone]  If 7PM-7AM, please contact night-coverage [email or web address] Password [email or web address] 02/27/2014, 11:14 AM   LOS: 4 days   **Disclaimer: This note may have been dictated with voice recognition software. Similar sounding words can inadvertently be transcribed and this note may contain transcription errors which may not  have been corrected upon publication of note.**

## 2014-02-27 NOTE — Progress Notes (Signed)
Panic BMP glucose reported to K. Shorre NP on call via [employer].  Awaiting response.

## 2014-02-27 NOTE — Evaluation (Addendum)
Physical Therapy Evaluation Patient Details Name: [name] MRN: [id number] DOB: 1946/04/10 Today's Date: 02/27/2014   History of Present Illness  68 yo female h/o nonalcoholic cirrhosis, R ankle fx, fibromyalgia comes in with 2 days of diarrhea was initially bloody red, but is now more melanotic with associated llq abd pain. Dx of colitis, crohn's exacerbation.  Clinical Impression  **Pt admitted with colitis, Crohn's exacerbation**. Pt currently with functional limitations due to the deficits listed below (see PT Problem List).  Pt will benefit from skilled PT to increase their independence and safety with mobility to allow discharge to the venue listed below.   *    Follow Up Recommendations No PT follow up    Equipment Recommendations  None recommended by PT    Recommendations for Other Services       Precautions / Restrictions Precautions Precautions: None Restrictions Weight Bearing Restrictions: No      Mobility  Bed Mobility               General bed mobility comments: NT pt up OOB at start of Eval  Transfers Overall transfer level: Modified independent Equipment used: None             General transfer comment: used armrests for sit to stand from [facility]  Ambulation/Gait Ambulation/Gait assistance: Supervision Ambulation Distance (Feet): 150 Feet Assistive device: None Gait Pattern/deviations: Decreased step length - right;Decreased step length - left   Gait velocity interpretation: Below normal speed for age/gender General Gait Details: no LOB, pt reports chronic R ankle pain with walking, 10/10 abdominal pain at rest and with walking  Stairs            Wheelchair Mobility    Modified Rankin (Stroke Patients Only)       Balance Overall balance assessment:  (Pt denies falls at home. But uses walls for support with walking) Sitting-balance support: No upper extremity supported;Feet supported Sitting balance-Leahy Scale: Good        Standing balance-Leahy Scale: Good                               Pertinent Vitals/Pain *10/10 abdominal pain at rest and with walking RN aware**    Home Living Family/patient expects to be discharged to:: Private residence Living Arrangements: Spouse/significant other Available Help at Discharge: Family;Available PRN/intermittently (husband works 1/2 time) Type of Home: House Home Access: Stairs to enter Entrance Stairs-Rails: Right Entrance Stairs-Number of Steps: 5 Home Layout: One level Home Equipment: Cane - single point;Walker - 2 wheels      Prior Function Level of Independence: Independent with assistive device(s)         Comments: used walker just for in/out of bed, walks without AD, states she holds unto walls at home when walking, independent with ADLs, doesn't drive     Hand Dominance        Extremity/Trunk Assessment   Upper Extremity Assessment: Overall WFL for tasks assessed           Lower Extremity Assessment: Overall WFL for tasks assessed (h/o R ankle fx 9 yrs ago, pain with resisted ankle DF)      Cervical / Trunk Assessment: Normal  Communication   Communication: No difficulties  Cognition Arousal/Alertness: Awake/alert Behavior During Therapy: WFL for tasks assessed/performed Overall Cognitive Status: Within Functional Limits for tasks assessed  General Comments      Exercises  Demonstrated LAQs, seated marching and ankle pumps for independent exercise. Encouraged frequent ambulation with supervision.      Assessment/Plan    PT Assessment Patient needs continued PT services  PT Diagnosis Generalized weakness   PT Problem List Decreased activity tolerance;Pain  PT Treatment Interventions Gait training;Stair training;Patient/family education   PT Goals (Current goals can be found in the Care Plan section) Acute Rehab PT Goals Patient Stated Goal: to go home PT Goal Formulation: With  patient Time For Goal Achievement: 03/13/14 Potential to Achieve Goals: Good    Frequency Min 3X/week   Barriers to discharge        Co-evaluation               End of Session Equipment Utilized During Treatment: Gait belt Activity Tolerance: Patient tolerated treatment well Patient left: in chair;with call bell/phone within reach;with chair alarm set Nurse Communication: Mobility status         Time: 1517-6160 PT Time Calculation (min): 26 min   Charges:   PT Evaluation $Initial PT Evaluation Tier I: 1 Procedure PT Treatments $Gait Training: 8-22 mins   PT G CodesLucile Crater 02/27/2014, 11:12 AM [phone]

## 2014-02-27 NOTE — Care Management Note (Addendum)
    Page 1 of 1   03/01/2014     2:10:33 PM CARE MANAGEMENT NOTE 03/01/2014  Patient:  [name],[name]   Account Number:  [id number]  Date Initiated:  02/27/2014  Documentation initiated by:  Tomi Bamberger  Subjective/Objective Assessment:   dx crohns flare  admit- lives with spouse.     Action/Plan:   pt eval- no pt f/u   Anticipated DC Date:  03/01/2014   Anticipated DC Plan:  [place]  CM consult      [facility] Choice  HOME HEALTH   Choice offered to / List presented to:  C-1 Patient        [place] arranged  [place] - 11 Patient Refused      Status of service:  Completed, signed off Medicare Important Message given?  YES (If response is "NO", the following Medicare IM given date fields will be blank) Date Medicare IM given:  02/23/2014 Date Additional Medicare IM given:  02/27/2014  Discharge Disposition:  HOME/SELF CARE  Per UR Regulation:  Reviewed for med. necessity/level of care/duration of stay  If discussed at Chance of Stay Meetings, dates discussed:    Comments:  03/01/14 [place], BSN [phone] patient lives with husband, she states he is with her 24 hrs/day, NCM offered choice to patient for [facility], she states she does not need a HHRN, because her spouse helps her at home.  NCM informed patient that her co pay will be $40 for lantus pens and she stated she could afford this.  NCM informed RN.

## 2014-02-27 NOTE — Progress Notes (Signed)
[facility] Progress Note  Subjective:  Feels a little better than she did on admission but not much.  Had two episodes of diarrhea this AM; says that stools are dark.  Says that her entire abdomen hurts.  Objective:  Vital signs in last 24 hours: Temp:  [98 F (36.7 C)-98.3 F (36.8 C)] 98.1 F (36.7 C) (05/26 0506) Pulse Rate:  [82-87] 82 (05/26 0506) Resp:  [12-18] 12 (05/26 0506) BP: (100-112)/(58-70) 100/58 mmHg (05/26 0506) SpO2:  [91 %-95 %] 91 % (05/26 0506) Weight:  [181 lb (82.101 kg)] 181 lb (82.101 kg) (05/26 0506) Last BM Date: 02/26/14 General:  Alert, Well-developed, in NAD Heart:  Regular rate and rhythm; no murmurs Pulm:  CTAB.  No W/R/R. Abdomen:  Soft, non-distended. Normal bowel sounds.  Diffuse TTP without R/R/G.  Multiple scars noted on abdomen.  Extremities:  Without edema. Neurologic:  Alert and  oriented x4;  grossly normal neurologically. Psych:  Alert and cooperative. Normal mood and affect.  Intake/Output from previous day: 05/25 0701 - 05/26 0700 In: 100 [IV Piggyback:100] Out: 3 [Urine:2; Stool:1]  Lab Results:  Recent Labs  02/25/14 0740 02/26/14 0504 02/27/14 0622  WBC 2.2* 1.8* 4.6  HGB 9.9* 10.2* 9.9*  HCT 28.6* 30.4* 28.9*  PLT 26* 22* 27*   BMET  Recent Labs  02/26/14 0504 02/26/14 2359 02/27/14 0622  NA 135* 129* 134*  K 4.0 4.2 3.7  CL 104 98 101  CO2 22 22 24   GLUCOSE 475* 574* 283*  BUN 15 15 14   CREATININE 0.82 0.88 0.91  CALCIUM 8.1* 8.3* 8.5   LFT  Recent Labs  02/25/14 0740  PROT 5.9*  ALBUMIN 1.8*  AST 34  ALT 22  ALKPHOS 71  BILITOT 1.6*   Assessment / Plan: 1. 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 Although baseline thrombocytopenia is likely from portal hypertension with splenomegaly, her platelet count dropped precipitously - recommend primary service evaluate for other causes.  2. 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 On full liquids.  GI pathogen panel is pending (Cdiff PCR was negative).  Zosyn was discontinued and she was switched to Flagyl and Rocephin.  3. Anemia. Trend Hgb which is currently stable at 9.9 grams  4. Hyperglycemia. Possibly increased from corticosteroids. Per primary service.  5. Leukopenia. WBC count improved today.    LOS: 4 days   Laban Emperor. Zehr  02/27/2014, 12:11 PM  Pager number [phone]  GI ATTENDING  Interval history and data reviewed. Patient seen and examined. Agree with H&P as above. Case reviewed at morning report with GI colleagues. Appears to have Crohn's flare. Now on steroids. Cytopenia noted. Continue current treatment plan. We'll follow  Docia Chuck. Geri Seminole., M.D. [facility] Division of Gastroenterology

## 2014-02-28 ENCOUNTER — Encounter: Payer: Self-pay | Admitting: *Deleted

## 2014-02-28 LAB — CBC
HEMATOCRIT: 31.8 % — AB (ref 36.0–46.0)
Hemoglobin: 10.7 g/dL — ABNORMAL LOW (ref 12.0–15.0)
MCH: 31 pg (ref 26.0–34.0)
MCHC: 33.6 g/dL (ref 30.0–36.0)
MCV: 92.2 fL (ref 78.0–100.0)
PLATELETS: 23 10*3/uL — AB (ref 150–400)
RBC: 3.45 MIL/uL — ABNORMAL LOW (ref 3.87–5.11)
RDW: 19 % — AB (ref 11.5–15.5)
WBC: 4 10*3/uL (ref 4.0–10.5)

## 2014-02-28 LAB — BASIC METABOLIC PANEL
BUN: 11 mg/dL (ref 6–23)
CO2: 26 mEq/L (ref 19–32)
Calcium: 8.8 mg/dL (ref 8.4–10.5)
Chloride: 105 mEq/L (ref 96–112)
Creatinine, Ser: 0.74 mg/dL (ref 0.50–1.10)
GFR, EST NON AFRICAN AMERICAN: 87 mL/min — AB (ref >90)
Glucose, Bld: 250 mg/dL — ABNORMAL HIGH (ref 70–99)
Potassium: 4.2 mEq/L (ref 3.7–5.3)
Sodium: 138 mEq/L (ref 137–147)

## 2014-02-28 LAB — GLUCOSE, CAPILLARY
Glucose-Capillary: 191 mg/dL — ABNORMAL HIGH (ref 70–99)
Glucose-Capillary: 235 mg/dL — ABNORMAL HIGH (ref 70–99)
Glucose-Capillary: 387 mg/dL — ABNORMAL HIGH (ref 70–99)
Glucose-Capillary: 397 mg/dL — ABNORMAL HIGH (ref 70–99)

## 2014-02-28 MED ORDER — LOPERAMIDE HCL 2 MG PO CAPS: 2.0000 mg | ORAL_CAPSULE | ORAL | Status: DC | PRN

## 2014-02-28 MED ORDER — PREDNISONE 20 MG PO TABS
40.0000 mg | ORAL_TABLET | Freq: Every day | ORAL | Status: DC
  Administered 2014-03-01: 40 mg via ORAL
  Filled 2014-02-28 (×2): qty 2

## 2014-02-28 MED ORDER — INSULIN GLARGINE 100 UNIT/ML ~~LOC~~ SOLN
27.0000 [IU] | Freq: Two times a day (BID) | SUBCUTANEOUS | Status: DC
  Administered 2014-02-28 – 2014-03-01 (×2): 27 [IU] via SUBCUTANEOUS
  Filled 2014-02-28 (×3): qty 0.27

## 2014-02-28 NOTE — Progress Notes (Signed)
     [facility] Progress Note  Subjective:  Tells me that she still has abdominal pain.  Says that she had 3 or 4 stools this AM but says that some are just very small amounts like quarter size.    Objective:  Vital signs in last 24 hours: Temp:  [97.6 F (36.4 C)-98.5 F (36.9 C)] 97.6 F (36.4 C) (05/27 0335) Pulse Rate:  [75-83] 75 (05/27 0335) Resp:  [16] 16 (05/27 0335) BP: (100-128)/(64-75) 117/73 mmHg (05/27 0335) SpO2:  [96 %-97 %] 97 % (05/27 0335) Weight:  [181 lb (82.101 kg)] 181 lb (82.101 kg) (05/27 0335) Last BM Date: 02/28/14 General:  Alert, Well-developed, in NAD Heart:  Regular rate and rhythm; no murmurs Pulm:  CTAB.  No W/R/R. Abdomen:  Soft, non-distended. Normal bowel sounds.  Diffuse TTP without R/R/G.  Multiple scars noted on abdomen. Extremities:  Without edema. Neurologic:  Alert and  oriented x4;  grossly normal neurologically. Psych:  Alert and cooperative. Normal mood and affect.  Lab Results:  Recent Labs  02/26/14 0504 02/27/14 0622 02/28/14 0730  WBC 1.8* 4.6 4.0  HGB 10.2* 9.9* 10.7*  HCT 30.4* 28.9* 31.8*  PLT 22* 27* 23*   BMET  Recent Labs  02/26/14 2359 02/27/14 0622 02/28/14 0730  NA 129* 134* 138  K 4.2 3.7 4.2  CL 98 101 105  CO2 22 24 26   GLUCOSE 574* 283* 250*  BUN 15 14 11   CREATININE 0.88 0.91 0.74  CALCIUM 8.3* 8.5 8.8   Assessment / Plan: 1. 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 Although baseline thrombocytopenia is likely from portal hypertension with splenomegaly, her platelet count dropped precipitously - recommend primary service evaluate for other causes.  2. 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 On full liquids. GI pathogen panel is negative. Zosyn was discontinued and she was switched to Flagyl and Rocephin; can likely discontinue these since stool studies are negative.  3.  Anemia. Trend Hgb which is currently stable at 10.7 grams  4. Hyperglycemia. Possibly increased from corticosteroids. Per primary service.  5. Leukopenia. WBC count improved.  *Ok for discharge from GI standpoint.  Will advance diet to low fiber/low residue.  Can discontinue antibiotics.  Place her on prednisone 40 mg daily for the next two weeks until her follow-up with Dr. Olevia Perches on 6/9 at which time they can be tapered.    LOS: 5 days   Laban Emperor. Zehr  02/28/2014, 10:44 AM  Pager number [phone]   GI ATTENDING  Interval history the reviewed. Patient personally seen and examined. Not a conversational as, but no acute problems. No significant pain. Minimal loose stools. Stool studies negative. White blood cell count improved. Tolerating diet. Okay to discharge on prednisone 40 mg daily until her followup GI appointment with Dr. Olevia Perches. May use Imodium as needed for diarrhea. Okay for discharge home. Long-term, Entocort may be helpful. She can discuss that office visit therapeutic options. Thank you    Docia Chuck. Geri Seminole., M.D. [facility] Division of Gastroenterology

## 2014-02-28 NOTE — Progress Notes (Signed)
Inpatient Diabetes Program Recommendations  AACE/ADA: New Consensus Statement on Inpatient Glycemic Control (2013)  Target Ranges:  Prepandial:   less than 140 mg/dL      Peak postprandial:   less than 180 mg/dL (1-2 hours)      Critically ill patients:  140 - 180 mg/dL   Results for Shenoy, Minela (MRN [id number]) as of 02/28/2014 09:54  Ref. Range 02/27/2014 08:01 02/27/2014 12:19 02/27/2014 17:58 02/27/2014 21:06 02/28/2014 08:15  Glucose-Capillary Latest Range: 70-99 mg/dL 237 (H) 261 (H) 332 (H) 364 (H) 191 (H)   Diabetes history: DM2  Outpatient Diabetes medications: Humalog 20 units QID (with meals and at bedtime)  Current orders for Inpatient glycemic control: Lantus 25 units BID, Novolog 0-20 units AC  Inpatient Diabetes Program Recommendations Correction (SSI): Noted bedtime glucose 364 mg/dl and no correction ordered. Please consider ordering Novolog bedtime correction scale. Insulin - Meal Coverage: Post prandial glucose consistently elevated. Please consider ordering Novolog 6 units TID with meals for meal coverage.  Thanks, Barnie Alderman, RN, MSN, CCRN Diabetes Coordinator Inpatient Diabetes Program [phone] (Team Pager) [phone] ([facility] office) [phone] [facility] office)

## 2014-02-28 NOTE — Progress Notes (Signed)
  I have directly reviewed the clinical findings, lab, imaging studies and management of this patient in detail. I have interviewed and examined the patient and agree with the documentation,  as recorded by the Physician extender.  Thurnell Lose M.D on 02/28/2014 at 3:13 PM  [facility] Office  [phone]

## 2014-02-28 NOTE — Progress Notes (Signed)
PATIENT DETAILS Name: [name] Age: 68 y.o. Sex: female Date of Birth: 06-02-1946 Admit Date: 02/23/2014 Admitting Physician Phillips Grout, MD ZOX:WRUE Plotnikov, MD  Subjective: Reports 4 small bowel movements before 10:00 am this morning.    Assessment/Plan: Principal Problem: Acute Distal Ileitis -has hx of Crohn's disease-not on any maintenance medications. Likely Crohn's flare -Seen by GI, started on IV Solumedrol on 5/24,was on empiric Zosyn-since 5/22-however given worsening thrombocytopenia-will change to Rocephin/Flagyl on 5/26, await GI pathogen panel. C Diff PCR negative. -Have requested RN to document all stools as patient is likely a poor historian. -Per GI:  Progress to prednisone 40 daily until seen in the office by Dr. Olevia Perches.  Imodium as needed for diarrhea.  Acute Renal Failure -likely secondary to diarrhea -resolved with IVF -monitor lytes periodically  Liver Cirrhosis -reviewed prior records-Hx of NASH -appears stable at present -hold Lactulose/Aldactone/Demadex for now-as with diarrhea  Pancytopenia -seecondary to above -chronic issue-platelets dropping-monitor-have changed Zosyn to Rocephin and Flagyl, stopped protonix 5/26. -platelets back up to 23 on 5/27.    DEHYDRATION (VOLUME DEPLETION) -better-with IVF  DM-Uncontrolled -uncontrolled likely secondary to steroids -change SSI to resistant, change Lantus to 27 units BID-monitor CBG's and adjust accordingly -A1C is 10.3  Anxiety/Fibromyalgia -c/w Wellbutrin, Celexa and Gabapentin -start prn Xanax  Obesity -counseled extensively  Disposition: Remain inpatient.  D/c 5/28  DVT Prophylaxis: SCD's  Code Status: Full code   Family Communication None at bedside  Procedures:  None  CONSULTS:  GI   MEDICATIONS: Scheduled Meds: . cefTRIAXone (ROCEPHIN)  IV  1 g Intravenous Q24H  . cholecalciferol  1,000 Units Oral Daily  . citalopram  10 mg Oral Daily  . feeding  supplement (RESOURCE BREEZE)  1 Container Oral TID BM  . insulin aspart  0-20 Units Subcutaneous TID WC  . insulin glargine  25 Units Subcutaneous BID  . metroNIDAZOLE  500 mg Oral 3 times per day  . mupirocin ointment  1 application Nasal BID  . [START ON 03/01/2014] predniSONE  40 mg Oral Q breakfast   Continuous Infusions:   PRN Meds:.ALPRAZolam, diphenhydrAMINE, gabapentin, loperamide, morphine injection, ondansetron (ZOFRAN) IV, ondansetron, oxyCODONE  Antibiotics: Anti-infectives   Start     Dose/Rate Route Frequency Ordered Stop   02/27/14 1400  metroNIDAZOLE (FLAGYL) tablet 500 mg     500 mg Oral 3 times per day 02/27/14 1006     02/27/14 1200  cefTRIAXone (ROCEPHIN) 1 g in dextrose 5 % 50 mL IVPB     1 g 100 mL/hr over 30 Minutes Intravenous Every 24 hours 02/27/14 1006     02/24/14 0600  piperacillin-tazobactam (ZOSYN) IVPB 3.375 g  Status:  Discontinued     3.375 g 12.5 mL/hr over 240 Minutes Intravenous 3 times per day 02/24/14 0237 02/27/14 0941   02/23/14 2315  piperacillin-tazobactam (ZOSYN) IVPB 3.375 g     3.375 g 100 mL/hr over 30 Minutes Intravenous  Once 02/23/14 2304 02/24/14 0055       PHYSICAL EXAM: Vital signs in last 24 hours: Filed Vitals:   02/27/14 1409 02/27/14 1955 02/28/14 0335 02/28/14 1351  BP: 128/75 100/64 117/73 123/74  Pulse: 83 82 75 85  Temp: 98 F (36.7 C) 98.5 F (36.9 C) 97.6 F (36.4 C) 98.3 F (36.8 C)  TempSrc: Oral Oral Oral Oral  Resp: 16 16 16 16   Height:      Weight:   82.101 kg (181 lb)   SpO2: 97%  96% 97% 98%    Weight change: 0 kg (0 lb) Filed Weights   02/26/14 0436 02/27/14 0506 02/28/14 0335  Weight: 83.008 kg (183 lb) 82.101 kg (181 lb) 82.101 kg (181 lb)   Body mass index is 32.07 kg/(m^2).   Gen Exam: Awake and alert with clear speech.  Well appearing eating a full liquid breakfast. Neck: Supple, No JVD.   Chest: B/L Clear.  No increased work of breathing. CVS: S1 S2 Regular, no murmurs.  Abdomen: soft,  BS +, non tender, non distended. No masses Extremities: mild bilateral edema, lower extremities warm to touch. Neurologic: Non Focal.   Skin: No Rash.      LAB RESULTS: CBC  Recent Labs Lab 02/24/14 0430 02/25/14 0740 02/26/14 0504 02/27/14 0622 02/28/14 0730  WBC 3.9* 2.2* 1.8* 4.6 4.0  HGB 11.2* 9.9* 10.2* 9.9* 10.7*  HCT 32.2* 28.6* 30.4* 28.9* 31.8*  PLT 38* 26* 22* 27* 23*  MCV 89.2 90.2 91.8 90.9 92.2  MCH 31.0 31.2 30.8 31.1 31.0  MCHC 34.8 34.6 33.6 34.3 33.6  RDW 18.4* 18.2* 18.2* 18.5* 19.0*  LYMPHSABS  --   --   --  0.6*  --   MONOABS  --   --   --  0.3  --   EOSABS  --   --   --  0.2  --   BASOSABS  --   --   --  0.0  --     Chemistries   Recent Labs Lab 02/25/14 0740 02/26/14 0504 02/26/14 2359 02/27/14 0622 02/28/14 0730  NA 137 135* 129* 134* 138  K 3.2* 4.0 4.2 3.7 4.2  CL 104 104 98 101 105  CO2 26 22 22 24 26   GLUCOSE 195* 475* 574* 283* 250*  BUN 19 15 15 14 11   CREATININE 0.99 0.82 0.88 0.91 0.74  CALCIUM 8.3* 8.1* 8.3* 8.5 8.8  MG  --  1.6  --  1.8  --     CBG:  Recent Labs Lab 02/27/14 1219 02/27/14 1758 02/27/14 2106 02/28/14 0815 02/28/14 1204  GLUCAP 261* 332* 364* 191* 235*     Coagulation profile  Recent Labs Lab 02/23/14 1723 02/24/14 0430  INR 1.58* 1.47     Recent Labs  02/27/14 1317  HGBA1C 10.3*    MICROBIOLOGY: Recent Results (from the past 240 hour(s))  STOOL CULTURE     Status: None   Collection Time    02/23/14 11:07 PM      Result Value Ref Range Status   Specimen Description STOOL   Final   Special Requests NONE   Final   Culture     Final   Value: NO SALMONELLA, SHIGELLA, CAMPYLOBACTER, YERSINIA, OR E.COLI 0157:H7 ISOLATED     Performed at [facility]   Report Status 02/27/2014 FINAL   Final  CLOSTRIDIUM DIFFICILE BY PCR     Status: None   Collection Time    02/23/14 11:07 PM      Result Value Ref Range Status   C difficile by pcr NEGATIVE  NEGATIVE Final  MRSA PCR  SCREENING     Status: None   Collection Time    02/24/14  2:26 AM      Result Value Ref Range Status   MRSA by PCR NEGATIVE  NEGATIVE Final   Comment:            The GeneXpert MRSA Assay (FDA     approved for NASAL specimens  only), is one component of a     comprehensive MRSA colonization     surveillance program. It is not     intended to diagnose MRSA     infection nor to guide or     monitor treatment for     MRSA infections.    RADIOLOGY STUDIES/RESULTS: Ct Abdomen Pelvis Wo Contrast  02/23/2014   CLINICAL DATA:  Left-sided abdominal pain.  EXAM: CT ABDOMEN AND PELVIS WITHOUT CONTRAST  TECHNIQUE: Multidetector CT imaging of the abdomen and pelvis was performed following the standard protocol without IV contrast.  COMPARISON:  CT of the abdomen and pelvis from 08/27/2010  FINDINGS: A few calcified granulomata are seen at the lung bases. Dense calcification is noted at the mitral valve.  Small volume ascites is noted about the liver and spleen. Mild mesenteric edema is noted.  Diffuse cirrhotic change is noted, with extensive nodularity of the liver. The spleen is enlarged, measuring 17.5 cm in length. The patient is status post cholecystectomy, with clips noted at the gallbladder fossa. The pancreas and adrenal glands are unremarkable in appearance.  Prominent nonobstructing bilateral renal stones are seen, measuring up to 6 mm in size. There is no evidence of hydronephrosis. A 2.5 cm cyst is noted at the upper pole of the left kidney. Bilateral renal pelvicaliectasis remains within normal limits. No obstructing ureteral stones are seen. Mild nonspecific perinephric stranding is noted bilaterally.  Trace free fluid is noted tracking about small bowel loops. There is segmental wall thickening of a short segment of distal ileum at the lower abdomen, measuring up to 7 cm in length. This could reflect an infectious process, though malignancy cannot be entirely excluded. Would correlate with the  patient's symptoms and consider capsule endoscopy for further evaluation.  Trace postoperative fluid is noted along the midline abdomen.  The stomach is within normal limits. No acute vascular abnormalities are seen. Minimal calcification is seen along the abdominal aorta. Mild calcification is also seen along the portal venous system.  The appendix is not definitely seen. The terminal ileum appears mildly thickened, though this may be reactive in nature due to mild omental inflammation. The colon is grossly unremarkable in appearance.  The bladder is moderately distended and grossly unremarkable. The patient is status post hysterectomy. No suspicious adnexal masses are seen. No inguinal lymphadenopathy is seen.  No acute osseous abnormalities are identified.  IMPRESSION: 1. Segmental wall thickening of a short segment of distal ileum at the lower abdomen, measuring up to 7 cm in length. This could reflect an infectious process, though malignancy cannot be entirely excluded. Would correlate with the patient's symptoms and consider capsule endoscopy for further evaluation, when and as deemed clinically appropriate. 2. Terminal ileum appears mildly thickened, though this may be reactive in nature due to mild underlying omental inflammation. 3. Small volume ascites about the liver and spleen, with mild mesenteric edema. 4. Diffuse cirrhotic change noted; splenomegaly seen. 5. Prominent nonobstructing bilateral renal stones again noted, measuring up to 6 mm in size. Left renal cyst seen. 6. Trace focal postoperative fluid noted along the midline abdomen. 7. Dense calcification noted at the mitral valve.   Electronically Signed   By: Garald Balding M.D.   On: 02/23/2014 22:14   Dg Chest 2 View  02/23/2014   CLINICAL DATA:  Cough.  EXAM: CHEST  2 VIEW  COMPARISON:  10/29/2013 and 06/24/2014 as well as chest CT 10/29/2013  FINDINGS: The lungs are adequately inflated without focal consolidation or  effusion. There are a  few small nodules over the right mid to upper lung compatible calcified granulomas on as seen on chest CT. There is minimal prominence of the perihilar markings suggesting mild vascular congestion. No effusion. There is mild stable cardiomegaly. There are degenerative changes of the spine with a stable mild compression fracture at the thoracolumbar junction.  IMPRESSION: Suggestion of mild vascular congestion with mild stable cardiomegaly.  Stable compression fracture over the thoracolumbar junction.   Electronically Signed   By: Marin Olp M.D.   On: 02/23/2014 16:06    Karen Kitchens [facility] Pager:[phone]  If 7PM-7AM, please contact night-coverage [email or web address] Password [email or web address] 02/28/2014, 2:57 PM   LOS: 5 days   **Disclaimer: This note may have been dictated with voice recognition software. Similar sounding words can inadvertently be transcribed and this note may contain transcription errors which may not have been corrected upon publication of note.**

## 2014-03-01 LAB — CBC
HEMATOCRIT: 32.2 % — AB (ref 36.0–46.0)
Hemoglobin: 10.9 g/dL — ABNORMAL LOW (ref 12.0–15.0)
MCH: 30.9 pg (ref 26.0–34.0)
MCHC: 33.9 g/dL (ref 30.0–36.0)
MCV: 91.2 fL (ref 78.0–100.0)
Platelets: 30 10*3/uL — ABNORMAL LOW (ref 150–400)
RBC: 3.53 MIL/uL — ABNORMAL LOW (ref 3.87–5.11)
RDW: 18.9 % — ABNORMAL HIGH (ref 11.5–15.5)
WBC: 4.4 10*3/uL (ref 4.0–10.5)

## 2014-03-01 LAB — GLUCOSE, CAPILLARY
GLUCOSE-CAPILLARY: 267 mg/dL — AB (ref 70–99)
GLUCOSE-CAPILLARY: 229 mg/dL — AB (ref 70–99)

## 2014-03-01 MED ORDER — LOPERAMIDE HCL 2 MG PO CAPS: 2.0000 mg | ORAL_CAPSULE | ORAL | Status: DC | PRN

## 2014-03-01 MED ORDER — PREDNISONE 20 MG PO TABS: 40.0000 mg | ORAL_TABLET | Freq: Every day | ORAL | Status: DC

## 2014-03-01 MED ORDER — INSULIN LISPRO 100 UNIT/ML (KWIKPEN): 10.0000 [IU] | PEN_INJECTOR | Freq: Three times a day (TID) | SUBCUTANEOUS | Status: DC

## 2014-03-01 MED ORDER — INSULIN GLARGINE 100 UNIT/ML ~~LOC~~ SOLN: 15.0000 [IU] | Freq: Every day | SUBCUTANEOUS | Status: DC

## 2014-03-01 MED ORDER — INSULIN GLARGINE 100 UNIT/ML SOLOSTAR PEN: 40.0000 [IU] | PEN_INJECTOR | Freq: Every day | SUBCUTANEOUS | Status: DC

## 2014-03-01 NOTE — Discharge Instructions (Signed)
Take 40 mg of prednisone daily until follow up with Dr. Olevia Perches.  Follow up with your primary care in 1 week.  Your blood sugar will be high particularly while you are on prednisone.  Please take your insulin as prescribed.  Lantus (a long acting insulin) has been prescribed for you. Please take 15 units at bedtime.  Please check your blood sugar before each meal and at bedtime.  Keep a log to show to your primary care physician.

## 2014-03-01 NOTE — Progress Notes (Signed)
Spoke with patient about diabetes and home regimen for diabetes control. Patient reports that she is followed by her PCP (Dr. Alain Marion) for diabetes management and currently she is prescribed Humalog 20 units QID for outpatient diabetes control.  However, in talking with the patient she reports that she actually takes Humalog 20 units TID (around 11am, around 2-3 pm, and again around 7-9 pm which is her bedtime) as an outpatient for diabetes control.  Inquired about knowledge about A1C and patient reports that she does not know what an A1C is. Discussed A1C results (10.3% on 02/27/14) and explained what an A1C is, basic pathophysiology of DM Type 2, basic home care, importance of checking CBGs and maintaining good CBG control to prevent long-term and short-term complications. In talking with the patient she reports that she checks her blood sugars 2-3 times per day and it ranges from 45-200's mg/dl.  Inquired about low blood sugars and patient states that she has at least 2-3 lows per week and she sometimes has lows during the night and has to get up and get something to eat or drink to bring her sugar up. Asked that patient keep a log book of blood glucose results and be sure to take her glucometer and her log book with her to her follow up appointment with Dr. Alain Marion.  Explained to patient that she would be discharged on basal insulin as well as resuming Humalog insulin. Discussed onset and duration on Lantus and Humalog. Patient verbalized understanding of information discussed and she states that she has no further questions at this time related to diabetes.   Talked with Cathlean Cower, PA to request changes in discharge insulin (recommend Lantus 40 units QHS and Humalog 10 units TID with meals if patient eats a meal).  Changes were made as requested.  Stressed to patient that she needs to be sure that she understands discharge instructions when the nurse goes over them at discharge.  Thanks, Barnie Alderman, RN, MSN, CCRN Diabetes Coordinator Inpatient Diabetes Program [phone] (Team Pager) [phone] ([facility] office) [phone] [facility] office)

## 2014-03-01 NOTE — Progress Notes (Signed)
Physical Therapy Treatment Patient Details Name: [name] MRN: [id number] DOB: October 26, 1945 Today's Date: 03/01/2014    History of Present Illness 68 yo female h/o nonalcoholic cirrhosis, R ankle fx, fibromyalgia comes in with 2 days of diarrhea was initially bloody red, but is now more melanotic with associated llq abd pain. Dx of colitis, crohn's exacerbation.    PT Comments    Pt progressing mobility. Addressed stair goal this session prior to D/C home today. Pt at supervision level for all mobility at this time. Is safe from mobility standpoint to D/C home with family.   Follow Up Recommendations  No PT follow up     Equipment Recommendations  None recommended by PT    Recommendations for Other Services       Precautions / Restrictions Precautions Precautions: None Restrictions Weight Bearing Restrictions: No    Mobility  Bed Mobility Overal bed mobility: Independent             General bed mobility comments: pt able to transfer in/out of bed without use of handrails or physical (A)  Transfers Overall transfer level: Modified independent Equipment used: None             General transfer comment: use of bed to push up to stand; no LOB noted  Ambulation/Gait Ambulation/Gait assistance: Supervision Ambulation Distance (Feet): 220 Feet Assistive device: None Gait Pattern/deviations: Narrow base of support;Decreased stride length;Shuffle Gait velocity: decreased Gait velocity interpretation: Below normal speed for age/gender General Gait Details: no LOB noted   Stairs Stairs: Yes Stairs assistance: Supervision Stair Management: Two rails;Alternating pattern;Forwards Number of Stairs: 2 General stair comments: sueprvision for cues and for safety; no LOB noted   Wheelchair Mobility    Modified Rankin (Stroke Patients Only)       Balance Overall balance assessment: Needs assistance Sitting-balance support: Feet supported;No upper extremity  supported Sitting balance-Leahy Scale: Good     Standing balance support: No upper extremity supported Standing balance-Leahy Scale: Good Standing balance comment: no LOB noted                    Cognition Arousal/Alertness: Awake/alert Behavior During Therapy: WFL for tasks assessed/performed Overall Cognitive Status: Within Functional Limits for tasks assessed                      Exercises     General Comments        Pertinent Vitals/Pain No c/o pain    Home Living                      Prior Function            PT Goals (current goals can now be found in the care plan section) Acute Rehab PT Goals Patient Stated Goal: home today PT Goal Formulation: With patient Time For Goal Achievement: 03/13/14 Potential to Achieve Goals: Good    Frequency  Min 3X/week    PT Plan      Co-evaluation             End of Session Equipment Utilized During Treatment: Gait belt Activity Tolerance: Patient tolerated treatment well Patient left: in chair;with call bell/phone within reach;with chair alarm set     Time: 1358-1415 PT Time Calculation (min): 17 min  Charges:  $Gait Training: 8-22 mins                    G Codes:  [place], [place] 03/01/2014, 3:45 PM

## 2014-03-01 NOTE — Progress Notes (Signed)
Patient discharge teaching given, including activity, diet, follow-up appoints, and medications. Patient verbalized understanding of all discharge instructions. IV access was d/c'd. Vitals are stable. Skin is intact except as charted in most recent assessments. Pt to be escorted out by NT, to be driven home by family. 

## 2014-03-01 NOTE — Discharge Summary (Signed)
  I have directly reviewed the clinical findings, lab, imaging studies and management of this patient in detail. I have interviewed and examined the patient and agree with the documentation,  as recorded by the Physician extender.  Thurnell Lose M.D on 03/01/2014 at 2:13 PM  [facility] Office  [phone]

## 2014-03-01 NOTE — Discharge Summary (Signed)
Physician Discharge Summary  Kayren Holck JOA:[id number] DOB: 1946-09-24 DOA: 02/23/2014  PCP: Walker Kehr, MD  Admit date: 02/23/2014 Discharge date: 03/01/2014  Time spent: 50 minutes  Recommendations for Outpatient Follow-up:  1. Prednisone daily (40 mg) until seen by Dr. Olevia Perches 2. Please follow BMET - on diuretics 3. 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 4.   Uncontrolled diabetes. Insulin regimen adjusted and lantus added at bedtime.  Discharge Diagnoses:  Principal Problem:   Colitis, acute Active Problems:   DEHYDRATION (VOLUME DEPLETION)   THROMBOCYTOPENIA   CROHN'S DISEASE-LARGE INTESTINE   CIRRHOSIS   Fibromyalgia syndrome   Bloody diarrhea   Renal insufficiency, mild   Abdominal pain, acute, left lower quadrant   GI bleed   Acute colitis   Regional enteritis of small intestine with large intestine   Discharge Condition: stable.  Diet recommendation: bland, soft.  Filed Weights   02/27/14 0506 02/28/14 0335 03/01/14 0450  Weight: 82.101 kg (181 lb) 82.101 kg (181 lb) 82.101 kg (181 lb)    History of present illness:  68 yo female with history of NASH, fibromyalgia and Crohns presents with 2 days of increased diarrhea that was initially bloody. CT showed colitis with possible underlying mass.  Hospital Course:  Acute Distal Ileitis with acute blood loss anemia. -has hx of Crohn's disease-not on any maintenance medications. Likely Crohn's flare  -Seen by GI, started on IV Solumedrol on 5/24,was on empiric Zosyn-since 5/22-however given worsening thrombocytopenia, she was changed to Rocephin/Flagyl on 5/26. C Diff PCR negative. GI pathogen panel was negative. Antibiotics were discontinued on discharge. -Hemoglobin is stable and trending up at the time of discharge.  No blood transfusion was required. -Per GI: Progress to prednisone 40 daily until seen in the office by Dr. Olevia Perches. Imodium as needed for diarrhea.   Acute Renal  Failure  -likely secondary to diarrhea  -resolved with IVF  -monitor lytes periodically outpatient, particularly given diuretic therapy.  Liver Cirrhosis  -reviewed prior records-Hx of NASH  -appears stable at present   Pancytopenia  -seecondary to above  -chronic issue-platelets dropping-monitored closely inpatient.  Changed Zosyn to Rocephin and Flagyl, stopped protonix 5/26.  -platelets trending up at the time of discharge (30).  DEHYDRATION (VOLUME DEPLETION)  -better-with IVF   DM-Uncontrolled  -uncontrolled likely secondary to steroids  -changed  SSI to resistant, and Lantus to 27 units BID.  CBGs were still elevated  -She will be prescribed Lantus 40 units qhs and humalog 10 units TID AC at discharge. -A1C is 10.3   Anxiety/Fibromyalgia  -c/w Wellbutrin, Celexa and Gabapentin  -prn Xanax as an inpatient.  Will not discharge on Xanax.  Obesity  -counseled extensively   Consultations:  [facility]  Discharge Exam: Filed Vitals:   03/01/14 0450  BP: 132/77  Pulse: 82  Temp: 98.6 F (37 C)  Resp: 16    Gen Exam: Awake and alert with clear speech. Lying comfortably in bed. Neck: Supple, No JVD.  Chest: B/L Clear. No increased work of breathing.  CVS: S1 S2 Regular, no murmurs.  Abdomen: soft, BS +, non tender, non distended. No masses  Extremities: mild bilateral edema, lower extremities warm to touch.  Neurologic: Non Focal.  Skin: No Rash.    Discharge Instructions       Discharge Instructions   Diet - low sodium heart healthy    Complete by:  As directed      Increase activity slowly    Complete by:  As directed  Medication List         ACCU-CHEK COMPACT TEST DRUM test strip  Generic drug:  glucose blood  USE 4 TIMES A DAY AS DIRECTED     glucose blood test strip  Commonly known as:  FREESTYLE LITE  Use as directed to check blood sugar twice daily dx 250.00     B-D UF III MINI PEN NEEDLES 31G X 5 MM Misc  Generic drug:   Insulin Pen Needle  as directed.     buPROPion 150 MG 12 hr tablet  Commonly known as:  WELLBUTRIN SR  TAKE 1 TABLET EVERY MORNING     cholecalciferol 1000 UNITS tablet  Commonly known as:  VITAMIN D  Take 1,000 Units by mouth daily.     citalopram 10 MG tablet  Commonly known as:  CELEXA  Take 10 mg by mouth daily.     freestyle lancets  1 each by Other route 2 (two) times daily as needed. Use as instructed     gabapentin 800 MG tablet  Commonly known as:  NEURONTIN  Take 1 tablet (800 mg total) by mouth 4 (four) times daily as needed (leg pain).     Insulin Glargine 100 UNIT/ML Solostar Pen  Commonly known as:  LANTUS  Inject 40 Units into the skin daily at 10 pm.     insulin lispro 100 UNIT/ML KiwkPen  Commonly known as:  HUMALOG KWIKPEN  Inject 0.1 mLs (10 Units total) into the skin 3 (three) times daily before meals.     ketoconazole 2 % cream  Commonly known as:  NIZORAL  Apply 1 application topically daily.     lactulose 10 GM/15ML solution  Commonly known as:  CHRONULAC  TAKE 30ML'S BY MOUTH TWO TO THREE TIMES A WEEK     loperamide 2 MG capsule  Commonly known as:  IMODIUM  Take 1 capsule (2 mg total) by mouth as needed for diarrhea or loose stools.     mupirocin ointment 2 %  Commonly known as:  BACTROBAN  Place 1 application into the nose 2 (two) times daily. With dressing  change     predniSONE 20 MG tablet  Commonly known as:  DELTASONE  Take 2 tablets (40 mg total) by mouth daily with breakfast.     spironolactone 100 MG tablet  Commonly known as:  ALDACTONE  Take 100 mg by mouth daily.     terconazole 0.4 % vaginal cream  Commonly known as:  TERAZOL 7  Place 1 applicator vaginally at bedtime.     torsemide 20 MG tablet  Commonly known as:  DEMADEX  TAKE 2 TABLETS BY MOUTH DAILY AS NEEDED FOR SWELLING     traMADol 50 MG tablet  Commonly known as:  ULTRAM  TAKE 1 TABLET BY MOUTH EVERY 8 HOURS AS NEEDED FOR PAIN     triamcinolone cream 0.5  %  Commonly known as:  KENALOG  Apply topically 3 (three) times daily. To the rash on R leg     VOLTAREN 1 % Gel  Generic drug:  diclofenac sodium  Apply 1 application topically 4 (four) times daily.       Allergies  Allergen Reactions  . Amoxicillin-Pot Clavulanate Diarrhea    REACTION: diarrhea  . Ciprofloxacin Other (See Comments)    REACTION: swelling of LE  . Duragesic Disc Transdermal System [Alcohol-Fentanyl] Itching    Itching from patch  . Metformin Diarrhea  . Sulfamethoxazole-Trimethoprim Swelling    REACTION: leg edema  .  Tetracycline Hcl Itching  . Tramadol Other (See Comments)    Headache  . Azithromycin Rash   Follow-up Information   Follow up with Delfin Edis, MD On 03/13/2014. (2:00 pm)    Specialty:  Gastroenterology   Contact information:   [address] [place] [address] [phone]       Follow up with Walker Kehr, MD In 1 week. (follow platelets and diabetes.)    Specialty:  Internal Medicine   Contact information:   [place]  [address] [phone]        The results of significant diagnostics from this hospitalization (including imaging, microbiology, ancillary and laboratory) are listed below for reference.    Significant Diagnostic Studies: Ct Abdomen Pelvis Wo Contrast  02/23/2014   CLINICAL DATA:  Left-sided abdominal pain.  EXAM: CT ABDOMEN AND PELVIS WITHOUT CONTRAST  TECHNIQUE: Multidetector CT imaging of the abdomen and pelvis was performed following the standard protocol without IV contrast.  COMPARISON:  CT of the abdomen and pelvis from 08/27/2010  FINDINGS: A few calcified granulomata are seen at the lung bases. Dense calcification is noted at the mitral valve.  Small volume ascites is noted about the liver and spleen. Mild mesenteric edema is noted.  Diffuse cirrhotic change is noted, with extensive nodularity of the liver. The spleen is enlarged, measuring 17.5 cm in length. The patient is status post  cholecystectomy, with clips noted at the gallbladder fossa. The pancreas and adrenal glands are unremarkable in appearance.  Prominent nonobstructing bilateral renal stones are seen, measuring up to 6 mm in size. There is no evidence of hydronephrosis. A 2.5 cm cyst is noted at the upper pole of the left kidney. Bilateral renal pelvicaliectasis remains within normal limits. No obstructing ureteral stones are seen. Mild nonspecific perinephric stranding is noted bilaterally.  Trace free fluid is noted tracking about small bowel loops. There is segmental wall thickening of a short segment of distal ileum at the lower abdomen, measuring up to 7 cm in length. This could reflect an infectious process, though malignancy cannot be entirely excluded. Would correlate with the patient's symptoms and consider capsule endoscopy for further evaluation.  Trace postoperative fluid is noted along the midline abdomen.  The stomach is within normal limits. No acute vascular abnormalities are seen. Minimal calcification is seen along the abdominal aorta. Mild calcification is also seen along the portal venous system.  The appendix is not definitely seen. The terminal ileum appears mildly thickened, though this may be reactive in nature due to mild omental inflammation. The colon is grossly unremarkable in appearance.  The bladder is moderately distended and grossly unremarkable. The patient is status post hysterectomy. No suspicious adnexal masses are seen. No inguinal lymphadenopathy is seen.  No acute osseous abnormalities are identified.  IMPRESSION: 1. Segmental wall thickening of a short segment of distal ileum at the lower abdomen, measuring up to 7 cm in length. This could reflect an infectious process, though malignancy cannot be entirely excluded. Would correlate with the patient's symptoms and consider capsule endoscopy for further evaluation, when and as deemed clinically appropriate. 2. Terminal ileum appears mildly  thickened, though this may be reactive in nature due to mild underlying omental inflammation. 3. Small volume ascites about the liver and spleen, with mild mesenteric edema. 4. Diffuse cirrhotic change noted; splenomegaly seen. 5. Prominent nonobstructing bilateral renal stones again noted, measuring up to 6 mm in size. Left renal cyst seen. 6. Trace focal postoperative fluid noted along the  midline abdomen. 7. Dense calcification noted at the mitral valve.   Electronically Signed   By: Garald Balding M.D.   On: 02/23/2014 22:14   Dg Chest 2 View  02/23/2014   CLINICAL DATA:  Cough.  EXAM: CHEST  2 VIEW  COMPARISON:  10/29/2013 and 06/24/2014 as well as chest CT 10/29/2013  FINDINGS: The lungs are adequately inflated without focal consolidation or effusion. There are a few small nodules over the right mid to upper lung compatible calcified granulomas on as seen on chest CT. There is minimal prominence of the perihilar markings suggesting mild vascular congestion. No effusion. There is mild stable cardiomegaly. There are degenerative changes of the spine with a stable mild compression fracture at the thoracolumbar junction.  IMPRESSION: Suggestion of mild vascular congestion with mild stable cardiomegaly.  Stable compression fracture over the thoracolumbar junction.   Electronically Signed   By: Marin Olp M.D.   On: 02/23/2014 16:06    Microbiology: Recent Results (from the past 240 hour(s))  STOOL CULTURE     Status: None   Collection Time    02/23/14 11:07 PM      Result Value Ref Range Status   Specimen Description STOOL   Final   Special Requests NONE   Final   Culture     Final   Value: NO SALMONELLA, SHIGELLA, CAMPYLOBACTER, YERSINIA, OR E.COLI [id number]:H7 ISOLATED     Performed at [facility]   Report Status 02/27/2014 FINAL   Final  CLOSTRIDIUM DIFFICILE BY PCR     Status: None   Collection Time    02/23/14 11:07 PM      Result Value Ref Range Status   C difficile by pcr  NEGATIVE  NEGATIVE Final  MRSA PCR SCREENING     Status: None   Collection Time    02/24/14  2:26 AM      Result Value Ref Range Status   MRSA by PCR NEGATIVE  NEGATIVE Final   Comment:            The GeneXpert MRSA Assay (FDA     approved for NASAL specimens     only), is one component of a     comprehensive MRSA colonization     surveillance program. It is not     intended to diagnose MRSA     infection nor to guide or     monitor treatment for     MRSA infections.     Labs: Basic Metabolic Panel:  Recent Labs Lab 02/25/14 0740 02/26/14 0504 02/26/14 2359 02/27/14 0622 02/28/14 0730  NA 137 135* 129* 134* 138  K 3.2* 4.0 4.2 3.7 4.2  CL 104 104 98 101 105  CO2 26 22 22 24 26   GLUCOSE 195* 475* 574* 283* 250*  BUN 19 15 15 14 11   CREATININE 0.99 0.82 0.88 0.91 0.74  CALCIUM 8.3* 8.1* 8.3* 8.5 8.8  MG  --  1.6  --  1.8  --    Liver Function Tests:  Recent Labs Lab 02/23/14 1334 02/25/14 0740  AST 42* 34  ALT 25 22  ALKPHOS 85 71  BILITOT 2.6* 1.6*  PROT 6.9 5.9*  ALBUMIN 2.2* 1.8*    Recent Labs Lab 02/23/14 1723  LIPASE 30   CBC:  Recent Labs Lab 02/25/14 0740 02/26/14 0504 02/27/14 0622 02/28/14 0730 03/01/14 0600  WBC 2.2* 1.8* 4.6 4.0 4.4  NEUTROABS  --   --  3.5  --   --  HGB 9.9* 10.2* 9.9* 10.7* 10.9*  HCT 28.6* 30.4* 28.9* 31.8* 32.2*  MCV 90.2 91.8 90.9 92.2 91.2  PLT 26* 22* 27* 23* 30*    BNP (last 3 results)  Recent Labs  10/29/13 0631  PROBNP 200.8*   CBG:  Recent Labs Lab 02/28/14 1204 02/28/14 1704 02/28/14 2035 03/01/14 0757 03/01/14 1207  GLUCAP 235* 387* 397* 267* 229*     Signed:  Karen Kitchens [phone]  [facility] 03/01/2014, 2:10 PM

## 2014-03-06 ENCOUNTER — Ambulatory Visit (INDEPENDENT_AMBULATORY_CARE_PROVIDER_SITE_OTHER): Payer: Medicare PPO | Admitting: Internal Medicine

## 2014-03-06 ENCOUNTER — Other Ambulatory Visit (INDEPENDENT_AMBULATORY_CARE_PROVIDER_SITE_OTHER): Payer: Medicare PPO

## 2014-03-06 ENCOUNTER — Encounter: Payer: Self-pay | Admitting: Internal Medicine

## 2014-03-06 VITALS — BP 120/56 | HR 80 | Temp 98.8°F | Resp 16 | Wt 179.0 lb

## 2014-03-06 DIAGNOSIS — K5010 Crohn's disease of large intestine without complications: Secondary | ICD-10-CM

## 2014-03-06 DIAGNOSIS — K7460 Unspecified cirrhosis of liver: Secondary | ICD-10-CM

## 2014-03-06 DIAGNOSIS — R040 Epistaxis: Secondary | ICD-10-CM

## 2014-03-06 DIAGNOSIS — R197 Diarrhea, unspecified: Secondary | ICD-10-CM

## 2014-03-06 DIAGNOSIS — F329 Major depressive disorder, single episode, unspecified: Secondary | ICD-10-CM

## 2014-03-06 DIAGNOSIS — F3289 Other specified depressive episodes: Secondary | ICD-10-CM

## 2014-03-06 DIAGNOSIS — R0789 Other chest pain: Secondary | ICD-10-CM

## 2014-03-06 DIAGNOSIS — E119 Type 2 diabetes mellitus without complications: Secondary | ICD-10-CM

## 2014-03-06 LAB — HEPATIC FUNCTION PANEL
ALT: 35 U/L (ref 0–35)
AST: 38 U/L — ABNORMAL HIGH (ref 0–37)
Albumin: 2.1 g/dL — ABNORMAL LOW (ref 3.5–5.2)
Alkaline Phosphatase: 74 U/L (ref 39–117)
BILIRUBIN TOTAL: 2.5 mg/dL — AB (ref 0.2–1.2)
Bilirubin, Direct: 0.7 mg/dL — ABNORMAL HIGH (ref 0.0–0.3)
TOTAL PROTEIN: 5.6 g/dL — AB (ref 6.0–8.3)

## 2014-03-06 LAB — BASIC METABOLIC PANEL
BUN: 17 mg/dL (ref 6–23)
CO2: 29 meq/L (ref 19–32)
Calcium: 8.4 mg/dL (ref 8.4–10.5)
Chloride: 95 mEq/L — ABNORMAL LOW (ref 96–112)
Creatinine, Ser: 1.1 mg/dL (ref 0.4–1.2)
GFR: 51.16 mL/min — ABNORMAL LOW (ref >60.00)
GLUCOSE: 378 mg/dL — AB (ref 70–99)
POTASSIUM: 2.8 meq/L — AB (ref 3.5–5.1)
Sodium: 132 mEq/L — ABNORMAL LOW (ref 135–145)

## 2014-03-06 LAB — CBC WITH DIFFERENTIAL/PLATELET
Basophils Absolute: 0 10*3/uL (ref 0.0–0.1)
Basophils Relative: 0.1 % (ref 0.0–3.0)
EOS PCT: 4.8 % (ref 0.0–5.0)
Eosinophils Absolute: 0.2 10*3/uL (ref 0.0–0.7)
HCT: 35.3 % — ABNORMAL LOW (ref 36.0–46.0)
Hemoglobin: 11.8 g/dL — ABNORMAL LOW (ref 12.0–15.0)
Lymphocytes Relative: 10.7 % — ABNORMAL LOW (ref 12.0–46.0)
Lymphs Abs: 0.4 10*3/uL — ABNORMAL LOW (ref 0.7–4.0)
MCHC: 33.5 g/dL (ref 30.0–36.0)
MCV: 93.3 fl (ref 78.0–100.0)
Monocytes Absolute: 0.4 10*3/uL (ref 0.1–1.0)
Monocytes Relative: 10 % (ref 3.0–12.0)
NEUTROS PCT: 74.4 % (ref 43.0–77.0)
Neutro Abs: 2.6 10*3/uL (ref 1.4–7.7)
Platelets: 32 10*3/uL — CL (ref 150.0–400.0)
RBC: 3.79 Mil/uL — AB (ref 3.87–5.11)
RDW: 21 % — ABNORMAL HIGH (ref 11.5–15.5)
WBC: 3.5 10*3/uL — AB (ref 4.0–10.5)

## 2014-03-06 LAB — PROTIME-INR
INR: 1.5 ratio — ABNORMAL HIGH (ref 0.8–1.0)
Prothrombin Time: 17 s — ABNORMAL HIGH (ref 9.6–13.1)

## 2014-03-06 MED ORDER — CITALOPRAM HYDROBROMIDE 10 MG PO TABS: 10.0000 mg | ORAL_TABLET | Freq: Every day | ORAL | Status: AC

## 2014-03-06 NOTE — Assessment & Plan Note (Signed)
Ongoing CP - chronic

## 2014-03-06 NOTE — Assessment & Plan Note (Signed)
F/u w/Dr Olevia Perches pending On Prednisone   Exacerbation 5/15 - better

## 2014-03-06 NOTE — Patient Instructions (Signed)
Elevate feet, reduce salt intake

## 2014-03-06 NOTE — Assessment & Plan Note (Signed)
Continue with current prescription therapy as reflected on the Med list.  

## 2014-03-06 NOTE — Assessment & Plan Note (Signed)
5/15 Crohn's

## 2014-03-06 NOTE — Assessment & Plan Note (Signed)
Stopped

## 2014-03-06 NOTE — Assessment & Plan Note (Signed)
Watching labs 

## 2014-03-06 NOTE — Progress Notes (Signed)
Subjective:  F/u hosp stay:  Admit date: 02/23/2014  Discharge date: 03/01/2014  Time spent: 50 minutes  Recommendations for Outpatient Follow-up:  1. Prednisone daily (40 mg) until seen by Dr. Olevia Perches 2. Please follow BMET - on diuretics 3. 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 4. Uncontrolled diabetes. Insulin regimen adjusted and lantus added at bedtime.  Discharge Diagnoses:  Principal Problem:  Colitis, acute  Active Problems:  DEHYDRATION (VOLUME DEPLETION)  THROMBOCYTOPENIA  CROHN'S DISEASE-LARGE INTESTINE  CIRRHOSIS  Fibromyalgia syndrome  Bloody diarrhea  Renal insufficiency, mild  Abdominal pain, acute, left lower quadrant  GI bleed  Acute colitis  Regional enteritis of small intestine with large intestine  Discharge Condition: stable.  Diet recommendation: bland, soft.  Filed Weights    02/27/14 0506  02/28/14 0335  03/01/14 0450   Weight:  82.101 kg (181 lb)  82.101 kg (181 lb)  82.101 kg (181 lb)   History of present illness:  68 yo female with history of NASH, fibromyalgia and Crohns presents with 2 days of increased diarrhea that was initially bloody. CT showed colitis with possible underlying mass.  Hospital Course:  Acute Distal Ileitis with acute blood loss anemia.  -has hx of Crohn's disease-not on any maintenance medications. Likely Crohn's flare  -Seen by GI, started on IV Solumedrol on 5/24,was on empiric Zosyn-since 5/22-however given worsening thrombocytopenia, she was changed to Rocephin/Flagyl on 5/26. C Diff PCR negative. GI pathogen panel was negative. Antibiotics were discontinued on discharge.  -Hemoglobin is stable and trending up at the time of discharge. No blood transfusion was required.  -Per GI: Progress to prednisone 40 daily until seen in the office by Dr. Olevia Perches. Imodium as needed for diarrhea.  Acute Renal Failure  -likely secondary to diarrhea  -resolved with IVF  -monitor lytes periodically  outpatient, particularly given diuretic therapy.  Liver Cirrhosis  -reviewed prior records-Hx of NASH  -appears stable at present  Pancytopenia  -seecondary to above  -chronic issue-platelets dropping-monitored closely inpatient. Changed Zosyn to Rocephin and Flagyl, stopped protonix 5/26.  -platelets trending up at the time of discharge (30).  DEHYDRATION (VOLUME DEPLETION)  -better-with IVF  DM-Uncontrolled  -uncontrolled likely secondary to steroids  -changed SSI to resistant, and Lantus to 27 units BID. CBGs were still elevated  -She will be prescribed Lantus 40 units qhs and humalog 10 units TID AC at discharge.  -A1C is 10.3  Anxiety/Fibromyalgia  -c/w Wellbutrin, Celexa and Gabapentin  -prn Xanax as an inpatient. Will not discharge on Xanax.  Obesity  -counseled extensively  Consultations:  [facility] Discharge Exam:  Filed Vitals:    03/01/14 0450   BP:  132/77   Pulse:  82   Temp:  98.6 F (37 C)   Resp:  16   Gen Exam: Awake and alert with clear speech. Lying comfortably in bed.  Neck: Supple, No JVD.  Chest: B/L Clear. No increased work of breathing.  CVS: S1 S2 Regular, no murmurs.  Abdomen: soft, BS +, non tender, non distended. No masses  Extremities: mild bilateral edema, lower extremities warm to touch.  Neurologic: Non Focal.  Skin: No Rash.      Epistaxis  The bleeding has been from both nares. The current episode started 1 to 4 weeks ago. The problem occurs daily. The problem has been resolved. The bleeding is associated with nothing. She has tried ice and pressure for the symptoms. Her past medical history is significant for a bleeding disorder and frequent  nosebleeds. There is no history of allergies or sinus problems.     F/u R foot injury:  heater fell on it 1 month ago - not getting any better. C/o pain and swelling of th top of R foot, still bruised. Broken plate - no need for surgery (1/15)  F/u B leg cramps R>L ...   The patient presents  for a follow-up of chronic cirrhosis, hypertension, chronic dyslipidemia, type 2 diabetes controlled with medicines.     Wt Readings from Last 3 Encounters:  03/06/14 179 lb (81.194 kg)  03/01/14 181 lb (82.101 kg)  02/20/14 182 lb (82.555 kg)   BP Readings from Last 3 Encounters:  03/06/14 120/56  03/01/14 132/77  02/20/14 130/80        Review of Systems  Constitutional: Positive for fatigue. Negative for chills, activity change, appetite change and unexpected weight change.  HENT: Positive for nosebleeds. Negative for mouth sores.   Eyes: Negative for visual disturbance.  Respiratory: Negative for chest tightness.   Genitourinary: Negative for frequency, difficulty urinating and vaginal pain.  Musculoskeletal: Positive for arthralgias and gait problem.  Skin: Negative for pallor and rash.  Neurological: Positive for light-headedness. Negative for tremors.  Hematological: Negative for adenopathy. Bruises/bleeds easily.  Psychiatric/Behavioral: Positive for decreased concentration. Negative for suicidal ideas, confusion and sleep disturbance. The patient is nervous/anxious.        Objective:   Physical Exam  Constitutional: She appears well-developed. No distress.  obese  HENT:  Head: Normocephalic.  Right Ear: External ear normal.  Left Ear: External ear normal.  Nose: Nose normal.  Mouth/Throat: Oropharynx is clear and moist.  Eyes: Conjunctivae are normal. Pupils are equal, round, and reactive to light. Right eye exhibits no discharge. Left eye exhibits no discharge.  Neck: Normal range of motion. Neck supple. No JVD present. No tracheal deviation present. No thyromegaly present.  Cardiovascular: Normal rate, regular rhythm and normal heart sounds.   Pulmonary/Chest: No stridor. No respiratory distress. She has no wheezes.  Abdominal: Soft. Bowel sounds are normal. She exhibits no distension and no mass. There is no tenderness. There is no rebound and no guarding.   Musculoskeletal: She exhibits edema (trace L; 1+ R) and tenderness (B knees: R>L calf).  Lymphadenopathy:    She has no cervical adenopathy.  Neurological: She displays normal reflexes. No cranial nerve deficit. She exhibits normal muscle tone. Coordination abnormal.  Skin: No rash noted. No erythema. No pallor.  Psychiatric: She has a normal mood and affect. Her behavior is normal. Judgment and thought content normal.  No dry blood in nares R foot is ok Bruises from IVs  Lab Results  Component Value Date   WBC 4.4 03/01/2014   HGB 10.9* 03/01/2014   HCT 32.2* 03/01/2014   PLT 30* 03/01/2014   GLUCOSE 250* 02/28/2014   CHOL 159 06/13/2009   TRIG 37.0 06/13/2009   HDL 66.50 06/13/2009   LDLCALC 85 06/13/2009   ALT 22 02/25/2014   AST 34 02/25/2014   NA 138 02/28/2014   K 4.2 02/28/2014   CL 105 02/28/2014   CREATININE 0.74 02/28/2014   BUN 11 02/28/2014   CO2 26 02/28/2014   TSH 3.19 10/12/2013   INR 1.47 02/24/2014   HGBA1C 10.3* 02/27/2014     a complex case - multi-organ chronic failure     Assessment & Plan:

## 2014-03-06 NOTE — Progress Notes (Signed)
Pre visit review using our clinic review tool, if applicable. No additional management support is needed unless otherwise documented below in the visit note. 

## 2014-03-07 ENCOUNTER — Other Ambulatory Visit: Payer: Self-pay | Admitting: Internal Medicine

## 2014-03-07 MED ORDER — POTASSIUM CHLORIDE ER 10 MEQ PO TBCR
10.0000 meq | EXTENDED_RELEASE_TABLET | Freq: Two times a day (BID) | ORAL | Status: DC
Start: 2014-03-07 — End: 2014-03-31

## 2014-03-13 ENCOUNTER — Ambulatory Visit (INDEPENDENT_AMBULATORY_CARE_PROVIDER_SITE_OTHER): Payer: Medicare PPO | Admitting: Internal Medicine

## 2014-03-13 ENCOUNTER — Encounter: Payer: Self-pay | Admitting: Internal Medicine

## 2014-03-13 VITALS — BP 130/70 | HR 66 | Ht 62.0 in | Wt 190.0 lb

## 2014-03-13 DIAGNOSIS — K5010 Crohn's disease of large intestine without complications: Secondary | ICD-10-CM

## 2014-03-13 DIAGNOSIS — R197 Diarrhea, unspecified: Secondary | ICD-10-CM

## 2014-03-13 DIAGNOSIS — K7581 Nonalcoholic steatohepatitis (NASH): Secondary | ICD-10-CM

## 2014-03-13 DIAGNOSIS — K7689 Other specified diseases of liver: Secondary | ICD-10-CM

## 2014-03-13 DIAGNOSIS — K625 Hemorrhage of anus and rectum: Secondary | ICD-10-CM

## 2014-03-13 MED ORDER — HYDROCODONE-ACETAMINOPHEN 5-325 MG PO TABS: 1.0000 | ORAL_TABLET | Freq: Three times a day (TID) | ORAL | Status: DC | PRN

## 2014-03-13 MED ORDER — MOVIPREP 100 G PO SOLR: 1.0000 | Freq: Once | ORAL | Status: DC

## 2014-03-13 NOTE — Patient Instructions (Addendum)
You have been scheduled for a colonoscopy with propofol. Please follow written instructions given to you at your visit today.  Please pick up your prep kit at the pharmacy within the next 1-3 days. If you use inhalers (even only as needed), please bring them with you on the day of your procedure. Your physician has requested that you go to www.startemmi.com and enter the access code given to you at your visit today. This web site gives a general overview about your procedure. However, you should still follow specific instructions given to you by our office regarding your preparation for the procedure.  We have sent the following medications to your pharmacy for you to pick up at your convenience: Prednisone 20 mg daily  We have given you a prescription for hydrocodone to take to the pharmacy.  Please increase demadex to 20 mg twice daily.  CC: Dr Cristie Hem Plotnikov

## 2014-03-13 NOTE — Progress Notes (Signed)
[name] [birth year] [id number]  Note: This dictation was prepared with Dragon digital system. Any transcriptional errors that result from this procedure are unintentional.   History of Present Illness:  This is a 68 year old white female with Crohn's disease of at least 20 years duration status post terminal ileal  resection. She was hospitalized for bloody diarrhea from 02/23/14-03/01/2014. She has a history of NASH with cirrhosis. We saw her last time in the office in February 2010 when she had a colonoscopy. She had a normal terminal ileum and ileo-colic anastomosis and no evidence of active disease in the colon. A CT scan of the abdomen in May 2015 showed active colitis. She was treated with intravenous steroids and discharged on prednisone 40 mg daily but she discontinue the Prednisone  because it did make her feel bad. She has developed massive swelling of her feet and weight gain up to 190 pounds. She continues to have diarrhea and blood per rectum.    Past Medical History  Diagnosis Date  . Personal history of peptic ulcer disease   . Ventral hernia, unspecified, without mention of obstruction or gangrene   . Degeneration of cervical intervertebral disc   . Crohn's disease   . Unspecified constipation   . Unspecified intestinal obstruction   . Cirrhosis   . Type II or unspecified type diabetes mellitus without mention of complication, not stated as uncontrolled   . Unspecified essential hypertension   . Subarachnoid hemorrhage   . Depressive disorder, not elsewhere classified   . Iron deficiency anemia, unspecified   . Lumbago   . Unspecified hearing loss   . Urinary tract infection, site not specified   . Morbid obesity   . Personal history of urinary calculi   . Family history of diabetes mellitus   . Anemia   . Fibromyalgia   . Portal venous hypertension   . Splenomegaly   . History of pancreatitis   . Suicide attempt 04/2000    Past Surgical History  Procedure  Laterality Date  . Cholecystectomy    . Abdominal hysterectomy    . Partial colectomy    . Breast cyst excision    . Orif tibia & fibula fractures Right   . Liver biopsy      Allergies  Allergen Reactions  . Amoxicillin-Pot Clavulanate Diarrhea    REACTION: diarrhea  . Ciprofloxacin Other (See Comments)    REACTION: swelling of LE  . Duragesic Disc Transdermal System [Alcohol-Fentanyl] Itching    Itching from patch  . Metformin Diarrhea  . Sulfamethoxazole-Trimethoprim Swelling    REACTION: leg edema  . Tetracycline Hcl Itching  . Tramadol Other (See Comments)    Headache  . Azithromycin Rash    Family history and social history have been reviewed.  Review of Systems:   The remainder of the 10 point ROS is negative except as outlined in the H&P  Physical Exam: General Appearance Well developed, in no distress chronically ill-appearing Eyes  Non icteric  HEENT  Non traumatic, normocephalic  Mouth No lesion, tongue papillated, no cheilosis, [name]'s Neck Supple without adenopathy, thyroid not enlarged, no carotid bruits, no JVD Lungs Clear to auscultation bilaterally COR Normal S1, normal S2, regular rhythm, no murmur, quiet precordium Abdomen soft diffusely tender with normoactive bowel sounds. No distention. No ascites Rectal not done Extremities 4+ edema from ankles up to her thighs with weeping and blistering Skin No lesions Neurological Alert and oriented x 3, no asterixis Psychological Normal mood and affect  Assessment  and Plan:   Problem #1 Crohn's colitis. Patient discontinued medications after discharge and has had a recurrence of diarrhea and bleeding. She is due for a colonoscopy to assess the etiology and the extent of the disease. I'm putting her back on prednisone 20 mg daily because she refused to take 40 mg. We will also increase her diuretics to Aldactone 100 mg daily and Demadex 40 mg daily. I asked her to limit her salt intake and elevate her  feet every day.      Lafayette Dragon 03/13/2014

## 2014-03-19 ENCOUNTER — Ambulatory Visit (HOSPITAL_COMMUNITY): Admission: RE | Admit: 2014-03-19 | Payer: Medicare PPO | Source: Ambulatory Visit | Admitting: Internal Medicine

## 2014-03-19 ENCOUNTER — Encounter (HOSPITAL_COMMUNITY): Admission: RE | Payer: Self-pay | Source: Ambulatory Visit

## 2014-03-19 SURGERY — COLONOSCOPY
Anesthesia: Moderate Sedation

## 2014-03-19 NOTE — H&P (View-Only) (Signed)
[name] 05/21/1946 [id number]  Note: This dictation was prepared with Dragon digital system. Any transcriptional errors that result from this procedure are unintentional.   History of Present Illness:  This is a 68 year old white female with Crohn's disease of at least 20 years duration status post terminal ileal  resection. She was hospitalized for bloody diarrhea from 02/23/14-03/01/2014. She has a history of NASH with cirrhosis. We saw her last time in the office in February 2010 when she had a colonoscopy. She had a normal terminal ileum and ileo-colic anastomosis and no evidence of active disease in the colon. A CT scan of the abdomen in May 2015 showed active colitis. She was treated with intravenous steroids and discharged on prednisone 40 mg daily but she discontinue the Prednisone  because it did make her feel bad. She has developed massive swelling of her feet and weight gain up to 190 pounds. She continues to have diarrhea and blood per rectum.    Past Medical History  Diagnosis Date  . Personal history of peptic ulcer disease   . Ventral hernia, unspecified, without mention of obstruction or gangrene   . Degeneration of cervical intervertebral disc   . Crohn's disease   . Unspecified constipation   . Unspecified intestinal obstruction   . Cirrhosis   . Type II or unspecified type diabetes mellitus without mention of complication, not stated as uncontrolled   . Unspecified essential hypertension   . Subarachnoid hemorrhage   . Depressive disorder, not elsewhere classified   . Iron deficiency anemia, unspecified   . Lumbago   . Unspecified hearing loss   . Urinary tract infection, site not specified   . Morbid obesity   . Personal history of urinary calculi   . Family history of diabetes mellitus   . Anemia   . Fibromyalgia   . Portal venous hypertension   . Splenomegaly   . History of pancreatitis   . Suicide attempt 04/2000    Past Surgical History  Procedure  Laterality Date  . Cholecystectomy    . Abdominal hysterectomy    . Partial colectomy    . Breast cyst excision    . Orif tibia & fibula fractures Right   . Liver biopsy      Allergies  Allergen Reactions  . Amoxicillin-Pot Clavulanate Diarrhea    REACTION: diarrhea  . Ciprofloxacin Other (See Comments)    REACTION: swelling of LE  . Duragesic Disc Transdermal System [Alcohol-Fentanyl] Itching    Itching from patch  . Metformin Diarrhea  . Sulfamethoxazole-Trimethoprim Swelling    REACTION: leg edema  . Tetracycline Hcl Itching  . Tramadol Other (See Comments)    Headache  . Azithromycin Rash    Family history and social history have been reviewed.  Review of Systems:   The remainder of the 10 point ROS is negative except as outlined in the H&P  Physical Exam: General Appearance Well developed, in no distress chronically ill-appearing Eyes  Non icteric  HEENT  Non traumatic, normocephalic  Mouth No lesion, tongue papillated, no cheilosis, [name]'s Neck Supple without adenopathy, thyroid not enlarged, no carotid bruits, no JVD Lungs Clear to auscultation bilaterally COR Normal S1, normal S2, regular rhythm, no murmur, quiet precordium Abdomen soft diffusely tender with normoactive bowel sounds. No distention. No ascites Rectal not done Extremities 4+ edema from ankles up to her thighs with weeping and blistering Skin No lesions Neurological Alert and oriented x 3, no asterixis Psychological Normal mood and affect  Assessment  and Plan:   Problem #1 Crohn's colitis. Patient discontinued medications after discharge and has had a recurrence of diarrhea and bleeding. She is due for a colonoscopy to assess the etiology and the extent of the disease. I'm putting her back on prednisone 20 mg daily because she refused to take 40 mg. We will also increase her diuretics to Aldactone 100 mg daily and Demadex 40 mg daily. I asked her to limit her salt intake and elevate her  feet every day.      Lafayette Dragon 03/13/2014

## 2014-03-19 NOTE — Interval H&P Note (Signed)
History and Physical Interval Note:  03/19/2014 8:31 AM  [name]  has presented today for surgery, with the diagnosis of bloody diarrhea crohns disease  The various methods of treatment have been discussed with the patient and family. After consideration of risks, benefits and other options for treatment, the patient has consented to  Procedure(s): COLONOSCOPY (N/A) as a surgical intervention .  The patient's history has been reviewed, patient examined, no change in status, stable for surgery.  I have reviewed the patient's chart and labs.  Questions were answered to the patient's satisfaction.     Delfin Edis

## 2014-03-20 ENCOUNTER — Telehealth: Payer: Self-pay | Admitting: Internal Medicine

## 2014-03-20 NOTE — Telephone Encounter (Signed)
Unable to reach patient. Rings but no answer.

## 2014-03-20 NOTE — Telephone Encounter (Signed)
Spoke with patient and she is calling because she had diarrhea all night and her stomach hurts. Patient states she cancelled her colonoscopy that was scheduled yesterday because she fell last Wednesday and hit her head. She states her head hurt and she was afraid to have the procedure. (she did not seek medical care after the fall)  Please, advise.

## 2014-03-20 NOTE — Telephone Encounter (Signed)
She did not cancel, she just did not show up. The fall was last week. OK to reschedule but will have to wait till I am in the hsopital. Take Imodium 1 po tid, and prednisone as prescribed 20 mg po qd.

## 2014-03-20 NOTE — Telephone Encounter (Signed)
Spoke with patient and gave her recommendations. She will try Imodium. Will try to get her scheduled on next hospital week.

## 2014-03-21 ENCOUNTER — Encounter: Payer: Self-pay | Admitting: *Deleted

## 2014-03-21 ENCOUNTER — Other Ambulatory Visit: Payer: Self-pay | Admitting: *Deleted

## 2014-03-21 ENCOUNTER — Telehealth: Payer: Self-pay | Admitting: *Deleted

## 2014-03-21 DIAGNOSIS — K5010 Crohn's disease of large intestine without complications: Secondary | ICD-10-CM

## 2014-03-21 NOTE — Telephone Encounter (Signed)
Spoke with Coralyn Mark at [facility] endo and scheduled patient on 05/14/14 at 10:30 AM for prop colonoscopy.(case [id number])

## 2014-03-21 NOTE — Telephone Encounter (Signed)
Per Dr. Olevia Perches, try to get patient to schedule for procedure on 03/28/14. Unable to reach patient. Will try again later.

## 2014-03-21 NOTE — Telephone Encounter (Signed)
Spoke with patient and scheduled her on 03/28/14 at 2:00 PM [facility] for colonoscopy. Pre visit on 03/22/14 at 8:30 AM.

## 2014-03-21 NOTE — Telephone Encounter (Signed)
Spoke with patient and gave her date and time of procedure. Mailed patient new instructions for procedure. She reports she is still having diarrhea. She had not taken an Imodium. She will do this.

## 2014-03-22 ENCOUNTER — Ambulatory Visit (AMBULATORY_SURGERY_CENTER): Payer: Self-pay | Admitting: *Deleted

## 2014-03-22 VITALS — Ht 63.0 in | Wt 189.6 lb

## 2014-03-22 DIAGNOSIS — R197 Diarrhea, unspecified: Secondary | ICD-10-CM

## 2014-03-22 NOTE — Progress Notes (Signed)
No allergies to eggs or soy. No problems with anesthesia.  Pt not given Emmi instructions for colonoscopy; no computer access  No oxygen use  No diet drug use  

## 2014-03-28 ENCOUNTER — Encounter: Payer: Self-pay | Admitting: Internal Medicine

## 2014-03-28 ENCOUNTER — Encounter: Payer: Self-pay | Admitting: *Deleted

## 2014-03-28 ENCOUNTER — Ambulatory Visit (AMBULATORY_SURGERY_CENTER): Payer: Medicare PPO | Admitting: Internal Medicine

## 2014-03-28 VITALS — BP 143/81 | HR 81 | Temp 97.3°F | Resp 12 | Ht 71.0 in | Wt 227.0 lb

## 2014-03-28 DIAGNOSIS — R52 Pain, unspecified: Secondary | ICD-10-CM

## 2014-03-28 DIAGNOSIS — D1330 Benign neoplasm of unspecified part of small intestine: Secondary | ICD-10-CM

## 2014-03-28 DIAGNOSIS — D126 Benign neoplasm of colon, unspecified: Secondary | ICD-10-CM

## 2014-03-28 DIAGNOSIS — K5010 Crohn's disease of large intestine without complications: Secondary | ICD-10-CM

## 2014-03-28 DIAGNOSIS — R1032 Left lower quadrant pain: Secondary | ICD-10-CM

## 2014-03-28 DIAGNOSIS — R933 Abnormal findings on diagnostic imaging of other parts of digestive tract: Secondary | ICD-10-CM

## 2014-03-28 DIAGNOSIS — K5289 Other specified noninfective gastroenteritis and colitis: Secondary | ICD-10-CM

## 2014-03-28 LAB — GLUCOSE, CAPILLARY
GLUCOSE-CAPILLARY: 305 mg/dL — AB (ref 70–99)
Glucose-Capillary: 315 mg/dL — ABNORMAL HIGH (ref 70–99)
Glucose-Capillary: 327 mg/dL — ABNORMAL HIGH (ref 70–99)

## 2014-03-28 MED ORDER — INSULIN ASPART 100 UNIT/ML ~~LOC~~ SOLN: 5.0000 [IU] | Freq: Once | SUBCUTANEOUS | Status: DC

## 2014-03-28 MED ORDER — SODIUM CHLORIDE 0.9 % IV SOLN: 500.0000 mL | INTRAVENOUS | Status: DC

## 2014-03-28 MED ORDER — INSULIN REGULAR HUMAN 100 UNIT/ML IJ SOLN: 5.0000 [IU] | Freq: Once | INTRAMUSCULAR | Status: DC

## 2014-03-28 NOTE — Op Note (Signed)
[facility]  [address] [place], [id number]   COLONOSCOPY PROCEDURE REPORT  PATIENT: [name], [name]  MR#: [id number] BIRTHDATE: 01/29/1946 , 68  yrs. old GENDER: Female ENDOSCOPIST: Lafayette Dragon, MD REFERRED MO:QHUT Avel Sensor, M.D. PROCEDURE DATE:  03/28/2014 PROCEDURE:   Colonoscopy with biopsy First Screening Colonoscopy - Avg.  risk and is 50 yrs.  old or older - No.  Prior Negative Screening - Now for repeat screening. N/A  History of Adenoma - Now for follow-up colonoscopy & has been > or = to 3 yrs.  N/A  Polyps Removed Today? No.  Recommend repeat exam, <10 yrs? Yes.  High risk (family or personal hx). ASA CLASS:   Class III INDICATIONS:Crohn's colitis and ileitis x20 years.  Status post terminal ileal resection.  Chronic diarrhea.  Recent hospitalization for rectal bleeding and acute colitis and spurs CT scan of the abdomen consistent with colitis. MEDICATIONS: MAC sedation, administered by CRNA and propofol (Diprivan) 200mg  IV  DESCRIPTION OF PROCEDURE:   After the risks benefits and alternatives of the procedure were thoroughly explained, informed consent was obtained.  A digital rectal exam revealed no abnormalities of the rectum.   The LB [id number]  endoscope was introduced through the anus and advanced to the terminal ileum which was intubated for a short distance. No adverse events experienced.   The quality of the prep was good, using MoviPrep The instrument was then slowly withdrawn as the colon was fully examined.      COLON FINDINGS: There was evidence of a prior end-to-end ileocolonic surgical anastomosis. mucosa of the sigmoid colon, descending colon, transverse colon, descending colon was partly normal. Ileocolic anastomosis was widely patent. There were no ulcerations. Terminal ileum was examined and showed normal-appearing mucosa. Biopsies were obtained from distal ileum, descending colon and sigmoid colon there were small  internal hemorrhoids Retroflexed views revealed no abnormalities. The time to cecum=8 minutes 19 seconds.  Withdrawal time=927 minutes 0 seconds.  The scope was withdrawn and the procedure completed. COMPLICATIONS: There were no complications.  ENDOSCOPIC IMPRESSION: There was evidence of a prior ileocolonic surgical anastomosis ,which was widely patent Normal appearing distal ileum status post biopsies Normal appearing colon status post biopsies from descending and sigmoid colon Small internal hemorrhoids  RECOMMENDATIONS: 1.  Await biopsy results 2.  continue to taper steroids. Continue all medications. Office visit in 8 weeks recall colonoscopy in 5 years  eSigned:  Lafayette Dragon, MD 03/28/2014 2:46 PM   cc:   PATIENT NAME:  [name], [name] MR#: [id number]

## 2014-03-28 NOTE — Progress Notes (Signed)
A/ox3 pleased with MAC, report to Karen RN 

## 2014-03-28 NOTE — Patient Instructions (Signed)
YOU HAD AN ENDOSCOPIC PROCEDURE TODAY AT THE [facility]: Refer to the procedure report that was given to you for any specific questions about what was found during the examination.  If the procedure report does not answer your questions, please call your gastroenterologist to clarify.  If you requested that your care partner not be given the details of your procedure findings, then the procedure report has been included in a sealed envelope for you to review at your convenience later.  YOU SHOULD EXPECT: Some feelings of bloating in the abdomen. Passage of more gas than usual.  Walking can help get rid of the air that was put into your GI tract during the procedure and reduce the bloating. If you had a lower endoscopy (such as a colonoscopy or flexible sigmoidoscopy) you may notice spotting of blood in your stool or on the toilet paper. If you underwent a bowel prep for your procedure, then you may not have a normal bowel movement for a few days.  DIET: Your first meal following the procedure should be a light meal and then it is ok to progress to your normal diet.  A half-sandwich or bowl of soup is an example of a good first meal.  Heavy or fried foods are harder to digest and may make you feel nauseous or bloated.  Likewise meals heavy in dairy and vegetables can cause extra gas to form and this can also increase the bloating.  Drink plenty of fluids but you should avoid alcoholic beverages for 24 hours.  ACTIVITY: Your care partner should take you home directly after the procedure.  You should plan to take it easy, moving slowly for the rest of the day.  You can resume normal activity the day after the procedure however you should NOT DRIVE or use heavy machinery for 24 hours (because of the sedation medicines used during the test).    SYMPTOMS TO REPORT IMMEDIATELY: A gastroenterologist can be reached at any hour.  During normal business hours, 8:30 AM to 5:00 PM Monday through Friday,  call [phone].  After hours and on weekends, please call the GI answering service at [phone] who will take a message and have the physician on call contact you.   Following lower endoscopy (colonoscopy or flexible sigmoidoscopy):  Excessive amounts of blood in the stool  Significant tenderness or worsening of abdominal pains  Swelling of the abdomen that is new, acute  Fever of 100F or higher    FOLLOW UP: If any biopsies were taken you will be contacted by phone or by letter within the next 1-3 weeks.  Call your gastroenterologist if you have not heard about the biopsies in 3 weeks.  Our staff will call the home number listed on your records the next business day following your procedure to check on you and address any questions or concerns that you may have at that time regarding the information given to you following your procedure. This is a courtesy call and so if there is no answer at the home number and we have not heard from you through the emergency physician on call, we will assume that you have returned to your regular daily activities without incident.  SIGNATURES/CONFIDENTIALITY: You and/or your care partner have signed paperwork which will be entered into your electronic medical record.  These signatures attest to the fact that that the information above on your After Visit Summary has been reviewed and is understood.  Full responsibility of the confidentiality   of this discharge information lies with you and/or your care-partner.     

## 2014-03-28 NOTE — Progress Notes (Signed)
1406 Repeat accucheck 327, Dr. Olevia Perches consulted, Reg Humulin Insulin 5 units IV ordered and given

## 2014-03-28 NOTE — Progress Notes (Signed)
Dr. Olevia Perches notified of blood sugar. State to give 5 units of Insulin SQ now.

## 2014-03-28 NOTE — Progress Notes (Signed)
Patient had a small amount of pink tinged drainage on bed pad. Instructed patient to call if she has passage of clots or bleeding. Patient verbalized understanding.

## 2014-03-28 NOTE — Progress Notes (Signed)
Called to room to assist during endoscopic procedure.  Patient ID and intended procedure confirmed with present staff. Received instructions for my participation in the procedure from the performing physician.  

## 2014-03-29 ENCOUNTER — Encounter: Payer: Self-pay | Admitting: *Deleted

## 2014-03-29 ENCOUNTER — Telehealth: Payer: Self-pay | Admitting: *Deleted

## 2014-03-29 NOTE — Telephone Encounter (Signed)
  Follow up Call-  No answer at number provided

## 2014-03-30 ENCOUNTER — Inpatient Hospital Stay (HOSPITAL_COMMUNITY)
Admission: EM | Admit: 2014-03-30 | Discharge: 2014-04-02 | DRG: 690 | Disposition: A | Discharge status: Home / Self Care | Payer: Medicare PPO | Attending: Internal Medicine | Admitting: Internal Medicine

## 2014-03-30 ENCOUNTER — Emergency Department (HOSPITAL_COMMUNITY): Payer: Medicare PPO

## 2014-03-30 ENCOUNTER — Encounter (HOSPITAL_COMMUNITY): Payer: Self-pay | Admitting: Emergency Medicine

## 2014-03-30 DIAGNOSIS — R1031 Right lower quadrant pain: Secondary | ICD-10-CM

## 2014-03-30 DIAGNOSIS — R21 Rash and other nonspecific skin eruption: Secondary | ICD-10-CM

## 2014-03-30 DIAGNOSIS — E1165 Type 2 diabetes mellitus with hyperglycemia: Secondary | ICD-10-CM

## 2014-03-30 DIAGNOSIS — E162 Hypoglycemia, unspecified: Secondary | ICD-10-CM

## 2014-03-30 DIAGNOSIS — D696 Thrombocytopenia, unspecified: Secondary | ICD-10-CM | POA: Diagnosis present

## 2014-03-30 DIAGNOSIS — N289 Disorder of kidney and ureter, unspecified: Secondary | ICD-10-CM

## 2014-03-30 DIAGNOSIS — L84 Corns and callosities: Secondary | ICD-10-CM

## 2014-03-30 DIAGNOSIS — Z8719 Personal history of other diseases of the digestive system: Secondary | ICD-10-CM

## 2014-03-30 DIAGNOSIS — M7989 Other specified soft tissue disorders: Secondary | ICD-10-CM

## 2014-03-30 DIAGNOSIS — R1011 Right upper quadrant pain: Secondary | ICD-10-CM

## 2014-03-30 DIAGNOSIS — M79606 Pain in leg, unspecified: Secondary | ICD-10-CM

## 2014-03-30 DIAGNOSIS — F329 Major depressive disorder, single episode, unspecified: Secondary | ICD-10-CM

## 2014-03-30 DIAGNOSIS — B3781 Candidal esophagitis: Secondary | ICD-10-CM

## 2014-03-30 DIAGNOSIS — M79672 Pain in left foot: Secondary | ICD-10-CM

## 2014-03-30 DIAGNOSIS — L03119 Cellulitis of unspecified part of limb: Secondary | ICD-10-CM

## 2014-03-30 DIAGNOSIS — E860 Dehydration: Secondary | ICD-10-CM

## 2014-03-30 DIAGNOSIS — L02415 Cutaneous abscess of right lower limb: Secondary | ICD-10-CM

## 2014-03-30 DIAGNOSIS — B370 Candidal stomatitis: Secondary | ICD-10-CM

## 2014-03-30 DIAGNOSIS — J069 Acute upper respiratory infection, unspecified: Secondary | ICD-10-CM

## 2014-03-30 DIAGNOSIS — K766 Portal hypertension: Secondary | ICD-10-CM | POA: Diagnosis present

## 2014-03-30 DIAGNOSIS — R739 Hyperglycemia, unspecified: Secondary | ICD-10-CM

## 2014-03-30 DIAGNOSIS — Z0000 Encounter for general adult medical examination without abnormal findings: Secondary | ICD-10-CM

## 2014-03-30 DIAGNOSIS — K529 Noninfective gastroenteritis and colitis, unspecified: Secondary | ICD-10-CM

## 2014-03-30 DIAGNOSIS — Z8711 Personal history of peptic ulcer disease: Secondary | ICD-10-CM

## 2014-03-30 DIAGNOSIS — Z6832 Body mass index (BMI) 32.0-32.9, adult: Secondary | ICD-10-CM

## 2014-03-30 DIAGNOSIS — R1032 Left lower quadrant pain: Secondary | ICD-10-CM

## 2014-03-30 DIAGNOSIS — Z87891 Personal history of nicotine dependence: Secondary | ICD-10-CM

## 2014-03-30 DIAGNOSIS — R197 Diarrhea, unspecified: Secondary | ICD-10-CM

## 2014-03-30 DIAGNOSIS — S0990XA Unspecified injury of head, initial encounter: Secondary | ICD-10-CM

## 2014-03-30 DIAGNOSIS — H9190 Unspecified hearing loss, unspecified ear: Secondary | ICD-10-CM

## 2014-03-30 DIAGNOSIS — E119 Type 2 diabetes mellitus without complications: Secondary | ICD-10-CM

## 2014-03-30 DIAGNOSIS — T148XXA Other injury of unspecified body region, initial encounter: Secondary | ICD-10-CM

## 2014-03-30 DIAGNOSIS — K439 Ventral hernia without obstruction or gangrene: Secondary | ICD-10-CM

## 2014-03-30 DIAGNOSIS — R319 Hematuria, unspecified: Secondary | ICD-10-CM

## 2014-03-30 DIAGNOSIS — R609 Edema, unspecified: Secondary | ICD-10-CM

## 2014-03-30 DIAGNOSIS — I609 Nontraumatic subarachnoid hemorrhage, unspecified: Secondary | ICD-10-CM

## 2014-03-30 DIAGNOSIS — K5900 Constipation, unspecified: Secondary | ICD-10-CM

## 2014-03-30 DIAGNOSIS — L0201 Cutaneous abscess of face: Secondary | ICD-10-CM

## 2014-03-30 DIAGNOSIS — IMO0001 Reserved for inherently not codable concepts without codable children: Secondary | ICD-10-CM | POA: Diagnosis present

## 2014-03-30 DIAGNOSIS — L03211 Cellulitis of face: Secondary | ICD-10-CM

## 2014-03-30 DIAGNOSIS — L719 Rosacea, unspecified: Secondary | ICD-10-CM

## 2014-03-30 DIAGNOSIS — R0789 Other chest pain: Secondary | ICD-10-CM

## 2014-03-30 DIAGNOSIS — M542 Cervicalgia: Secondary | ICD-10-CM

## 2014-03-30 DIAGNOSIS — R51 Headache: Secondary | ICD-10-CM

## 2014-03-30 DIAGNOSIS — D509 Iron deficiency anemia, unspecified: Secondary | ICD-10-CM

## 2014-03-30 DIAGNOSIS — R1012 Left upper quadrant pain: Secondary | ICD-10-CM

## 2014-03-30 DIAGNOSIS — K56609 Unspecified intestinal obstruction, unspecified as to partial versus complete obstruction: Secondary | ICD-10-CM

## 2014-03-30 DIAGNOSIS — K5010 Crohn's disease of large intestine without complications: Secondary | ICD-10-CM | POA: Diagnosis present

## 2014-03-30 DIAGNOSIS — L02419 Cutaneous abscess of limb, unspecified: Secondary | ICD-10-CM

## 2014-03-30 DIAGNOSIS — Z87442 Personal history of urinary calculi: Secondary | ICD-10-CM

## 2014-03-30 DIAGNOSIS — K7460 Unspecified cirrhosis of liver: Secondary | ICD-10-CM | POA: Diagnosis present

## 2014-03-30 DIAGNOSIS — I959 Hypotension, unspecified: Secondary | ICD-10-CM | POA: Diagnosis present

## 2014-03-30 DIAGNOSIS — N390 Urinary tract infection, site not specified: Principal | ICD-10-CM | POA: Diagnosis present

## 2014-03-30 DIAGNOSIS — Z79899 Other long term (current) drug therapy: Secondary | ICD-10-CM

## 2014-03-30 DIAGNOSIS — M79671 Pain in right foot: Secondary | ICD-10-CM

## 2014-03-30 DIAGNOSIS — E876 Hypokalemia: Secondary | ICD-10-CM | POA: Diagnosis present

## 2014-03-30 DIAGNOSIS — F3289 Other specified depressive episodes: Secondary | ICD-10-CM | POA: Diagnosis present

## 2014-03-30 DIAGNOSIS — Z794 Long term (current) use of insulin: Secondary | ICD-10-CM

## 2014-03-30 DIAGNOSIS — Z9181 History of falling: Secondary | ICD-10-CM

## 2014-03-30 DIAGNOSIS — N3289 Other specified disorders of bladder: Secondary | ICD-10-CM

## 2014-03-30 DIAGNOSIS — M5030 Other cervical disc degeneration, unspecified cervical region: Secondary | ICD-10-CM

## 2014-03-30 DIAGNOSIS — I10 Essential (primary) hypertension: Secondary | ICD-10-CM | POA: Diagnosis present

## 2014-03-30 DIAGNOSIS — M797 Fibromyalgia: Secondary | ICD-10-CM

## 2014-03-30 DIAGNOSIS — R7309 Other abnormal glucose: Secondary | ICD-10-CM

## 2014-03-30 DIAGNOSIS — R040 Epistaxis: Secondary | ICD-10-CM

## 2014-03-30 LAB — BASIC METABOLIC PANEL
BUN: 8 mg/dL (ref 6–23)
BUN: 10 mg/dL (ref 6–23)
CALCIUM: 8.1 mg/dL — AB (ref 8.4–10.5)
CO2: 23 mEq/L (ref 19–32)
CO2: 23 mEq/L (ref 19–32)
Calcium: 8.7 mg/dL (ref 8.4–10.5)
Chloride: 100 mEq/L (ref 96–112)
Chloride: 103 mEq/L (ref 96–112)
Creatinine, Ser: 0.79 mg/dL (ref 0.50–1.10)
Creatinine, Ser: 0.82 mg/dL (ref 0.50–1.10)
GFR calc Af Amer: 85 mL/min — ABNORMAL LOW (ref >90)
GFR calc non Af Amer: 85 mL/min — ABNORMAL LOW (ref >90)
GFR, EST NON AFRICAN AMERICAN: 73 mL/min — AB (ref >90)
GLUCOSE: 266 mg/dL — AB (ref 70–99)
Glucose, Bld: 200 mg/dL — ABNORMAL HIGH (ref 70–99)
POTASSIUM: 2.3 meq/L — AB (ref 3.7–5.3)
Potassium: 3 mEq/L — ABNORMAL LOW (ref 3.7–5.3)
SODIUM: 137 meq/L (ref 137–147)
Sodium: 139 mEq/L (ref 137–147)

## 2014-03-30 LAB — URINALYSIS, ROUTINE W REFLEX MICROSCOPIC
KETONES UR: NEGATIVE mg/dL
Nitrite: NEGATIVE
PH: 6 (ref 5.0–8.0)
Protein, ur: 100 mg/dL — AB
Specific Gravity, Urine: 1.023 (ref 1.005–1.030)
Urobilinogen, UA: 4 mg/dL — ABNORMAL HIGH (ref 0.0–1.0)

## 2014-03-30 LAB — CBC
HCT: 30.5 % — ABNORMAL LOW (ref 36.0–46.0)
Hemoglobin: 10.7 g/dL — ABNORMAL LOW (ref 12.0–15.0)
MCH: 31.6 pg (ref 26.0–34.0)
MCHC: 35.1 g/dL (ref 30.0–36.0)
MCV: 90 fL (ref 78.0–100.0)
Platelets: 20 10*3/uL — CL (ref 150–400)
RBC: 3.39 MIL/uL — ABNORMAL LOW (ref 3.87–5.11)
RDW: 17.4 % — AB (ref 11.5–15.5)
WBC: 6.5 10*3/uL (ref 4.0–10.5)

## 2014-03-30 LAB — URINE MICROSCOPIC-ADD ON

## 2014-03-30 LAB — MRSA PCR SCREENING: MRSA by PCR: NEGATIVE

## 2014-03-30 LAB — MAGNESIUM: Magnesium: 1.4 mg/dL — ABNORMAL LOW (ref 1.5–2.5)

## 2014-03-30 LAB — GLUCOSE, CAPILLARY: Glucose-Capillary: 262 mg/dL — ABNORMAL HIGH (ref 70–99)

## 2014-03-30 LAB — LACTIC ACID, PLASMA: Lactic Acid, Venous: 2 mmol/L (ref 0.5–2.2)

## 2014-03-30 LAB — TSH: TSH: 0.564 u[IU]/mL (ref 0.350–4.500)

## 2014-03-30 MED ORDER — SODIUM CHLORIDE 0.9 % IV BOLUS (SEPSIS)
500.0000 mL | Freq: Once | INTRAVENOUS | Status: AC
  Administered 2014-03-30: 500 mL via INTRAVENOUS

## 2014-03-30 MED ORDER — SODIUM CHLORIDE 0.9 % IV SOLN: INTRAVENOUS | Status: DC

## 2014-03-30 MED ORDER — ONDANSETRON HCL 4 MG PO TABS
4.0000 mg | ORAL_TABLET | Freq: Four times a day (QID) | ORAL | Status: DC | PRN
  Administered 2014-04-02: 4 mg via ORAL
  Filled 2014-03-30: qty 1

## 2014-03-30 MED ORDER — INSULIN LISPRO 100 UNIT/ML (KWIKPEN): 10.0000 [IU] | PEN_INJECTOR | Freq: Three times a day (TID) | SUBCUTANEOUS | Status: DC

## 2014-03-30 MED ORDER — POTASSIUM CHLORIDE ER 10 MEQ PO TBCR
10.0000 meq | EXTENDED_RELEASE_TABLET | Freq: Two times a day (BID) | ORAL | Status: DC
  Administered 2014-03-30 – 2014-04-01 (×5): 10 meq via ORAL
  Filled 2014-03-30 (×6): qty 1

## 2014-03-30 MED ORDER — BUPROPION HCL ER (SR) 150 MG PO TB12
150.0000 mg | ORAL_TABLET | Freq: Every day | ORAL | Status: DC
  Administered 2014-03-31 – 2014-04-02 (×3): 150 mg via ORAL
  Filled 2014-03-30 (×3): qty 1

## 2014-03-30 MED ORDER — DEXTROSE 5 % IV SOLN
1.0000 g | INTRAVENOUS | Status: DC
  Administered 2014-03-30 – 2014-03-31 (×2): 1 g via INTRAVENOUS
  Filled 2014-03-30 (×5): qty 10

## 2014-03-30 MED ORDER — DEXTROSE 5 % IV SOLN
1.0000 g | Freq: Once | INTRAVENOUS | Status: AC
  Administered 2014-03-30: 1 g via INTRAVENOUS
  Filled 2014-03-30: qty 10

## 2014-03-30 MED ORDER — INSULIN ASPART 100 UNIT/ML ~~LOC~~ SOLN
0.0000 [IU] | Freq: Three times a day (TID) | SUBCUTANEOUS | Status: DC
  Administered 2014-03-30: 5 [IU] via SUBCUTANEOUS
  Administered 2014-03-31 (×2): 2 [IU] via SUBCUTANEOUS
  Administered 2014-03-31: 8 [IU] via SUBCUTANEOUS
  Administered 2014-04-01 – 2014-04-02 (×3): 3 [IU] via SUBCUTANEOUS

## 2014-03-30 MED ORDER — PREDNISONE 20 MG PO TABS
40.0000 mg | ORAL_TABLET | Freq: Every day | ORAL | Status: DC
Start: 2014-03-31 — End: 2014-04-01
  Administered 2014-04-01: 40 mg via ORAL
  Filled 2014-03-30 (×3): qty 2

## 2014-03-30 MED ORDER — CITALOPRAM HYDROBROMIDE 10 MG PO TABS
10.0000 mg | ORAL_TABLET | Freq: Every day | ORAL | Status: DC
  Administered 2014-03-31 – 2014-04-02 (×3): 10 mg via ORAL
  Filled 2014-03-30 (×3): qty 1

## 2014-03-30 MED ORDER — INSULIN ASPART 100 UNIT/ML ~~LOC~~ SOLN
10.0000 [IU] | Freq: Three times a day (TID) | SUBCUTANEOUS | Status: DC
  Administered 2014-03-30 – 2014-04-01 (×6): 10 [IU] via SUBCUTANEOUS

## 2014-03-30 MED ORDER — GABAPENTIN 400 MG PO CAPS
800.0000 mg | ORAL_CAPSULE | Freq: Four times a day (QID) | ORAL | Status: DC | PRN
  Administered 2014-03-30 – 2014-03-31 (×3): 800 mg via ORAL
  Filled 2014-03-30 (×5): qty 2

## 2014-03-30 MED ORDER — SODIUM CHLORIDE 0.9 % IV SOLN
INTRAVENOUS | Status: DC
  Administered 2014-03-31: 02:00:00 via INTRAVENOUS

## 2014-03-30 MED ORDER — SODIUM CHLORIDE 0.9 % IJ SOLN
3.0000 mL | Freq: Two times a day (BID) | INTRAMUSCULAR | Status: DC
  Administered 2014-03-30 – 2014-03-31 (×3): 3 mL via INTRAVENOUS

## 2014-03-30 MED ORDER — ACETAMINOPHEN 325 MG PO TABS
650.0000 mg | ORAL_TABLET | Freq: Once | ORAL | Status: AC
  Administered 2014-03-30: 650 mg via ORAL
  Filled 2014-03-30: qty 2

## 2014-03-30 MED ORDER — GABAPENTIN 800 MG PO TABS: 800.0000 mg | ORAL_TABLET | Freq: Four times a day (QID) | ORAL | Status: DC | PRN

## 2014-03-30 MED ORDER — POTASSIUM CHLORIDE CRYS ER 20 MEQ PO TBCR
40.0000 meq | EXTENDED_RELEASE_TABLET | Freq: Once | ORAL | Status: AC
  Administered 2014-03-30: 40 meq via ORAL
  Filled 2014-03-30: qty 2

## 2014-03-30 MED ORDER — INSULIN ASPART 100 UNIT/ML ~~LOC~~ SOLN
0.0000 [IU] | Freq: Every day | SUBCUTANEOUS | Status: DC
  Administered 2014-03-30: 3 [IU] via SUBCUTANEOUS

## 2014-03-30 MED ORDER — ACETAMINOPHEN 650 MG RE SUPP: 650.0000 mg | Freq: Four times a day (QID) | RECTAL | Status: DC | PRN

## 2014-03-30 MED ORDER — ACETAMINOPHEN 325 MG PO TABS
650.0000 mg | ORAL_TABLET | Freq: Four times a day (QID) | ORAL | Status: DC | PRN
  Administered 2014-03-30 – 2014-04-01 (×3): 650 mg via ORAL
  Filled 2014-03-30 (×3): qty 2

## 2014-03-30 MED ORDER — POTASSIUM CHLORIDE 10 MEQ/100ML IV SOLN
10.0000 meq | INTRAVENOUS | Status: AC
Start: 2014-03-30 — End: 2014-03-30
  Administered 2014-03-30 (×3): 10 meq via INTRAVENOUS
  Filled 2014-03-30 (×4): qty 100

## 2014-03-30 MED ORDER — ONDANSETRON HCL 4 MG/2ML IJ SOLN: 4.0000 mg | Freq: Four times a day (QID) | INTRAMUSCULAR | Status: DC | PRN

## 2014-03-30 NOTE — ED Notes (Signed)
Critical value from lab of K+ 2.3.  Advised Dr. Lacinda Axon.

## 2014-03-30 NOTE — ED Notes (Signed)
Radiology at bedside to complete portable chest.

## 2014-03-30 NOTE — ED Notes (Signed)
Patient denies pain and is resting comfortably.  

## 2014-03-30 NOTE — H&P (Signed)
[facility] History and Physical  [name] XFG:[id number] DOB: [birth year] DOA: 03/30/2014  Referring physician: er PCP: Walker Kehr, MD   Chief Complaint: fall  HPI: [name] is a 68 y.o. female  Who had a colonoscopy a few days ago after she was admitted in May with ileitis and ABLA- Crohn's flare.    Comes back to the ER after losing her balance and falling x 2.  She hit her head, denies LOC.  She admits to few days of fever and chills.  Denies dysuria  No neck stiffness.  Patient is hard of hear and no family is present  She also describes LE edema that has worsened since her last admission.    In the Er, she was febrile, had what appears to be a UTI.  Her K was found to be low.  CT scan of head shows hematoma.  Await Mg and lactic acid- will place in SDU as BP has been falling during ER course.     Review of Systems:  All systems reviewed, negative unless stated above   Past Medical History  Diagnosis Date  . Personal history of peptic ulcer disease   . Ventral hernia, unspecified, without mention of obstruction or gangrene   . Degeneration of cervical intervertebral disc   . Crohn's disease   . Unspecified constipation   . Unspecified intestinal obstruction   . Cirrhosis   . Type II or unspecified type diabetes mellitus without mention of complication, not stated as uncontrolled   . Unspecified essential hypertension   . Subarachnoid hemorrhage   . Depressive disorder, not elsewhere classified   . Iron deficiency anemia, unspecified   . Lumbago   . Unspecified hearing loss   . Urinary tract infection, site not specified   . Morbid obesity   . Personal history of urinary calculi   . Family history of diabetes mellitus   . Anemia   . Fibromyalgia   . Portal venous hypertension   . Splenomegaly   . History of pancreatitis   . Suicide attempt 04/2000  . Internal hemorrhoids    Past Surgical History  Procedure Laterality Date  . Cholecystectomy    .  Abdominal hysterectomy    . Partial colectomy    . Breast cyst excision    . Orif tibia & fibula fractures Right   . Liver biopsy    . Cyst wrist Right   . Hernia repair Bilateral     with mesh x3 times  . Multiple abdominal surgeries     Social History:  reports that she quit smoking about 45 years ago. She has never used smokeless tobacco. She reports that she does not drink alcohol or use illicit drugs.  Allergies  Allergen Reactions  . Amoxicillin-Pot Clavulanate Diarrhea    REACTION: diarrhea  . Ciprofloxacin Other (See Comments)    REACTION: swelling of LE  . Duragesic Disc Transdermal System [Alcohol-Fentanyl] Itching    Itching from patch  . Metformin Diarrhea  . Sulfamethoxazole-Trimethoprim Swelling    REACTION: leg edema  . Tetracycline Hcl Itching  . Tramadol Other (See Comments)    Headache  . Azithromycin Rash    Family History  Problem Relation Age of Onset  . Diabetes Other   . Clotting disorder Other   . Colitis Other   . Crohn's disease Other   . Heart disease Sister   . Irritable bowel syndrome    . Colon cancer Neg Hx      Prior to  Admission medications   Medication Sig Start Date End Date Taking? Authorizing [name]  ACCU-CHEK COMPACT TEST DRUM test strip USE 4 TIMES A DAY AS DIRECTED 09/15/12   Aleksei Plotnikov V, MD  buPROPion (WELLBUTRIN SR) 150 MG 12 hr tablet TAKE 1 TABLET EVERY MORNING 11/07/13   Aleksei Plotnikov V, MD  Cholecalciferol (VITAMIN D3) 1000 UNITS tablet Take 1,000 Units by mouth daily.      Historical Wayde Gopaul, MD  citalopram (CELEXA) 10 MG tablet Take 1 tablet (10 mg total) by mouth daily. 03/06/14   Aleksei Plotnikov V, MD  Diclofenac Sodium 3 % GEL  03/13/14   Historical Marisue Canion, MD  gabapentin (NEURONTIN) 800 MG tablet Take 1 tablet (800 mg total) by mouth 4 (four) times daily as needed (leg pain). 11/06/13   Aleksei Plotnikov V, MD  glucose blood (FREESTYLE LITE) test strip Use as directed to check blood sugar twice daily dx  250.00 04/04/13   Aleksei Plotnikov V, MD  insulin lispro (HUMALOG KWIKPEN) 100 UNIT/ML KiwkPen Inject 0.1 mLs (10 Units total) into the skin 3 (three) times daily before meals. 03/01/14   Bobby Rumpf York, PA-C  Insulin Pen Needle (B-D UF III MINI PEN NEEDLES) 31G X 5 MM MISC as directed.      Historical Mylik Pro, MD  ketoconazole (NIZORAL) 2 % cream Apply 1 application topically daily. 12/22/13   Aleksei Plotnikov V, MD  lactulose (CHRONULAC) 10 GM/15ML solution TAKE 30ML'S BY MOUTH TWO TO THREE TIMES A WEEK 11/07/12   Aleksei Plotnikov V, MD  Lancets (FREESTYLE) lancets 1 each by Other route 2 (two) times daily as needed. Use as instructed     Historical Eliyahu Bille, MD  mupirocin ointment (BACTROBAN) 2 % Place 1 application into the nose 2 (two) times daily. With dressing  change    Historical Tationa Stech, MD  potassium chloride (KLOR-CON 10) 10 MEQ tablet Take 1 tablet (10 mEq total) by mouth 2 (two) times daily. 03/07/14   Aleksei Plotnikov V, MD  predniSONE (DELTASONE) 20 MG tablet Take 2 tablets (40 mg total) by mouth daily with breakfast. 03/01/14   Melton Alar, PA-C  spironolactone (ALDACTONE) 100 MG tablet Take 100 mg by mouth daily.      Historical Thelda Gagan, MD  terconazole (TERAZOL 7) 0.4 % vaginal cream Place 1 applicator vaginally at bedtime. 12/25/13   Aleksei Plotnikov V, MD  torsemide (DEMADEX) 20 MG tablet TAKE 2 TABLETS BY MOUTH DAILY AS NEEDED FOR SWELLING 02/14/13   Aleksei Plotnikov V, MD  VOLTAREN 1 % GEL Apply 1 application topically 4 (four) times daily. 04/01/11   Cassandria Anger, MD   Physical Exam: Filed Vitals:   03/30/14 1237  BP: 98/60  Pulse: 95  Temp:   Resp: 16    BP 98/60  Pulse 95  Temp(Src) 102.3 F (39.1 C) (Oral)  Resp 16  SpO2 95%  General:  Appears calm and comfortable- hard of hearing, small hematoma on occiput Eyes: PERRL, normal lids, irises & conjunctiva ENT: grossly normal hearing, lips & tongue Neck: no LAD, masses or thyromegaly Cardiovascular:  RRR, no m/r/g. LE edema with some redness on shins b/l Respiratory: CTA bilaterally, no w/r/r. Normal respiratory effort. Abdomen: soft, ntnd Skin: no rash or induration seen on limited exam Musculoskeletal: grossly normal tone BUE/BLE Psychiatric: grossly normal mood and affect, speech fluent and appropriate Neurologic: weakness all over          Labs on Admission:  Basic Metabolic Panel:  Recent Labs Lab 03/30/14 0900  NA 137  K 2.3*  CL 100  CO2 23  GLUCOSE 200*  BUN 8  CREATININE 0.79  CALCIUM 8.7   Liver Function Tests: No results found for this basename: AST, ALT, ALKPHOS, BILITOT, PROT, ALBUMIN,  in the last 168 hours No results found for this basename: LIPASE, AMYLASE,  in the last 168 hours No results found for this basename: AMMONIA,  in the last 168 hours CBC:  Recent Labs Lab 03/30/14 0900  WBC 6.5  HGB 10.7*  HCT 30.5*  MCV 90.0  PLT 20*   Cardiac Enzymes: No results found for this basename: CKTOTAL, CKMB, CKMBINDEX, TROPONINI,  in the last 168 hours  BNP (last 3 results)  Recent Labs  10/29/13 0631  PROBNP 200.8*   CBG:  Recent Labs Lab 03/28/14 1302 03/28/14 1407 03/28/14 1450  GLUCAP 315* 327* 305*    Radiological Exams on Admission: Ct Head Wo Contrast  03/30/2014   CLINICAL DATA:  Frequent falls, history cirrhosis, diabetes, essential hypertension  EXAM: CT HEAD WITHOUT CONTRAST  TECHNIQUE: Contiguous axial images were obtained from the base of the skull through the vertex without intravenous contrast.  COMPARISON:  06/03/2010  FINDINGS: Generalized atrophy.  Normal ventricular morphology.  No midline shift or mass effect.  Small vessel chronic ischemic changes of deep cerebral white matter.  No intracranial hemorrhage, mass lesion, or acute infarction.  Visualized paranasal sinuses and mastoid air cells clear.  Bones unremarkable.  Atherosclerotic calcifications at skullbase.  LEFT temporo-occipital scalp hematoma.  IMPRESSION:LEFT  TEMPORO-OCCIPITAL SCALP HEMATOMA.: IMPRESSION:LEFT TEMPORO-OCCIPITAL SCALP HEMATOMA. Atrophy with small vessel chronic ischemic changes of deep cerebral white matter.  No acute intracranial abnormalities.  LEFT temporo-occipital scalp hematoma.   Electronically Signed   By: Lavonia Dana M.D.   On: 03/30/2014 10:59   Dg Chest Port 1 View  03/30/2014   CLINICAL DATA:  Shortness of breath, weakness  EXAM: PORTABLE CHEST - 1 VIEW  COMPARISON:  Portable exam 0901 hr compared to 02/23/2014  FINDINGS: Enlargement of cardiac silhouette with mitral annular calcification.  Mediastinal contours and pulmonary vascularity normal.  Mild bibasilar atelectasis.  No acute failure or consolidation.  No pleural effusion or pneumothorax.  Bones unremarkable.  IMPRESSION: Enlargement of cardiac silhouette.  Mild bibasilar atelectasis.   Electronically Signed   By: Lavonia Dana M.D.   On: 03/30/2014 09:07    EKG: Independently reviewed. Sinus tachy 109  Assessment/Plan Principal Problem:   UTI (lower urinary tract infection) Active Problems:   DIABETES MELLITUS, TYPE II   Morbid obesity   THROMBOCYTOPENIA   CIRRHOSIS   Hyperglycemia  UTI- await culture, rocephin IV, await lactic acid  Hypotension- IVF, hold diuretics  DM- uncontrolled, SSI, home meds, check HgbA1C  hypokalemia- replete, check Mg  Thrombocytopenia- no heparin/lovonox, trend- has cirrhosis  Morbid obesity  Fall- PT eval when medically ready  Fever- pan culture- no sign of PNA  Crohn's colitis- steroid taper, colonoscopy few days ago  Code Status: full Family Communication: patient Disposition Plan: admit to SDU overnight- most likely can be transferred out in AM if stable  Time spent: 75 min  Eliseo Squires, JESSICA [facility] Pager [phone]  **Disclaimer: This note may have been dictated with voice recognition software. Similar sounding words can inadvertently be transcribed and this note may contain transcription errors which may  not have been corrected upon publication of note.**

## 2014-03-30 NOTE — Progress Notes (Signed)
VASCULAR LAB PRELIMINARY  PRELIMINARY  PRELIMINARY  PRELIMINARY  Bilateral lower extremity venous duplex completed.    Preliminary report:  Bilateral:  No evidence of DVT, superficial thrombosis, or Baker's Cyst.   SLAUGHTER, [place], RVS 03/30/2014, 4:10 PM

## 2014-03-30 NOTE — ED Notes (Signed)
Did in and out cath on patient dark urine in return

## 2014-03-30 NOTE — ED Provider Notes (Addendum)
CSN: [id number]     Arrival date & time 03/30/14  0174 History   First MD Initiated Contact with Patient 03/30/14 [phone]     Chief Complaint  Patient presents with  . Fall     (Consider location/radiation/quality/duration/timing/severity/associated sxs/prior Treatment) HPI..... level V caveat for urgent need for intervention. Patient apparently fell this morning x2. She hit the back of her head. Additionally she has been running a fever. No previous stiff neck, cough, dysuria. Severity is moderate. She has a history of frequent falls. Thrombocytopenia has been noted in the past month. She is generally unhealthy with a host of medical problems  Past Medical History  Diagnosis Date  . Personal history of peptic ulcer disease   . Ventral hernia, unspecified, without mention of obstruction or gangrene   . Degeneration of cervical intervertebral disc   . Crohn's disease   . Unspecified constipation   . Unspecified intestinal obstruction   . Cirrhosis   . Type II or unspecified type diabetes mellitus without mention of complication, not stated as uncontrolled   . Unspecified essential hypertension   . Subarachnoid hemorrhage   . Depressive disorder, not elsewhere classified   . Iron deficiency anemia, unspecified   . Lumbago   . Unspecified hearing loss   . Urinary tract infection, site not specified   . Morbid obesity   . Personal history of urinary calculi   . Family history of diabetes mellitus   . Anemia   . Fibromyalgia   . Portal venous hypertension   . Splenomegaly   . History of pancreatitis   . Suicide attempt 04/2000  . Internal hemorrhoids    Past Surgical History  Procedure Laterality Date  . Cholecystectomy    . Abdominal hysterectomy    . Partial colectomy    . Breast cyst excision    . Orif tibia & fibula fractures Right   . Liver biopsy    . Cyst wrist Right   . Hernia repair Bilateral     with mesh x3 times  . Multiple abdominal surgeries     Family  History  Problem Relation Age of Onset  . Diabetes Other   . Clotting disorder Other   . Colitis Other   . Crohn's disease Other   . Heart disease Sister   . Irritable bowel syndrome    . Colon cancer Neg Hx    History  Substance Use Topics  . Smoking status: Former Smoker    Quit date: 10/05/1968  . Smokeless tobacco: Never Used  . Alcohol Use: No   OB History   Grav Para Term Preterm Abortions TAB SAB Ect Mult Living                 Review of Systems  Unable to perform ROS: Other      Allergies  Amoxicillin-pot clavulanate; Ciprofloxacin; Duragesic disc transdermal system; Metformin; Sulfamethoxazole-trimethoprim; Tetracycline hcl; Tramadol; and Azithromycin  Home Medications   Prior to Admission medications   Medication Sig Start Date End Date Taking? Authorizing Provider  ACCU-CHEK COMPACT TEST DRUM test strip USE 4 TIMES A DAY AS DIRECTED 09/15/12   Aleksei Plotnikov V, MD  buPROPion (WELLBUTRIN SR) 150 MG 12 hr tablet TAKE 1 TABLET EVERY MORNING 11/07/13   Aleksei Plotnikov V, MD  Cholecalciferol (VITAMIN D3) 1000 UNITS tablet Take 1,000 Units by mouth daily.      Historical Provider, MD  citalopram (CELEXA) 10 MG tablet Take 1 tablet (10 mg total) by mouth daily. 03/06/14  Aleksei Plotnikov V, MD  Diclofenac Sodium 3 % GEL  03/13/14   Historical Provider, MD  gabapentin (NEURONTIN) 800 MG tablet Take 1 tablet (800 mg total) by mouth 4 (four) times daily as needed (leg pain). 11/06/13   Aleksei Plotnikov V, MD  glucose blood (FREESTYLE LITE) test strip Use as directed to check blood sugar twice daily dx 250.00 04/04/13   Aleksei Plotnikov V, MD  insulin lispro (HUMALOG KWIKPEN) 100 UNIT/ML KiwkPen Inject 0.1 mLs (10 Units total) into the skin 3 (three) times daily before meals. 03/01/14   Bobby Rumpf York, PA-C  Insulin Pen Needle (B-D UF III MINI PEN NEEDLES) 31G X 5 MM MISC as directed.      Historical Provider, MD  ketoconazole (NIZORAL) 2 % cream Apply 1 application  topically daily. 12/22/13   Aleksei Plotnikov V, MD  lactulose (CHRONULAC) 10 GM/15ML solution TAKE 30ML'S BY MOUTH TWO TO THREE TIMES A WEEK 11/07/12   Aleksei Plotnikov V, MD  Lancets (FREESTYLE) lancets 1 each by Other route 2 (two) times daily as needed. Use as instructed     Historical Provider, MD  mupirocin ointment (BACTROBAN) 2 % Place 1 application into the nose 2 (two) times daily. With dressing  change    Historical Provider, MD  potassium chloride (KLOR-CON 10) 10 MEQ tablet Take 1 tablet (10 mEq total) by mouth 2 (two) times daily. 03/07/14   Aleksei Plotnikov V, MD  predniSONE (DELTASONE) 20 MG tablet Take 2 tablets (40 mg total) by mouth daily with breakfast. 03/01/14   Melton Alar, PA-C  spironolactone (ALDACTONE) 100 MG tablet Take 100 mg by mouth daily.      Historical Provider, MD  terconazole (TERAZOL 7) 0.4 % vaginal cream Place 1 applicator vaginally at bedtime. 12/25/13   Aleksei Plotnikov V, MD  torsemide (DEMADEX) 20 MG tablet TAKE 2 TABLETS BY MOUTH DAILY AS NEEDED FOR SWELLING 02/14/13   Aleksei Plotnikov V, MD  VOLTAREN 1 % GEL Apply 1 application topically 4 (four) times daily. 04/01/11   Aleksei Plotnikov V, MD   BP 98/60  Pulse 95  Temp(Src) 102.3 F (39.1 C) (Oral)  Resp 16  SpO2 95% Physical Exam  Nursing note and vitals reviewed. Constitutional: She is oriented to person, place, and time.  Alert, hard of hearing, appears older than stated age  HENT:  Head: Normocephalic.  Small occipital hematoma  Eyes: Conjunctivae and EOM are normal. Pupils are equal, round, and reactive to light.  Neck: Normal range of motion. Neck supple.  Cardiovascular: Normal rate, regular rhythm and normal heart sounds.   Pulmonary/Chest: Effort normal and breath sounds normal.  Abdominal: Soft. Bowel sounds are normal.  Musculoskeletal: Normal range of motion.  Neurological: She is alert and oriented to person, place, and time.  Skin: Skin is warm and dry.  Psychiatric: She has a  normal mood and affect. Her behavior is normal.    ED Course  Procedures (including critical care time) Labs Review Labs Reviewed  CBC - Abnormal; Notable for the following:    RBC 3.39 (*)    Hemoglobin 10.7 (*)    HCT 30.5 (*)    RDW 17.4 (*)    Platelets 20 (*)    All other components within normal limits  BASIC METABOLIC PANEL - Abnormal; Notable for the following:    Potassium 2.3 (*)    Glucose, Bld 200 (*)    GFR calc non Af Amer 85 (*)    All other components within  normal limits  URINALYSIS, ROUTINE W REFLEX MICROSCOPIC - Abnormal; Notable for the following:    APPearance CLOUDY (*)    Glucose, UA >1000 (*)    Hgb urine dipstick LARGE (*)    Bilirubin Urine SMALL (*)    Protein, ur 100 (*)    Urobilinogen, UA 4.0 (*)    Leukocytes, UA SMALL (*)    All other components within normal limits  URINE CULTURE  URINE MICROSCOPIC-ADD ON    Imaging Review Ct Head Wo Contrast  03/30/2014   CLINICAL DATA:  Frequent falls, history cirrhosis, diabetes, essential hypertension  EXAM: CT HEAD WITHOUT CONTRAST  TECHNIQUE: Contiguous axial images were obtained from the base of the skull through the vertex without intravenous contrast.  COMPARISON:  06/03/2010  FINDINGS: Generalized atrophy.  Normal ventricular morphology.  No midline shift or mass effect.  Small vessel chronic ischemic changes of deep cerebral white matter.  No intracranial hemorrhage, mass lesion, or acute infarction.  Visualized paranasal sinuses and mastoid air cells clear.  Bones unremarkable.  Atherosclerotic calcifications at skullbase.  LEFT temporo-occipital scalp hematoma.  IMPRESSION:LEFT TEMPORO-OCCIPITAL SCALP HEMATOMA.: IMPRESSION:LEFT TEMPORO-OCCIPITAL SCALP HEMATOMA. Atrophy with small vessel chronic ischemic changes of deep cerebral white matter.  No acute intracranial abnormalities.  LEFT temporo-occipital scalp hematoma.   Electronically Signed   By: Lavonia Dana M.D.   On: 03/30/2014 10:59   Dg Chest Port  1 View  03/30/2014   CLINICAL DATA:  Shortness of breath, weakness  EXAM: PORTABLE CHEST - 1 VIEW  COMPARISON:  Portable exam 0901 hr compared to 02/23/2014  FINDINGS: Enlargement of cardiac silhouette with mitral annular calcification.  Mediastinal contours and pulmonary vascularity normal.  Mild bibasilar atelectasis.  No acute failure or consolidation.  No pleural effusion or pneumothorax.  Bones unremarkable.  IMPRESSION: Enlargement of cardiac silhouette.  Mild bibasilar atelectasis.   Electronically Signed   By: Lavonia Dana M.D.   On: 03/30/2014 09:07     EKG Interpretation   Date/Time:  Friday March 30 2014 08:22:49 EDT Ventricular Rate:  109 PR Interval:  165 QRS Duration: 70 QT Interval:  334 QTC Calculation: 450 R Axis:   45 Text Interpretation:  Sinus tachycardia Low voltage, extremity and  precordial leads Minimal ST depression, lateral leads Confirmed by COOK   MD, BRIAN ([id number]) on 03/30/2014 8:29:48 AM     CRITICAL CARE Performed by: Nat Christen Total critical care time: 30 Critical care time was exclusive of separately billable procedures and treating other patients. Critical care was necessary to treat or prevent imminent or life-threatening deterioration. Critical care was time spent personally by me on the following activities: development of treatment plan with patient and/or surrogate as well as nursing, discussions with consultants, evaluation of patient's response to treatment, examination of patient, obtaining history from patient or surrogate, ordering and performing treatments and interventions, ordering and review of laboratory studies, ordering and review of radiographic studies, pulse oximetry and re-evaluation of patient's condition. MDM   Final diagnoses:  UTI (lower urinary tract infection)  Hypokalemia    Patient is reasonably alert. She is febrile. Head CT negative. Chest x-ray shows no pneumonia. Laboratory evaluation reveals a urinary tract infection  and hypokalemia.  IV Rocephin, urine culture, oral potassium and IV potassium. Admit.    Nat Christen, MD 03/30/14 [place], MD 03/30/14 [phone]

## 2014-03-30 NOTE — ED Notes (Signed)
Dr. Vann at bedside 

## 2014-03-30 NOTE — ED Notes (Signed)
Critical value from lab of Platelets 20.  Lacinda Axon, MD notified.

## 2014-03-30 NOTE — ED Notes (Signed)
Also per EMS, patient is not on any blood thinners.

## 2014-03-30 NOTE — ED Notes (Signed)
Per EMS, patient coming from home with a history of frequent falls.   Patient husband had stated to EMS that she fell x 2 this morning.   One of falls, patient had hit the back of her head.  Patient has a moderate size hematoma on the back of her head.   No other injuries.   Patient is hard of hearing.   When EMS checked, patient was 102 tympanic and they did give tylenol 1000 syrup PO to patient.   Patient has pitting edema bilateral legs and feet.   CBG 156.    BP 158/96.   No IV established by EMS PTA.

## 2014-03-31 DIAGNOSIS — E119 Type 2 diabetes mellitus without complications: Secondary | ICD-10-CM

## 2014-03-31 LAB — CBC
HCT: 29.1 % — ABNORMAL LOW (ref 36.0–46.0)
HEMOGLOBIN: 9.9 g/dL — AB (ref 12.0–15.0)
MCH: 31.2 pg (ref 26.0–34.0)
MCHC: 34 g/dL (ref 30.0–36.0)
MCV: 91.8 fL (ref 78.0–100.0)
Platelets: 17 10*3/uL — CL (ref 150–400)
RBC: 3.17 MIL/uL — ABNORMAL LOW (ref 3.87–5.11)
RDW: 17.9 % — ABNORMAL HIGH (ref 11.5–15.5)
WBC: 3.5 10*3/uL — ABNORMAL LOW (ref 4.0–10.5)

## 2014-03-31 LAB — BASIC METABOLIC PANEL
BUN: 10 mg/dL (ref 6–23)
CHLORIDE: 105 meq/L (ref 96–112)
CO2: 25 meq/L (ref 19–32)
Calcium: 8.1 mg/dL — ABNORMAL LOW (ref 8.4–10.5)
Creatinine, Ser: 0.85 mg/dL (ref 0.50–1.10)
GFR calc Af Amer: 81 mL/min — ABNORMAL LOW (ref >90)
GFR calc non Af Amer: 70 mL/min — ABNORMAL LOW (ref >90)
GLUCOSE: 254 mg/dL — AB (ref 70–99)
POTASSIUM: 3.2 meq/L — AB (ref 3.7–5.3)
Sodium: 139 mEq/L (ref 137–147)

## 2014-03-31 LAB — URINE CULTURE
COLONY COUNT: NO GROWTH
CULTURE: NO GROWTH

## 2014-03-31 LAB — GLUCOSE, CAPILLARY
GLUCOSE-CAPILLARY: 128 mg/dL — AB (ref 70–99)
GLUCOSE-CAPILLARY: 171 mg/dL — AB (ref 70–99)

## 2014-03-31 LAB — HEMOGLOBIN A1C
Hgb A1c MFr Bld: 9.7 % — ABNORMAL HIGH (ref <5.7)
Mean Plasma Glucose: 232 mg/dL — ABNORMAL HIGH (ref <117)

## 2014-03-31 LAB — CLOSTRIDIUM DIFFICILE BY PCR: Toxigenic C. Difficile by PCR: NEGATIVE

## 2014-03-31 MED ORDER — POTASSIUM CHLORIDE CRYS ER 20 MEQ PO TBCR
40.0000 meq | EXTENDED_RELEASE_TABLET | Freq: Once | ORAL | Status: AC
  Administered 2014-03-31: 40 meq via ORAL
  Filled 2014-03-31: qty 2

## 2014-03-31 MED ORDER — MAGNESIUM SULFATE 40 MG/ML IJ SOLN
2.0000 g | Freq: Once | INTRAMUSCULAR | Status: AC
Start: 2014-03-31 — End: 2014-03-31
  Administered 2014-03-31: 2 g via INTRAVENOUS
  Filled 2014-03-31: qty 50

## 2014-03-31 MED ORDER — INSULIN GLARGINE 100 UNIT/ML ~~LOC~~ SOLN
10.0000 [IU] | Freq: Every day | SUBCUTANEOUS | Status: DC
Start: 2014-03-31 — End: 2014-04-02
  Filled 2014-03-31 (×3): qty 0.1

## 2014-03-31 NOTE — Progress Notes (Signed)
Pt transferred to [id number].

## 2014-03-31 NOTE — Progress Notes (Signed)
Patient trasfered from 3S15 to [phone] via bed; alert and oriented x 4; no complaints of pain; IV saline locked in LAC and LH; skin intact with ecchymosis on both hans , right hip and abdomen; redness on groin area and perineum; hematoma on left temporo-occipital scalp.  Orient patient to room and unit; gave patient care guide; instructed how to use the call bell and  fall risk precautions. Will continue to monitor the patient.

## 2014-03-31 NOTE — Progress Notes (Signed)
CRITICAL VALUE ALERT  Critical value received:  Platelets 17  Date of notification:  03/31/14  Time of notification:  [id number]  Critical value read back:Yes.    Nurse who received alert:  D. Aleene Davidson, RN  MD notified (1st page):  Fredirick Maudlin, NP  Time of first page:  [phone]  MD notified (2nd page):  Time of second page:  Responding MD:  Fredirick Maudlin, NP  Time MD responded:  [phone]  No orders received. Continue to monitor and notify oncoming shift.

## 2014-03-31 NOTE — Progress Notes (Addendum)
PROGRESS NOTE  [name] PYP:[id number] DOB: [birth year] DOA: 03/30/2014 PCP: Walker Kehr, MD  Assessment/Plan: UTI- await culture, rocephin IV LA: 2  Hypotension- d/c IVF, cont to hold diuretics for now   DM- uncontrolled, SSI, home meds,  HgbA1C 9.7 -add lantus  hypokalemia- replete  hypomagnesium- replete  Thrombocytopenia- no heparin/lovonox, trend- has cirrhosis   Morbid obesity   Fall- PT eval when medically ready   Fever- pan culture- no sign of PNA   Crohn's colitis- steroid taper, colonoscopy few days ago  Diarrhea- check c diff- had recent colonoscopy so may be related recent  LE edema -duplex negative  Code Status: full Family Communication: patient Disposition Plan: tx to med surg floor   Consultants:    Procedures:  none    HPI/Subjective: Feeling better  Objective: Filed Vitals:   03/31/14 0727  BP: 110/59  Pulse: 87  Temp: 98.6 F (37 C)  Resp: 20    Intake/Output Summary (Last 24 hours) at 03/31/14 0823 Last data filed at 03/31/14 0500  Gross per 24 hour  Intake   1411 ml  Output    550 ml  Net    861 ml   Filed Weights   03/30/14 1550  Weight: 85.8 kg (189 lb 2.5 oz)    Exam:   General:  A+Ox3, NAD  Cardiovascular: rrr  Respiratory: clear, no wheezing  Abdomen: +BS, soft  Musculoskeletal: +LE, edema   Data Reviewed: Basic Metabolic Panel:  Recent Labs Lab 03/30/14 0900 03/30/14 1330 03/30/14 1942 03/31/14 0235  NA 137  --  139 139  K 2.3*  --  3.0* 3.2*  CL 100  --  103 105  CO2 23  --  23 25  GLUCOSE 200*  --  266* 254*  BUN 8  --  10 10  CREATININE 0.79  --  0.82 0.85  CALCIUM 8.7  --  8.1* 8.1*  MG  --  1.4*  --   --    Liver Function Tests: No results found for this basename: AST, ALT, ALKPHOS, BILITOT, PROT, ALBUMIN,  in the last 168 hours No results found for this basename: LIPASE, AMYLASE,  in the last 168 hours No results found for this basename: AMMONIA,  in the last 168  hours CBC:  Recent Labs Lab 03/30/14 0900 03/31/14 0235  WBC 6.5 3.5*  HGB 10.7* 9.9*  HCT 30.5* 29.1*  MCV 90.0 91.8  PLT 20* 17*   Cardiac Enzymes: No results found for this basename: CKTOTAL, CKMB, CKMBINDEX, TROPONINI,  in the last 168 hours BNP (last 3 results)  Recent Labs  10/29/13 0631  PROBNP 200.8*   CBG:  Recent Labs Lab 03/28/14 1302 03/28/14 1407 03/28/14 1450 03/30/14 2135  GLUCAP 315* 327* 305* 262*    Recent Results (from the past 240 hour(s))  MRSA PCR SCREENING     Status: None   Collection Time    03/30/14  3:10 PM      Result Value Ref Range Status   MRSA by PCR NEGATIVE  NEGATIVE Final   Comment:            The GeneXpert MRSA Assay (FDA     approved for NASAL specimens     only), is one component of a     comprehensive MRSA colonization     surveillance program. It is not     intended to diagnose MRSA     infection nor to guide or     monitor treatment for  MRSA infections.     Studies: Ct Head Wo Contrast  03/30/2014   CLINICAL DATA:  Frequent falls, history cirrhosis, diabetes, essential hypertension  EXAM: CT HEAD WITHOUT CONTRAST  TECHNIQUE: Contiguous axial images were obtained from the base of the skull through the vertex without intravenous contrast.  COMPARISON:  06/03/2010  FINDINGS: Generalized atrophy.  Normal ventricular morphology.  No midline shift or mass effect.  Small vessel chronic ischemic changes of deep cerebral white matter.  No intracranial hemorrhage, mass lesion, or acute infarction.  Visualized paranasal sinuses and mastoid air cells clear.  Bones unremarkable.  Atherosclerotic calcifications at skullbase.  LEFT temporo-occipital scalp hematoma.  IMPRESSION:LEFT TEMPORO-OCCIPITAL SCALP HEMATOMA.: IMPRESSION:LEFT TEMPORO-OCCIPITAL SCALP HEMATOMA. Atrophy with small vessel chronic ischemic changes of deep cerebral white matter.  No acute intracranial abnormalities.  LEFT temporo-occipital scalp hematoma.    Electronically Signed   By: Lavonia Dana M.D.   On: 03/30/2014 10:59   Dg Chest Port 1 View  03/30/2014   CLINICAL DATA:  Shortness of breath, weakness  EXAM: PORTABLE CHEST - 1 VIEW  COMPARISON:  Portable exam 0901 hr compared to 02/23/2014  FINDINGS: Enlargement of cardiac silhouette with mitral annular calcification.  Mediastinal contours and pulmonary vascularity normal.  Mild bibasilar atelectasis.  No acute failure or consolidation.  No pleural effusion or pneumothorax.  Bones unremarkable.  IMPRESSION: Enlargement of cardiac silhouette.  Mild bibasilar atelectasis.   Electronically Signed   By: Lavonia Dana M.D.   On: 03/30/2014 09:07    Scheduled Meds: . buPROPion  150 mg Oral Daily  . cefTRIAXone (ROCEPHIN)  IV  1 g Intravenous Q24H  . citalopram  10 mg Oral Daily  . insulin aspart  0-15 Units Subcutaneous TID WC  . insulin aspart  0-5 Units Subcutaneous QHS  . insulin aspart  10 Units Subcutaneous TID WC  . magnesium sulfate 1 - 4 g bolus IVPB  2 g Intravenous Once  . potassium chloride  10 mEq Oral BID  . predniSONE  40 mg Oral Q breakfast  . sodium chloride  3 mL Intravenous Q12H   Continuous Infusions: . sodium chloride 75 mL/hr at 03/31/14 0139   Antibiotics Given (last 72 hours)   Date/Time Action Medication Dose Rate   03/30/14 1627 Given   cefTRIAXone (ROCEPHIN) 1 g in dextrose 5 % 50 mL IVPB 1 g 100 mL/hr      Principal Problem:   UTI (lower urinary tract infection) Active Problems:   DIABETES MELLITUS, TYPE II   Morbid obesity   THROMBOCYTOPENIA   CIRRHOSIS   Hyperglycemia    Time spent: 35 min    [name]  [facility] Pager [phone]. If 7PM-7AM, please contact night-coverage at [email or web address], password [facility] 03/31/2014, 8:23 AM  LOS: 1 day

## 2014-03-31 NOTE — Progress Notes (Signed)
Report called to [facility] RN who is receiving pt on 5W.

## 2014-04-01 DIAGNOSIS — IMO0001 Reserved for inherently not codable concepts without codable children: Secondary | ICD-10-CM

## 2014-04-01 DIAGNOSIS — E876 Hypokalemia: Secondary | ICD-10-CM

## 2014-04-01 LAB — GLUCOSE, CAPILLARY
Glucose-Capillary: 175 mg/dL — ABNORMAL HIGH (ref 70–99)
Glucose-Capillary: 92 mg/dL (ref 70–99)
Glucose-Capillary: 170 mg/dL — ABNORMAL HIGH (ref 70–99)
Glucose-Capillary: 111 mg/dL — ABNORMAL HIGH (ref 70–99)

## 2014-04-01 LAB — CBC
HCT: 31.9 % — ABNORMAL LOW (ref 36.0–46.0)
HEMOGLOBIN: 10.8 g/dL — AB (ref 12.0–15.0)
MCH: 31.4 pg (ref 26.0–34.0)
MCHC: 33.9 g/dL (ref 30.0–36.0)
MCV: 92.7 fL (ref 78.0–100.0)
PLATELETS: 18 10*3/uL — AB (ref 150–400)
RBC: 3.44 MIL/uL — ABNORMAL LOW (ref 3.87–5.11)
RDW: 18.3 % — ABNORMAL HIGH (ref 11.5–15.5)
WBC: 3.1 10*3/uL — ABNORMAL LOW (ref 4.0–10.5)

## 2014-04-01 LAB — BASIC METABOLIC PANEL
BUN: 9 mg/dL (ref 6–23)
CALCIUM: 8 mg/dL — AB (ref 8.4–10.5)
CO2: 23 meq/L (ref 19–32)
Chloride: 105 mEq/L (ref 96–112)
Creatinine, Ser: 0.8 mg/dL (ref 0.50–1.10)
GFR calc Af Amer: 87 mL/min — ABNORMAL LOW (ref >90)
GFR calc non Af Amer: 75 mL/min — ABNORMAL LOW (ref >90)
Glucose, Bld: 166 mg/dL — ABNORMAL HIGH (ref 70–99)
POTASSIUM: 3.2 meq/L — AB (ref 3.7–5.3)
SODIUM: 141 meq/L (ref 137–147)

## 2014-04-01 MED ORDER — SPIRONOLACTONE 100 MG PO TABS
100.0000 mg | ORAL_TABLET | Freq: Every day | ORAL | Status: DC
  Administered 2014-04-01 – 2014-04-02 (×2): 100 mg via ORAL
  Filled 2014-04-01 (×2): qty 1

## 2014-04-01 MED ORDER — BUPROPION HCL ER (SR) 150 MG PO TB12: 150.0000 mg | ORAL_TABLET | Freq: Every day | ORAL | Status: DC

## 2014-04-01 MED ORDER — GABAPENTIN 400 MG PO CAPS
800.0000 mg | ORAL_CAPSULE | Freq: Four times a day (QID) | ORAL | Status: DC
  Filled 2014-04-01 (×3): qty 2

## 2014-04-01 MED ORDER — TORSEMIDE 20 MG PO TABS
20.0000 mg | ORAL_TABLET | Freq: Every day | ORAL | Status: DC
  Administered 2014-04-01 – 2014-04-02 (×2): 20 mg via ORAL
  Filled 2014-04-01 (×2): qty 1

## 2014-04-01 MED ORDER — GABAPENTIN 800 MG PO TABS: 800.0000 mg | ORAL_TABLET | Freq: Four times a day (QID) | ORAL | Status: DC

## 2014-04-01 MED ORDER — GABAPENTIN 100 MG PO CAPS
200.0000 mg | ORAL_CAPSULE | Freq: Three times a day (TID) | ORAL | Status: DC
  Administered 2014-04-01 – 2014-04-02 (×3): 200 mg via ORAL
  Filled 2014-04-01 (×4): qty 2

## 2014-04-01 MED ORDER — POTASSIUM CHLORIDE 10 MEQ/100ML IV SOLN
10.0000 meq | INTRAVENOUS | Status: AC
  Administered 2014-04-01 (×2): 10 meq via INTRAVENOUS
  Filled 2014-04-01 (×2): qty 100

## 2014-04-01 MED ORDER — POTASSIUM CHLORIDE CRYS ER 20 MEQ PO TBCR
40.0000 meq | EXTENDED_RELEASE_TABLET | Freq: Two times a day (BID) | ORAL | Status: DC
  Administered 2014-04-01 – 2014-04-02 (×3): 40 meq via ORAL
  Filled 2014-04-01 (×5): qty 2

## 2014-04-01 MED ORDER — LACTULOSE 10 GM/15ML PO SOLN
10.0000 g | Freq: Every day | ORAL | Status: DC | PRN
  Filled 2014-04-01: qty 15

## 2014-04-01 MED ORDER — CEFUROXIME AXETIL 500 MG PO TABS
500.0000 mg | ORAL_TABLET | Freq: Two times a day (BID) | ORAL | Status: DC
  Administered 2014-04-01 – 2014-04-02 (×2): 500 mg via ORAL
  Filled 2014-04-01 (×4): qty 1

## 2014-04-01 MED ORDER — PREDNISONE 20 MG PO TABS
20.0000 mg | ORAL_TABLET | Freq: Every day | ORAL | Status: DC
  Filled 2014-04-01 (×2): qty 1

## 2014-04-01 NOTE — Progress Notes (Signed)
PROGRESS NOTE  [name] XKG:[id number] DOB: 08-Oct-1945 DOA: 03/30/2014 PCP: Walker Kehr, MD  Chart reviewed.  Assessment/Plan: UTI- cx negative. Change to po abx.  Hypotension: none further  DM- uncontrolled, SSI, home meds,  HgbA1C 9.7 Better. Continue current  hypokalemia- replete. Resume spironilactone  hypomagnesium- recheck in am  Thrombocytopenia- no heparin/lovonox, trend- has cirrhosis   Morbid obesity   Fall- PT eval. Likely home tomorrow if stable. Per RN, resistant to ambulating.   Fibromyalgia: will decrease gabapentin to 200 tid due to fall. Was on 800 qid per med rec.  Crohn's colitis- had been on 20 mg pred. To taper per gi note. Will decrease to 20 mg  Diarrhea- c diff negative  LE edema -duplex negative. Resume diuretics  Code Status: full Family Communication: patient Disposition Plan: home tomorrow if stable   Consultants:    Procedures:  none    HPI/Subjective: C/o pain upper abdomen and right side x6 months. Continuous. Was told it was fibromyalgia  Objective: Filed Vitals:   04/01/14 1315  BP: 120/71  Pulse: 94  Temp: 98.4 F (36.9 C)  Resp: 16    Intake/Output Summary (Last 24 hours) at 04/01/14 1351 Last data filed at 04/01/14 0925  Gross per 24 hour  Intake    800 ml  Output    250 ml  Net    550 ml   Filed Weights   03/30/14 1550 03/31/14 1238  Weight: 85.8 kg (189 lb 2.5 oz) 87.3 kg (192 lb 7.4 oz)    Exam:   General:  A+Ox3, NAD. Sitting up, eating lunch  Cardiovascular: rrr without MGR  Respiratory: clear, no wheezing  Abdomen: +BS, soft, nontender  Musculoskeletal: +LE, edema   Data Reviewed: Basic Metabolic Panel:  Recent Labs Lab 03/30/14 0900 03/30/14 1330 03/30/14 1942 03/31/14 0235 04/01/14 0153  NA 137  --  139 139 141  K 2.3*  --  3.0* 3.2* 3.2*  CL 100  --  103 105 105  CO2 23  --  23 25 23   GLUCOSE 200*  --  266* 254* 166*  BUN 8  --  10 10 9   CREATININE 0.79  --  0.82  0.85 0.80  CALCIUM 8.7  --  8.1* 8.1* 8.0*  MG  --  1.4*  --   --   --    Liver Function Tests: No results found for this basename: AST, ALT, ALKPHOS, BILITOT, PROT, ALBUMIN,  in the last 168 hours No results found for this basename: LIPASE, AMYLASE,  in the last 168 hours No results found for this basename: AMMONIA,  in the last 168 hours CBC:  Recent Labs Lab 03/30/14 0900 03/31/14 0235 04/01/14 0153  WBC 6.5 3.5* 3.1*  HGB 10.7* 9.9* 10.8*  HCT 30.5* 29.1* 31.9*  MCV 90.0 91.8 92.7  PLT 20* 17* 18*   Cardiac Enzymes: No results found for this basename: CKTOTAL, CKMB, CKMBINDEX, TROPONINI,  in the last 168 hours BNP (last 3 results)  Recent Labs  10/29/13 0631  PROBNP 200.8*   CBG:  Recent Labs Lab 03/30/14 2135 03/31/14 1650 03/31/14 2148 04/01/14 0754 04/01/14 1204  GLUCAP 262* 128* 171* 175* 92    Recent Results (from the past 240 hour(s))  URINE CULTURE     Status: None   Collection Time    03/30/14  9:45 AM      Result Value Ref Range Status   Specimen Description URINE, CATHETERIZED   Final   Special Requests Immunocompromised  Final   Culture  Setup Time     Final   Value: 03/30/2014 15:56     Performed at [place]     Final   Value: NO GROWTH     Performed at [facility]   Culture     Final   Value: NO GROWTH     Performed at [facility]   Report Status 03/31/2014 FINAL   Final  CULTURE, BLOOD (ROUTINE X 2)     Status: None   Collection Time    03/30/14  1:21 PM      Result Value Ref Range Status   Specimen Description BLOOD LEFT HAND   Final   Special Requests BOTTLES DRAWN AEROBIC AND ANAEROBIC 5CC   Final   Culture  Setup Time     Final   Value: 03/30/2014 15:54     Performed at [facility]   Culture     Final   Value:        BLOOD CULTURE RECEIVED NO GROWTH TO DATE CULTURE WILL BE HELD FOR 5 DAYS BEFORE ISSUING A FINAL NEGATIVE REPORT     Performed at [facility]    Report Status PENDING   Incomplete  CULTURE, BLOOD (ROUTINE X 2)     Status: None   Collection Time    03/30/14  1:30 PM      Result Value Ref Range Status   Specimen Description BLOOD RIGHT HAND   Final   Special Requests BOTTLES DRAWN AEROBIC AND ANAEROBIC 5CC   Final   Culture  Setup Time     Final   Value: 03/30/2014 15:54     Performed at [facility]   Culture     Final   Value:        BLOOD CULTURE RECEIVED NO GROWTH TO DATE CULTURE WILL BE HELD FOR 5 DAYS BEFORE ISSUING A FINAL NEGATIVE REPORT     Performed at [facility]   Report Status PENDING   Incomplete  MRSA PCR SCREENING     Status: None   Collection Time    03/30/14  3:10 PM      Result Value Ref Range Status   MRSA by PCR NEGATIVE  NEGATIVE Final   Comment:            The GeneXpert MRSA Assay (FDA     approved for NASAL specimens     only), is one component of a     comprehensive MRSA colonization     surveillance program. It is not     intended to diagnose MRSA     infection nor to guide or     monitor treatment for     MRSA infections.  CLOSTRIDIUM DIFFICILE BY PCR     Status: None   Collection Time    03/30/14  9:00 PM      Result Value Ref Range Status   C difficile by pcr NEGATIVE  NEGATIVE Final     Studies: No results found.  Scheduled Meds: . buPROPion  150 mg Oral Daily  . cefTRIAXone (ROCEPHIN)  IV  1 g Intravenous Q24H  . citalopram  10 mg Oral Daily  . insulin aspart  0-15 Units Subcutaneous TID WC  . insulin aspart  0-5 Units Subcutaneous QHS  . insulin aspart  10 Units Subcutaneous TID WC  . insulin glargine  10 Units Subcutaneous Daily  . potassium chloride  10 mEq  Oral BID  . predniSONE  40 mg Oral Q breakfast   Continuous Infusions:   Antibiotics Given (last 72 hours)   Date/Time Action Medication Dose Rate   03/30/14 1627 Given   cefTRIAXone (ROCEPHIN) 1 g in dextrose 5 % 50 mL IVPB 1 g 100 mL/hr   03/31/14 1617 Given   cefTRIAXone (ROCEPHIN) 1 g in dextrose  5 % 50 mL IVPB 1 g 100 mL/hr     Time spent: 35 min  [facility] Pager [phone]. If 7PM-7AM, please contact night-coverage at [email or web address], password [facility] 04/01/2014, 1:51 PM  LOS: 2 days

## 2014-04-02 ENCOUNTER — Encounter: Payer: Self-pay | Admitting: Internal Medicine

## 2014-04-02 LAB — GLUCOSE, CAPILLARY
GLUCOSE-CAPILLARY: 235 mg/dL — AB (ref 70–99)
GLUCOSE-CAPILLARY: 280 mg/dL — AB (ref 70–99)
GLUCOSE-CAPILLARY: 108 mg/dL — AB (ref 70–99)
GLUCOSE-CAPILLARY: 169 mg/dL — AB (ref 70–99)
Glucose-Capillary: 149 mg/dL — ABNORMAL HIGH (ref 70–99)

## 2014-04-02 LAB — BASIC METABOLIC PANEL
BUN: 9 mg/dL (ref 6–23)
CALCIUM: 7.9 mg/dL — AB (ref 8.4–10.5)
CO2: 24 meq/L (ref 19–32)
CREATININE: 0.8 mg/dL (ref 0.50–1.10)
Chloride: 106 mEq/L (ref 96–112)
GFR calc Af Amer: 87 mL/min — ABNORMAL LOW (ref >90)
GFR calc non Af Amer: 75 mL/min — ABNORMAL LOW (ref >90)
Glucose, Bld: 106 mg/dL — ABNORMAL HIGH (ref 70–99)
Potassium: 3.8 mEq/L (ref 3.7–5.3)
Sodium: 142 mEq/L (ref 137–147)

## 2014-04-02 LAB — MAGNESIUM: Magnesium: 1.3 mg/dL — ABNORMAL LOW (ref 1.5–2.5)

## 2014-04-02 MED ORDER — PREDNISONE 20 MG PO TABS: 20.0000 mg | ORAL_TABLET | Freq: Every day | ORAL | Status: DC

## 2014-04-02 MED ORDER — CEFUROXIME AXETIL 500 MG PO TABS: 500.0000 mg | ORAL_TABLET | Freq: Two times a day (BID) | ORAL | Status: DC

## 2014-04-02 MED ORDER — LOPERAMIDE HCL 2 MG PO CAPS
4.0000 mg | ORAL_CAPSULE | Freq: Once | ORAL | Status: AC
  Administered 2014-04-02: 4 mg via ORAL
  Filled 2014-04-02: qty 2

## 2014-04-02 MED ORDER — GABAPENTIN 800 MG PO TABS: 400.0000 mg | ORAL_TABLET | Freq: Two times a day (BID) | ORAL | Status: DC

## 2014-04-02 NOTE — Progress Notes (Signed)
PT Note Evaluation completed and to be placed in chart.  Pt appears at baseline level.  Has equipment at home.  No f/u home health needs at this time.  Thanks and will sign off. [facility] [phone] (pager)

## 2014-04-02 NOTE — Discharge Instructions (Signed)
Diarrhea Diarrhea is watery poop (stool). It can make you feel weak, tired, thirsty, or give you a dry mouth (signs of dehydration). Watery poop is a sign of another problem, most often an infection. It often lasts 2-3 days. It can last longer if it is a sign of something serious. Take care of yourself as told by your doctor. HOME CARE   Drink 1 cup (8 ounces) of fluid each time you have watery poop.  Do not drink the following fluids:  Those that contain simple sugars (fructose, glucose, galactose, lactose, sucrose, maltose).  Sports drinks.  Fruit juices.  Whole milk products.  Sodas.  Drinks with caffeine (coffee, tea, soda) or alcohol.  Oral rehydration solution may be used if the doctor says it is okay. You may make your own solution. Follow this recipe:   - teaspoon table salt.   teaspoon baking soda.   teaspoon salt substitute containing potassium chloride.  1 tablespoons sugar.  1 liter (34 ounces) of water.  Avoid the following foods:  High fiber foods, such as raw fruits and vegetables.  Nuts, seeds, and whole grain breads and cereals.   Those that are sweetened with sugar alcohols (xylitol, sorbitol, mannitol).  Try eating the following foods:  Starchy foods, such as rice, toast, pasta, low-sugar cereal, oatmeal, baked potatoes, crackers, and bagels.  Bananas.  Applesauce.  Eat probiotic-rich foods, such as yogurt and milk products that are fermented.  Wash your hands well after each time you have watery poop.  Only take medicine as told by your doctor.  Take a warm bath to help lessen burning or pain from having watery poop. GET HELP RIGHT AWAY IF:   You cannot drink fluids without throwing up (vomiting).  You keep throwing up.  You have blood in your poop, or your poop looks black and tarry.  You do not pee (urinate) in 6-8 hours, or there is only a small amount of very dark pee.  You have belly (abdominal) pain that gets worse or stays  in the same spot (localizes).  You are weak, dizzy, confused, or lightheaded.  You have a very bad headache.  Your watery poop gets worse or does not get better.  You have a fever or lasting symptoms for more than 2-3 days.  You have a fever and your symptoms suddenly get worse. MAKE SURE YOU:   Understand these instructions.  Will watch your condition.  Will get help right away if you are not doing well or get worse. Document Released: 03/09/2008 Document Revised: 06/15/2012 Document Reviewed: 05/29/2012 [facility] Patient Information 2015 [place], [place]. This information is not intended to replace advice given to you by your health care provider. Make sure you discuss any questions you have with your health care provider.

## 2014-04-02 NOTE — Progress Notes (Signed)
Pt given discharge instructions and prescriptions.  PIV removed.  Pt taken to discharge location via wheelchair. 

## 2014-04-02 NOTE — Evaluation (Signed)
Physical Therapy Evaluation and D/C Patient Details Name: [name] MRN: [id number] DOB: 1946-06-05 Today's Date: 04/02/2014   History of Present Illness  Pt admit with UTI and falls.    Clinical Impression  Pt admitted with above. Pt currently without functional limitations as she is Modif I with RW.  Pt will not need skilled PT either in hospital or at home.  Will sign off as pt is at baseline.  Thanks.  Follow Up Recommendations No PT follow up    Equipment Recommendations  None recommended by PT    Recommendations for Other Services       Precautions / Restrictions Precautions Precautions: Fall Restrictions Weight Bearing Restrictions: No      Mobility  Bed Mobility Overal bed mobility: Independent                Transfers Overall transfer level: Independent                  Ambulation/Gait Ambulation/Gait assistance: Modified independent (Device/Increase time) Ambulation Distance (Feet): 150 Feet Assistive device: Rolling walker (2 wheeled) Gait Pattern/deviations: Decreased stride length;Step-through pattern   Gait velocity interpretation: Below normal speed for age/gender General Gait Details: Pt ambulated with RW with no need for cues as she is safe with RW.  No LOB with min challenges to balance.  This PT had just walked outside the room and heard the alarm go off and went back in and pt was walking with her RW to the bathroom and had urinated.  Pt safely got onto toilet and was able to take her socks off and clean herself independently.  Pt reports this happens to her at home and she is able to deal with it without problems.  Pt then ambulated back to bed and got into bed independently.      Stairs            Wheelchair Mobility    Modified Rankin (Stroke Patients Only)       Balance Overall balance assessment: Modified Independent                                           Pertinent Vitals/Pain VSS, No pain     Home Living Family/patient expects to be discharged to:: Private residence Living Arrangements: Spouse/significant other Available Help at Discharge: Family;Available PRN/intermittently (Husband works Mon, Wed and Fri 5-6 hours day) Type of Home: House Home Access: Stairs to enter Entrance Stairs-Rails: Right Technical brewer of Steps: 5 Home Layout: One level Home Equipment: Cane - single point;Walker - 2 wheels;Wheelchair - manual;Shower seat;Grab bars - tub/shower;Bedside commode;Hand held shower head      Prior Function Level of Independence: Independent with assistive device(s)         Comments: had been using walker at all times, B/D self     Hand Dominance   Dominant Hand: Right    Extremity/Trunk Assessment   Upper Extremity Assessment: Defer to OT evaluation           Lower Extremity Assessment: Generalized weakness         Communication   Communication: No difficulties;HOH  Cognition Arousal/Alertness: Awake/alert Behavior During Therapy: WFL for tasks assessed/performed Overall Cognitive Status: Within Functional Limits for tasks assessed                      General Comments  Exercises        Assessment/Plan    PT Assessment Patient needs continued PT services  PT Diagnosis Generalized weakness   PT Problem List Decreased activity tolerance;Decreased balance;Decreased mobility;Decreased knowledge of use of DME;Decreased safety awareness;Decreased knowledge of precautions  PT Treatment Interventions DME instruction;Gait training;Functional mobility training;Therapeutic activities;Therapeutic exercise;Patient/family education;Balance training   PT Goals (Current goals can be found in the Care Plan section) Acute Rehab PT Goals Patient Stated Goal: to go home    Frequency Min 3X/week   Barriers to discharge Decreased caregiver support Husband works some days.      Co-evaluation               End of Session  Equipment Utilized During Treatment: Gait belt Activity Tolerance: Patient tolerated treatment well Patient left: in bed;with call bell/phone within reach;with bed alarm set Nurse Communication: Mobility status         Time: 8832-5498 PT Time Calculation (min): 23 min   Charges:   PT Evaluation $Initial PT Evaluation Tier I: 1 Procedure PT Treatments $Gait Training: 8-22 mins   PT G Codes:          INGOLD,DAWN 04/06/2014, 2:54 PM [facility] [phone] (pager)

## 2014-04-02 NOTE — Discharge Summary (Signed)
Physician Discharge Summary  [name] KDX:[id number] DOB: Jun 22, 1946 DOA: 03/30/2014  PCP: Walker Kehr, MD  Admit date: 03/30/2014 Discharge date: 04/02/2014  Time spent: greater than 30 minutes  Recommendations for Outpatient Follow-up:  1. Prednisone taper per gi recs 2. Optimize diabetes control  Discharge Diagnoses:  Principal Problem:   UTI (lower urinary tract infection) Active Problems:   DIABETES MELLITUS, TYPE II, uncontrolled hypomagnesemia   Morbid obesity   THROMBOCYTOPENIA   CIRRHOSIS fall Fibromyalgia obesity Crohn's colitis Diarrhea, ?chronic?: c diff negativel Peripheral edema  Discharge Condition: stable  Filed Weights   03/30/14 1550 03/31/14 1238 04/02/14 0350  Weight: 85.8 kg (189 lb 2.5 oz) 87.3 kg (192 lb 7.4 oz) 83.8 kg (184 lb 11.9 oz)    History of present illness:  68 y.o. female  Who had a colonoscopy a few days ago after she was admitted in May with ileitis and ABLA- Crohn's flare.  Comes back to the ER after losing her balance and falling x 2. She hit her head, denies LOC. She admits to few days of fever and chills. Denies dysuria No neck stiffness. Patient is hard of hear and no family is present  She also describes LE edema that has worsened since her last admission.  In the Er, she was febrile, had what appears to be a UTI. Her K was found to be low. CT scan of head shows hematoma. Await Mg and lactic acid- will place in SDU as BP has been falling during ER course.   Hospital Course:  Admitted to stepdown, started on ceftriaxone.  diruetics held due to borderline blood pressures.  UTI- cx negative. Changed to po abx. Will give 4 more doses ceftin. Fevers and blood pressure stabilized  Hypotension, relative: none further after fluid rescusitation  DM- uncontrolled, SSI, home meds, HgbA1C 9.7. Needs outpatient follow up  hypokalemia- repleted. Resumed spironilactone   hypomagnesium- corrected  Thrombocytopenia, chronic, from  cirrhosis- no heparin/lovonox. No bleeding noted  Morbid obesity   Fall- PT evaluated. Has equipment at home. At baseline by discharge.  Decreased gabapentin dose to hopefully decrease likelihood of fall/balance problems  Fibromyalgia: see above.  Complained of various chronic pains during hospitalization.  Discussed with pt the possibility of pain management referral.  She declines.  Crohn's colitis- had been on 20 mg pred. To taper per gi note.   Diarrhea- c diff negative   LE edema  -duplex negative. Diuretics resumed  Procedures:  none  Consultations:  none  Discharge Exam: Filed Vitals:   04/02/14 1254  BP: 132/80  Pulse: 104  Temp: 98.5 F (36.9 C)  Resp: 16    General: walking hall with PT and walker. A, o, comfortable Cardiovascular: RRR Respiratory: CTA abd S, NT, obese Ext 1+ edema    Medication List         ACCU-CHEK COMPACT TEST DRUM test strip  Generic drug:  glucose blood  USE 4 TIMES A DAY AS DIRECTED     glucose blood test strip  Commonly known as:  FREESTYLE LITE  Use as directed to check blood sugar twice daily dx 250.00     B-D UF III MINI PEN NEEDLES 31G X 5 MM Misc  Generic drug:  Insulin Pen Needle  as directed.     buPROPion 150 MG 12 hr tablet  Commonly known as:  WELLBUTRIN SR  Take 150 mg by mouth daily.     cefUROXime 500 MG tablet  Commonly known as:  CEFTIN  Take 1  tablet (500 mg total) by mouth 2 (two) times daily with a meal.     cholecalciferol 1000 UNITS tablet  Commonly known as:  VITAMIN D  Take 1,000 Units by mouth daily.     citalopram 10 MG tablet  Commonly known as:  CELEXA  Take 1 tablet (10 mg total) by mouth daily.     freestyle lancets  1 each by Other route 2 (two) times daily as needed. Use as instructed     gabapentin 800 MG tablet  Commonly known as:  NEURONTIN  Take 0.5 tablets (400 mg total) by mouth 2 (two) times daily.     HUMALOG KWIKPEN 100 UNIT/ML KiwkPen  Generic drug:  insulin lispro   Inject 22 Units into the skin 4 (four) times daily.     lactulose 10 GM/15ML solution  Commonly known as:  CHRONULAC  Take 10 g by mouth daily as needed for mild constipation.     predniSONE 20 MG tablet  Commonly known as:  DELTASONE  Take 1 tablet (20 mg total) by mouth daily with breakfast.     spironolactone 100 MG tablet  Commonly known as:  ALDACTONE  Take 100 mg by mouth daily.     torsemide 20 MG tablet  Commonly known as:  DEMADEX  Take 20 mg by mouth daily.       Allergies  Allergen Reactions  . Amoxicillin-Pot Clavulanate Diarrhea  . Ciprofloxacin Swelling    swelling of LE  . Duragesic Disc Transdermal System [Alcohol-Fentanyl] Itching    Itching from patch  . Metformin Diarrhea  . Sulfamethoxazole-Trimethoprim Swelling     leg edema  . Tetracycline Hcl Itching  . Tramadol Other (See Comments)    Headache  . Azithromycin Rash       Follow-up Information   Follow up with Walker Kehr, MD. (as scheduled)    Specialty:  Internal Medicine   Contact information:   [place] [address] [phone]        The results of significant diagnostics from this hospitalization (including imaging, microbiology, ancillary and laboratory) are listed below for reference.    Significant Diagnostic Studies: Ct Head Wo Contrast  03/30/2014   CLINICAL DATA:  Frequent falls, history cirrhosis, diabetes, essential hypertension  EXAM: CT HEAD WITHOUT CONTRAST  TECHNIQUE: Contiguous axial images were obtained from the base of the skull through the vertex without intravenous contrast.  COMPARISON:  06/03/2010  FINDINGS: Generalized atrophy.  Normal ventricular morphology.  No midline shift or mass effect.  Small vessel chronic ischemic changes of deep cerebral white matter.  No intracranial hemorrhage, mass lesion, or acute infarction.  Visualized paranasal sinuses and mastoid air cells clear.  Bones unremarkable.  Atherosclerotic calcifications at skullbase.   LEFT temporo-occipital scalp hematoma.  IMPRESSION:LEFT TEMPORO-OCCIPITAL SCALP HEMATOMA.: IMPRESSION:LEFT TEMPORO-OCCIPITAL SCALP HEMATOMA. Atrophy with small vessel chronic ischemic changes of deep cerebral white matter.  No acute intracranial abnormalities.  LEFT temporo-occipital scalp hematoma.   Electronically Signed   By: Lavonia Dana M.D.   On: 03/30/2014 10:59   Dg Chest Port 1 View  03/30/2014   CLINICAL DATA:  Shortness of breath, weakness  EXAM: PORTABLE CHEST - 1 VIEW  COMPARISON:  Portable exam 0901 hr compared to 02/23/2014  FINDINGS: Enlargement of cardiac silhouette with mitral annular calcification.  Mediastinal contours and pulmonary vascularity normal.  Mild bibasilar atelectasis.  No acute failure or consolidation.  No pleural effusion or pneumothorax.  Bones unremarkable.  IMPRESSION: Enlargement of cardiac silhouette.  Mild bibasilar atelectasis.   Electronically Signed   By: Lavonia Dana M.D.   On: 03/30/2014 09:07    Microbiology: Recent Results (from the past 240 hour(s))  URINE CULTURE     Status: None   Collection Time    03/30/14  9:45 AM      Result Value Ref Range Status   Specimen Description URINE, CATHETERIZED   Final   Special Requests Immunocompromised   Final   Culture  Setup Time     Final   Value: 03/30/2014 15:56     Performed at [facility]     Final   Value: NO GROWTH     Performed at [facility]   Culture     Final   Value: NO GROWTH     Performed at [facility]   Report Status 03/31/2014 FINAL   Final  CULTURE, BLOOD (ROUTINE X 2)     Status: None   Collection Time    03/30/14  1:21 PM      Result Value Ref Range Status   Specimen Description BLOOD LEFT HAND   Final   Special Requests BOTTLES DRAWN AEROBIC AND ANAEROBIC 5CC   Final   Culture  Setup Time     Final   Value: 03/30/2014 15:54     Performed at [facility]   Culture     Final   Value:        BLOOD CULTURE RECEIVED NO GROWTH TO  DATE CULTURE WILL BE HELD FOR 5 DAYS BEFORE ISSUING A FINAL NEGATIVE REPORT     Performed at [facility]   Report Status PENDING   Incomplete  CULTURE, BLOOD (ROUTINE X 2)     Status: None   Collection Time    03/30/14  1:30 PM      Result Value Ref Range Status   Specimen Description BLOOD RIGHT HAND   Final   Special Requests BOTTLES DRAWN AEROBIC AND ANAEROBIC 5CC   Final   Culture  Setup Time     Final   Value: 03/30/2014 15:54     Performed at [facility]   Culture     Final   Value:        BLOOD CULTURE RECEIVED NO GROWTH TO DATE CULTURE WILL BE HELD FOR 5 DAYS BEFORE ISSUING A FINAL NEGATIVE REPORT     Performed at [facility]   Report Status PENDING   Incomplete  MRSA PCR SCREENING     Status: None   Collection Time    03/30/14  3:10 PM      Result Value Ref Range Status   MRSA by PCR NEGATIVE  NEGATIVE Final   Comment:            The GeneXpert MRSA Assay (FDA     approved for NASAL specimens     only), is one component of a     comprehensive MRSA colonization     surveillance program. It is not     intended to diagnose MRSA     infection nor to guide or     monitor treatment for     MRSA infections.  CLOSTRIDIUM DIFFICILE BY PCR     Status: None   Collection Time    03/30/14  9:00 PM      Result Value Ref Range Status   C difficile by pcr NEGATIVE  NEGATIVE Final     Labs: Basic  Metabolic Panel:  Recent Labs Lab 03/30/14 0900 03/30/14 1330 03/30/14 1942 03/31/14 0235 04/01/14 0153 04/02/14 0550  NA 137  --  139 139 141 142  K 2.3*  --  3.0* 3.2* 3.2* 3.8  CL 100  --  103 105 105 106  CO2 23  --  23 25 23 24   GLUCOSE 200*  --  266* 254* 166* 106*  BUN 8  --  10 10 9 9   CREATININE 0.79  --  0.82 0.85 0.80 0.80  CALCIUM 8.7  --  8.1* 8.1* 8.0* 7.9*  MG  --  1.4*  --   --   --  1.3*   Liver Function Tests: No results found for this basename: AST, ALT, ALKPHOS, BILITOT, PROT, ALBUMIN,  in the last 168 hours No results found  for this basename: LIPASE, AMYLASE,  in the last 168 hours No results found for this basename: AMMONIA,  in the last 168 hours CBC:  Recent Labs Lab 03/30/14 0900 03/31/14 0235 04/01/14 0153  WBC 6.5 3.5* 3.1*  HGB 10.7* 9.9* 10.8*  HCT 30.5* 29.1* 31.9*  MCV 90.0 91.8 92.7  PLT 20* 17* 18*   Cardiac Enzymes: No results found for this basename: CKTOTAL, CKMB, CKMBINDEX, TROPONINI,  in the last 168 hours BNP: BNP (last 3 results)  Recent Labs  10/29/13 0631  PROBNP 200.8*   CBG:  Recent Labs Lab 04/01/14 1204 04/01/14 1702 04/01/14 2145 04/02/14 0752 04/02/14 1157  GLUCAP 92 170* 111* 108* 169*       Signed:  SULLIVAN,CORINNA L  [facility] 04/02/2014, 1:09 PM

## 2014-04-03 ENCOUNTER — Ambulatory Visit (INDEPENDENT_AMBULATORY_CARE_PROVIDER_SITE_OTHER): Payer: Medicare PPO | Admitting: Internal Medicine

## 2014-04-03 ENCOUNTER — Encounter: Payer: Self-pay | Admitting: Internal Medicine

## 2014-04-03 VITALS — BP 130/70 | HR 80 | Temp 98.5°F | Resp 16 | Wt 182.0 lb

## 2014-04-03 DIAGNOSIS — S0093XA Contusion of unspecified part of head, initial encounter: Secondary | ICD-10-CM | POA: Insufficient documentation

## 2014-04-03 DIAGNOSIS — E119 Type 2 diabetes mellitus without complications: Secondary | ICD-10-CM

## 2014-04-03 DIAGNOSIS — S0093XS Contusion of unspecified part of head, sequela: Secondary | ICD-10-CM

## 2014-04-03 DIAGNOSIS — M545 Low back pain, unspecified: Secondary | ICD-10-CM

## 2014-04-03 DIAGNOSIS — K7460 Unspecified cirrhosis of liver: Secondary | ICD-10-CM

## 2014-04-03 MED ORDER — MAGNESIUM GLUCONATE 30 MG PO TABS: 30.0000 mg | ORAL_TABLET | Freq: Two times a day (BID) | ORAL | Status: DC

## 2014-04-03 NOTE — Assessment & Plan Note (Signed)
Continue with current prescription therapy as reflected on the Med list.  

## 2014-04-03 NOTE — Assessment & Plan Note (Signed)
Chronic Continue with current prn prescription therapy as reflected on the Med list.

## 2014-04-03 NOTE — Progress Notes (Signed)
Pre visit review using our clinic review tool, if applicable. No additional management support is needed unless otherwise documented below in the visit note. 

## 2014-04-03 NOTE — Assessment & Plan Note (Signed)
PO Magnesium

## 2014-04-03 NOTE — Assessment & Plan Note (Signed)
L occip area 6/15 Will watch

## 2014-04-03 NOTE — Progress Notes (Signed)
Subjective:  F/u hosp stay:  [name] came back to the ER on Sat after losing her balance and falling x 2. She hit her head, denies LOC. She admits to few days of fever and chills. In the Er, she was febrile, had what appears to be a UTI. Her K was found to be low. CT scan of head shows hematoma.  She went home on Monday     HPI   F/u R foot injury:  heater fell on it 1 month ago - not getting any better. C/o pain and swelling of th top of R foot, still bruised. Broken plate - no need for surgery (1/15)  F/u B leg cramps R>L ...   The patient presents for a follow-up of chronic cirrhosis, hypertension, chronic dyslipidemia, type 2 diabetes controlled with medicines.     Wt Readings from Last 3 Encounters:  04/03/14 182 lb (82.555 kg)  04/02/14 184 lb 11.9 oz (83.8 kg)  03/28/14 227 lb (102.967 kg)   BP Readings from Last 3 Encounters:  04/03/14 130/70  04/02/14 132/80  03/28/14 143/81        Review of Systems  Constitutional: Positive for fatigue. Negative for chills, activity change, appetite change and unexpected weight change.  HENT: Negative for mouth sores.   Eyes: Negative for visual disturbance.  Respiratory: Negative for chest tightness.   Genitourinary: Negative for frequency, difficulty urinating and vaginal pain.  Musculoskeletal: Positive for arthralgias and gait problem.  Skin: Negative for pallor and rash.  Neurological: Positive for light-headedness. Negative for tremors.  Hematological: Negative for adenopathy. Bruises/bleeds easily.  Psychiatric/Behavioral: Positive for decreased concentration. Negative for suicidal ideas, confusion and sleep disturbance. The patient is nervous/anxious.        Objective:   Physical Exam  Constitutional: She appears well-developed. No distress.  obese  HENT:  Head: Normocephalic.  Right Ear: External ear normal.  Left Ear: External ear normal.  Nose: Nose normal.  Mouth/Throat: Oropharynx is clear and  moist.  Eyes: Conjunctivae are normal. Pupils are equal, round, and reactive to light. Right eye exhibits no discharge. Left eye exhibits no discharge.  Neck: Normal range of motion. Neck supple. No JVD present. No tracheal deviation present. No thyromegaly present.  Cardiovascular: Normal rate, regular rhythm and normal heart sounds.   Pulmonary/Chest: No stridor. No respiratory distress. She has no wheezes.  Abdominal: Soft. Bowel sounds are normal. She exhibits no distension and no mass. There is no tenderness. There is no rebound and no guarding.  Musculoskeletal: She exhibits edema (trace L; 1+ R) and tenderness (B knees: R>L calf).  Lymphadenopathy:    She has no cervical adenopathy.  Neurological: She displays normal reflexes. No cranial nerve deficit. She exhibits normal muscle tone. Coordination abnormal.  Skin: No rash noted. No erythema. No pallor.  Psychiatric: She has a normal mood and affect. Her behavior is normal. Judgment and thought content normal.  No dry blood in nares R foot is ok Bruises from IVs L neck, occip area bruising Walker  Lab Results  Component Value Date   WBC 3.1* 04/01/2014   HGB 10.8* 04/01/2014   HCT 31.9* 04/01/2014   PLT 18* 04/01/2014   GLUCOSE 106* 04/02/2014   CHOL 159 06/13/2009   TRIG 37.0 06/13/2009   HDL 66.50 06/13/2009   LDLCALC 85 06/13/2009   ALT 35 03/06/2014   AST 38* 03/06/2014   NA 142 04/02/2014   K 3.8 04/02/2014   CL 106 04/02/2014   CREATININE 0.80 04/02/2014  BUN 9 04/02/2014   CO2 24 04/02/2014   TSH 0.564 03/30/2014   INR 1.5* 03/06/2014   HGBA1C 9.7* 03/30/2014     a complex case - multi-organ chronic failure     Assessment & Plan:

## 2014-04-04 ENCOUNTER — Telehealth: Payer: Self-pay | Admitting: *Deleted

## 2014-04-04 DIAGNOSIS — E119 Type 2 diabetes mellitus without complications: Secondary | ICD-10-CM

## 2014-04-04 NOTE — Telephone Encounter (Signed)
Attempted to call patient. Unable to leave a message.  Lipid panel ordered.

## 2014-04-05 ENCOUNTER — Telehealth: Payer: Self-pay | Admitting: *Deleted

## 2014-04-05 LAB — CULTURE, BLOOD (ROUTINE X 2)
Culture: NO GROWTH
Culture: NO GROWTH

## 2014-04-05 NOTE — Telephone Encounter (Signed)
Magnesium Gluconate 550 mg is not available. Can we send in new Rx for something else? No alternatives given. Please advise.

## 2014-04-06 ENCOUNTER — Other Ambulatory Visit: Payer: Self-pay | Admitting: Internal Medicine

## 2014-04-06 MED ORDER — MAGNESIUM 30 MG PO TABS: 30.0000 mg | ORAL_TABLET | Freq: Two times a day (BID) | ORAL | Status: DC

## 2014-04-06 NOTE — Telephone Encounter (Signed)
Done. Thx.

## 2014-04-09 NOTE — Telephone Encounter (Signed)
Patient has questions regarding the magnesium that we sent to the pharmacy for her. She spelled the name of the medication, but I could not understand what she was spelling.

## 2014-04-10 NOTE — Telephone Encounter (Signed)
Called pt no answer unable to leave message

## 2014-04-15 ENCOUNTER — Other Ambulatory Visit: Payer: Self-pay | Admitting: Internal Medicine

## 2014-04-16 NOTE — Telephone Encounter (Signed)
Unable to leave a message.

## 2014-04-17 ENCOUNTER — Other Ambulatory Visit: Payer: Self-pay

## 2014-04-17 MED ORDER — GLUCOSE BLOOD VI STRP: ORAL_STRIP | Status: AC

## 2014-04-17 MED ORDER — ACCU-CHEK SOFT TOUCH LANCETS MISC: Status: AC

## 2014-04-19 ENCOUNTER — Telehealth: Payer: Self-pay | Admitting: *Deleted

## 2014-04-19 NOTE — Telephone Encounter (Signed)
Magnesium 30 mg is not available. Please advise.

## 2014-04-20 NOTE — Telephone Encounter (Signed)
What is available? THx

## 2014-04-23 MED ORDER — MAGNESIUM OXIDE 400 MG PO TABS: 400.0000 mg | ORAL_TABLET | Freq: Two times a day (BID) | ORAL | Status: AC

## 2014-04-23 NOTE — Telephone Encounter (Signed)
Ok - use bid Thx

## 2014-04-23 NOTE — Telephone Encounter (Signed)
Unsure, can this be purchased over the counter.

## 2014-04-23 NOTE — Telephone Encounter (Signed)
pls ask her pharmacy what they have Thx

## 2014-04-23 NOTE — Addendum Note (Signed)
Addended by: Cassandria Anger on: 04/23/2014 04:51 PM   Modules accepted: Orders, Medications

## 2014-04-23 NOTE — Telephone Encounter (Signed)
Per pharmacy only magnesium oxide 400 mg available. Thanks

## 2014-05-01 DIAGNOSIS — M545 Low back pain, unspecified: Secondary | ICD-10-CM

## 2014-05-01 DIAGNOSIS — E119 Type 2 diabetes mellitus without complications: Secondary | ICD-10-CM

## 2014-05-01 DIAGNOSIS — K7460 Unspecified cirrhosis of liver: Secondary | ICD-10-CM

## 2014-05-01 DIAGNOSIS — IMO0002 Reserved for concepts with insufficient information to code with codable children: Secondary | ICD-10-CM

## 2014-05-14 ENCOUNTER — Ambulatory Visit (HOSPITAL_COMMUNITY): Admission: RE | Admit: 2014-05-14 | Payer: Medicare PPO | Source: Ambulatory Visit | Admitting: Internal Medicine

## 2014-05-14 ENCOUNTER — Encounter (HOSPITAL_COMMUNITY): Admission: RE | Payer: Self-pay | Source: Ambulatory Visit

## 2014-05-14 SURGERY — COLONOSCOPY
Anesthesia: Monitor Anesthesia Care

## 2014-05-19 ENCOUNTER — Other Ambulatory Visit: Payer: Self-pay | Admitting: Internal Medicine

## 2014-05-22 ENCOUNTER — Ambulatory Visit: Payer: Medicare PPO | Admitting: Internal Medicine

## 2014-05-25 ENCOUNTER — Ambulatory Visit: Payer: Medicare PPO | Admitting: Internal Medicine

## 2014-05-30 ENCOUNTER — Telehealth: Payer: Self-pay | Admitting: Internal Medicine

## 2014-05-30 NOTE — Telephone Encounter (Signed)
Sister would like to talk to you about [name] before her appt in the morning.  Is requesting call back as soon as possible.

## 2014-05-30 NOTE — Telephone Encounter (Signed)
I called pt's Sister-- she states pt has been staying with her in [place] for 5 weeks. She feels that patient is not safe staying home alone any longer. Mr. Zandi is working and at times patient is disoriented and unresponsive. While she was in [place] she was having rectal bleeding. She states she is not eating right and she is not taking her meds properly. She feels Mrs. Braunschweig  needs advisement on taking care of herself and depression and possibly moving to an assisted living facilty.  Ms. Rozetta Nunnery states she will be available to speak to tomorrow if MD wishes.

## 2014-05-31 ENCOUNTER — Ambulatory Visit (INDEPENDENT_AMBULATORY_CARE_PROVIDER_SITE_OTHER): Payer: Medicare PPO | Admitting: Internal Medicine

## 2014-05-31 ENCOUNTER — Telehealth: Payer: Self-pay | Admitting: *Deleted

## 2014-05-31 ENCOUNTER — Encounter: Payer: Self-pay | Admitting: Internal Medicine

## 2014-05-31 ENCOUNTER — Other Ambulatory Visit (INDEPENDENT_AMBULATORY_CARE_PROVIDER_SITE_OTHER): Payer: Medicare PPO

## 2014-05-31 VITALS — BP 120/68 | HR 88 | Temp 98.3°F | Resp 16 | Wt 186.0 lb

## 2014-05-31 DIAGNOSIS — N390 Urinary tract infection, site not specified: Secondary | ICD-10-CM

## 2014-05-31 DIAGNOSIS — Z9119 Patient's noncompliance with other medical treatment and regimen: Secondary | ICD-10-CM | POA: Insufficient documentation

## 2014-05-31 DIAGNOSIS — E119 Type 2 diabetes mellitus without complications: Secondary | ICD-10-CM

## 2014-05-31 DIAGNOSIS — D696 Thrombocytopenia, unspecified: Secondary | ICD-10-CM

## 2014-05-31 DIAGNOSIS — K5010 Crohn's disease of large intestine without complications: Secondary | ICD-10-CM

## 2014-05-31 DIAGNOSIS — E876 Hypokalemia: Secondary | ICD-10-CM

## 2014-05-31 DIAGNOSIS — M545 Low back pain, unspecified: Secondary | ICD-10-CM

## 2014-05-31 DIAGNOSIS — Z91199 Patient's noncompliance with other medical treatment and regimen due to unspecified reason: Secondary | ICD-10-CM

## 2014-05-31 DIAGNOSIS — I10 Essential (primary) hypertension: Secondary | ICD-10-CM

## 2014-05-31 DIAGNOSIS — D509 Iron deficiency anemia, unspecified: Secondary | ICD-10-CM

## 2014-05-31 DIAGNOSIS — R609 Edema, unspecified: Secondary | ICD-10-CM

## 2014-05-31 DIAGNOSIS — K7290 Hepatic failure, unspecified without coma: Secondary | ICD-10-CM

## 2014-05-31 DIAGNOSIS — K7460 Unspecified cirrhosis of liver: Secondary | ICD-10-CM

## 2014-05-31 DIAGNOSIS — K7682 Hepatic encephalopathy: Secondary | ICD-10-CM

## 2014-05-31 DIAGNOSIS — K50111 Crohn's disease of large intestine with rectal bleeding: Secondary | ICD-10-CM

## 2014-05-31 DIAGNOSIS — I609 Nontraumatic subarachnoid hemorrhage, unspecified: Secondary | ICD-10-CM

## 2014-05-31 LAB — URINALYSIS, ROUTINE W REFLEX MICROSCOPIC
Bilirubin Urine: NEGATIVE
Ketones, ur: NEGATIVE
NITRITE: NEGATIVE
SPECIFIC GRAVITY, URINE: 1.01 (ref 1.000–1.030)
Total Protein, Urine: NEGATIVE
UROBILINOGEN UA: 1 (ref 0.0–1.0)
Urine Glucose: 500 — AB
pH: 7 (ref 5.0–8.0)

## 2014-05-31 LAB — CBC WITH DIFFERENTIAL/PLATELET
BASOS PCT: 0.3 % (ref 0.0–3.0)
Basophils Absolute: 0 10*3/uL (ref 0.0–0.1)
EOS PCT: 4.5 % (ref 0.0–5.0)
Eosinophils Absolute: 0.2 10*3/uL (ref 0.0–0.7)
HCT: 29.7 % — ABNORMAL LOW (ref 36.0–46.0)
Hemoglobin: 10 g/dL — ABNORMAL LOW (ref 12.0–15.0)
Lymphocytes Relative: 27.1 % (ref 12.0–46.0)
Lymphs Abs: 1.1 10*3/uL (ref 0.7–4.0)
MCHC: 33.6 g/dL (ref 30.0–36.0)
MCV: 95 fl (ref 78.0–100.0)
MONO ABS: 0.3 10*3/uL (ref 0.1–1.0)
Monocytes Relative: 7 % (ref 3.0–12.0)
NEUTROS ABS: 2.5 10*3/uL (ref 1.4–7.7)
NEUTROS PCT: 61.1 % (ref 43.0–77.0)
Platelets: 46 10*3/uL — CL (ref 150.0–400.0)
RBC: 3.13 Mil/uL — AB (ref 3.87–5.11)
RDW: 17.3 % — ABNORMAL HIGH (ref 11.5–15.5)
WBC: 4.1 10*3/uL (ref 4.0–10.5)

## 2014-05-31 LAB — PROTIME-INR
INR: 1.5 ratio — ABNORMAL HIGH (ref 0.8–1.0)
Prothrombin Time: 16.3 s — ABNORMAL HIGH (ref 9.6–13.1)

## 2014-05-31 LAB — BASIC METABOLIC PANEL
BUN: 11 mg/dL (ref 6–23)
CO2: 26 mEq/L (ref 19–32)
CREATININE: 1.2 mg/dL (ref 0.4–1.2)
Calcium: 8.8 mg/dL (ref 8.4–10.5)
Chloride: 104 mEq/L (ref 96–112)
GFR: 50.1 mL/min — ABNORMAL LOW (ref >60.00)
GLUCOSE: 179 mg/dL — AB (ref 70–99)
Potassium: 2.6 mEq/L — CL (ref 3.5–5.1)
Sodium: 136 mEq/L (ref 135–145)

## 2014-05-31 LAB — HEPATIC FUNCTION PANEL
ALK PHOS: 79 U/L (ref 39–117)
ALT: 20 U/L (ref 0–35)
AST: 39 U/L — AB (ref 0–37)
Albumin: 1.9 g/dL — ABNORMAL LOW (ref 3.5–5.2)
BILIRUBIN DIRECT: 1.1 mg/dL — AB (ref 0.0–0.3)
TOTAL PROTEIN: 6.2 g/dL (ref 6.0–8.3)
Total Bilirubin: 3 mg/dL — ABNORMAL HIGH (ref 0.2–1.2)

## 2014-05-31 LAB — AMMONIA: Ammonia: 68 umol/L — ABNORMAL HIGH (ref 11–35)

## 2014-05-31 LAB — VITAMIN B12: VITAMIN B 12: 1370 pg/mL — AB (ref 211–911)

## 2014-05-31 LAB — TSH: TSH: 4.85 u[IU]/mL — ABNORMAL HIGH (ref 0.35–4.50)

## 2014-05-31 LAB — HEMOGLOBIN A1C: Hgb A1c MFr Bld: 7.3 % — ABNORMAL HIGH (ref 4.6–6.5)

## 2014-05-31 LAB — MAGNESIUM: Magnesium: 1 mg/dL — CL (ref 1.5–2.5)

## 2014-05-31 MED ORDER — BUPROPION HCL ER (SR) 150 MG PO TB12: 150.0000 mg | ORAL_TABLET | Freq: Every day | ORAL | Status: DC

## 2014-05-31 MED ORDER — SPIRONOLACTONE 100 MG PO TABS: 100.0000 mg | ORAL_TABLET | Freq: Every day | ORAL | Status: AC

## 2014-05-31 MED ORDER — CEFUROXIME AXETIL 250 MG PO TABS: 250.0000 mg | ORAL_TABLET | Freq: Two times a day (BID) | ORAL | Status: AC

## 2014-05-31 NOTE — Assessment & Plan Note (Signed)
Continue with current prescription therapy as reflected on the Med list.  

## 2014-05-31 NOTE — Assessment & Plan Note (Signed)
Chronic. Discussed w/pt. Re- start Rx A need for supervision was discussed

## 2014-05-31 NOTE — Assessment & Plan Note (Signed)
Labs May need a brain CT Options for care discussed (moving in w/sister, Assisted Living, etc) were discussed w/pt and [name]. They are inclined to move [place] to [place] to live w/her sister

## 2014-05-31 NOTE — Assessment & Plan Note (Signed)
H/o recent bleeding in [place] 8/15

## 2014-05-31 NOTE — Telephone Encounter (Signed)
Noted. I'm sorry to hear that [name] is not doing well.   Pt and family need to initiate Assisted living placement process. I'll fill out paperwork when needed.  [name] was supposed to be taking Celexa 10 mg/d for depression.  Thx!

## 2014-05-31 NOTE — Progress Notes (Signed)
Pre visit review using our clinic review tool, if applicable. No additional management support is needed unless otherwise documented below in the visit note. 

## 2014-05-31 NOTE — Progress Notes (Signed)
Subjective:   C/o confusion and weakness over past few days at home. Not eating well   Per pt's Sister-- Ms Lurline Hare stated in tel note that the pt has been staying with her in [place] for 5 weeks. She feels that patient is not safe staying home alone any longer. Mr. Kennebrew is working and at times patient is disoriented and unresponsive. While she was in [place] she was having rectal bleeding. She states she is not eating right and she is not taking her meds properly.     HPI   F/u R foot injury:  heater fell on it 1 month ago - not getting any better. C/o pain and swelling of th top of R foot, still bruised. Broken plate - no need for surgery (1/15)  F/u B leg cramps R>L ...   The patient presents for a follow-up of chronic cirrhosis, hypertension, chronic dyslipidemia, type 2 diabetes controlled with medicines.     Wt Readings from Last 3 Encounters:  05/31/14 186 lb (84.369 kg)  04/03/14 182 lb (82.555 kg)  04/02/14 184 lb 11.9 oz (83.8 kg)   BP Readings from Last 3 Encounters:  05/31/14 120/68  04/03/14 130/70  04/02/14 132/80        Review of Systems  Constitutional: Positive for fatigue. Negative for chills, activity change, appetite change and unexpected weight change.  HENT: Negative for mouth sores.   Eyes: Negative for visual disturbance.  Respiratory: Negative for chest tightness.   Genitourinary: Negative for frequency, difficulty urinating and vaginal pain.  Musculoskeletal: Positive for arthralgias and gait problem.  Skin: Negative for pallor and rash.  Neurological: Positive for light-headedness. Negative for tremors.  Hematological: Negative for adenopathy. Bruises/bleeds easily.  Psychiatric/Behavioral: Positive for decreased concentration. Negative for suicidal ideas, confusion and sleep disturbance. The patient is nervous/anxious.        Objective:   Physical Exam  Constitutional: She appears well-developed. No distress.  obese  HENT:  Head:  Normocephalic.  Right Ear: External ear normal.  Left Ear: External ear normal.  Nose: Nose normal.  Mouth/Throat: Oropharynx is clear and moist.  Eyes: Conjunctivae are normal. Pupils are equal, round, and reactive to light. Right eye exhibits no discharge. Left eye exhibits no discharge.  Neck: Normal range of motion. Neck supple. No JVD present. No tracheal deviation present. No thyromegaly present.  Cardiovascular: Normal rate, regular rhythm and normal heart sounds.   Pulmonary/Chest: No stridor. No respiratory distress. She has no wheezes.  Abdominal: Soft. Bowel sounds are normal. She exhibits no distension and no mass. There is no tenderness. There is no rebound and no guarding.  Musculoskeletal: She exhibits edema (2+ B) and tenderness (B knees: R>L calf).  Lymphadenopathy:    She has no cervical adenopathy.  Neurological: She displays normal reflexes. No cranial nerve deficit. She exhibits normal muscle tone. Coordination abnormal.  Skin: No rash noted. No erythema. No pallor.  Psychiatric: She has a normal mood and affect. Her behavior is normal. Judgment and thought content normal.  No dry blood in nares     Lab Results  Component Value Date   WBC 3.1* 04/01/2014   HGB 10.8* 04/01/2014   HCT 31.9* 04/01/2014   PLT 18* 04/01/2014   GLUCOSE 106* 04/02/2014   CHOL 159 06/13/2009   TRIG 37.0 06/13/2009   HDL 66.50 06/13/2009   LDLCALC 85 06/13/2009   ALT 35 03/06/2014   AST 38* 03/06/2014   NA 142 04/02/2014   K 3.8 04/02/2014  CL 106 04/02/2014   CREATININE 0.80 04/02/2014   BUN 9 04/02/2014   CO2 24 04/02/2014   TSH 0.564 03/30/2014   INR 1.5* 03/06/2014   HGBA1C 9.7* 03/30/2014     a complex case - multi-organ chronic failure     Assessment & Plan:

## 2014-05-31 NOTE — Assessment & Plan Note (Addendum)
R/o UTI - UA today UTI - start abx

## 2014-05-31 NOTE — Assessment & Plan Note (Signed)
Chronic, non-alcoholic. Advanced

## 2014-05-31 NOTE — Assessment & Plan Note (Signed)
Chronic - due to her liver cirrhosis CBC

## 2014-05-31 NOTE — Assessment & Plan Note (Signed)
Worse  Risks associated with treatment noncompliance were discussed. Compliance was encouraged. Re-start Rx 

## 2014-05-31 NOTE — Telephone Encounter (Signed)
Pt's Kcl is critical at 2.6, Mag is 0.99. MD informed. He wants pt to take Klor Con 10 meq 1 qid and make sure she has her Magnesium. I called pt- No answer/unable to leave vm. I called pharmacy and they can fill Magnesium now.  Pt also needs to have labs rechecked in 1 week per MD.

## 2014-05-31 NOTE — Assessment & Plan Note (Signed)
CBGs are OK

## 2014-05-31 NOTE — Assessment & Plan Note (Signed)
Meds w/caution

## 2014-05-31 NOTE — Assessment & Plan Note (Addendum)
Falls, frequent No recent bad falls CT brain if needed

## 2014-06-04 ENCOUNTER — Telehealth: Payer: Self-pay | Admitting: Internal Medicine

## 2014-06-04 NOTE — Telephone Encounter (Signed)
Spoke with patient and she states she is having stomach pain below the belly button. States it is like when she had ovaries but she no longer has ovaries. Informed patient her PCP has been calling her re: UTI/antibiotics. Offered her OV with extender this week. She declines the OV and states she is going to call her PCP.

## 2014-06-05 ENCOUNTER — Other Ambulatory Visit: Payer: Self-pay | Admitting: *Deleted

## 2014-06-05 MED ORDER — BUPROPION HCL ER (SR) 150 MG PO TB12
150.0000 mg | ORAL_TABLET | Freq: Every day | ORAL | Status: AC
Start: 2014-06-05 — End: ?

## 2014-06-08 ENCOUNTER — Encounter: Payer: Self-pay | Admitting: Internal Medicine

## 2014-06-08 ENCOUNTER — Telehealth: Payer: Self-pay | Admitting: Internal Medicine

## 2014-06-08 ENCOUNTER — Ambulatory Visit (INDEPENDENT_AMBULATORY_CARE_PROVIDER_SITE_OTHER): Payer: Medicare PPO | Admitting: Internal Medicine

## 2014-06-08 VITALS — BP 130/80 | HR 72 | Temp 98.4°F | Resp 16 | Wt 184.0 lb

## 2014-06-08 DIAGNOSIS — K529 Noninfective gastroenteritis and colitis, unspecified: Secondary | ICD-10-CM

## 2014-06-08 DIAGNOSIS — Z9119 Patient's noncompliance with other medical treatment and regimen: Secondary | ICD-10-CM

## 2014-06-08 DIAGNOSIS — Z66 Do not resuscitate: Secondary | ICD-10-CM

## 2014-06-08 DIAGNOSIS — R609 Edema, unspecified: Secondary | ICD-10-CM

## 2014-06-08 DIAGNOSIS — K5289 Other specified noninfective gastroenteritis and colitis: Secondary | ICD-10-CM

## 2014-06-08 DIAGNOSIS — K7460 Unspecified cirrhosis of liver: Secondary | ICD-10-CM

## 2014-06-08 DIAGNOSIS — M545 Low back pain, unspecified: Secondary | ICD-10-CM

## 2014-06-08 DIAGNOSIS — K7290 Hepatic failure, unspecified without coma: Secondary | ICD-10-CM

## 2014-06-08 DIAGNOSIS — K7682 Hepatic encephalopathy: Secondary | ICD-10-CM

## 2014-06-08 DIAGNOSIS — E119 Type 2 diabetes mellitus without complications: Secondary | ICD-10-CM

## 2014-06-08 DIAGNOSIS — D696 Thrombocytopenia, unspecified: Secondary | ICD-10-CM

## 2014-06-08 DIAGNOSIS — Z91199 Patient's noncompliance with other medical treatment and regimen due to unspecified reason: Secondary | ICD-10-CM

## 2014-06-08 MED ORDER — GABAPENTIN 400 MG PO CAPS: 400.0000 mg | ORAL_CAPSULE | Freq: Three times a day (TID) | ORAL | Status: DC | PRN

## 2014-06-08 MED ORDER — GABAPENTIN 400 MG PO CAPS: 400.0000 mg | ORAL_CAPSULE | Freq: Every day | ORAL | Status: AC

## 2014-06-08 NOTE — Telephone Encounter (Signed)
This is not a matter that we can address. It is between the patient, her sister and her spouse. This was discussed at her OV today.

## 2014-06-08 NOTE — Progress Notes (Signed)
Pre visit review using our clinic review tool, if applicable. No additional management support is needed unless otherwise documented below in the visit note. 

## 2014-06-08 NOTE — Telephone Encounter (Signed)
Pt's sister would like to talk to you about pt wanting to move to [facility] with her.  Her sister states she can't physically care for the pt.  She has more information she would like to share with you before pt's appointment today.

## 2014-06-08 NOTE — Assessment & Plan Note (Signed)
Continue with current prescription therapy as reflected on the Med list.  

## 2014-06-08 NOTE — Assessment & Plan Note (Signed)
Discussed again - needs a Mediset box

## 2014-06-08 NOTE — Assessment & Plan Note (Signed)
Better  Will reduce Gabapentin Monitor Ammonia

## 2014-06-08 NOTE — Assessment & Plan Note (Signed)
Neftali said she is not taking her Prednisone a while ago F/u w/Dr Olevia Perches

## 2014-06-08 NOTE — Assessment & Plan Note (Signed)
Risks associated with treatment noncompliance were discussed. Compliance was encouraged: take spironolactone daily

## 2014-06-08 NOTE — Progress Notes (Signed)
Subjective:   [name] is going to live w/her sister in [place] x 5-6 weeks on Sunday  F/u confusion and weakness recently at home - no relapse. Not eating well yet. Better w/less Gabapentin...   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  Per pt's Sister -recent hx-- [name] stated in tel note that the pt has been staying with her in [place] for 5 weeks. She felt that patient is not safe staying home alone any longer. [name] is working and at times patient is disoriented and unresponsive. While she was in [place] she was having rectal bleeding.  [name] is not eating right and she is not taking her meds properly.     HPI   F/u R foot injury:  heater fell on it 1 month ago - not getting any better. C/o pain and swelling of th top of R foot, still bruised. Broken plate - no need for surgery (1/15)  F/u B leg cramps R>L ...   The patient presents for a follow-up of chronic cirrhosis, hypertension, chronic dyslipidemia, type 2 diabetes controlled with medicines.     Wt Readings from Last 3 Encounters:  06/08/14 184 lb (83.462 kg)  05/31/14 186 lb (84.369 kg)  04/03/14 182 lb (82.555 kg)   BP Readings from Last 3 Encounters:  06/08/14 130/80  05/31/14 120/68  04/03/14 130/70        Review of Systems  Constitutional: Positive for fatigue. Negative for chills, activity change, appetite change and unexpected weight change.  HENT: Negative for mouth sores.   Eyes: Negative for visual disturbance.  Respiratory: Negative for chest tightness.   Genitourinary: Negative for frequency, difficulty urinating and vaginal pain.  Musculoskeletal: Positive for arthralgias and gait problem.  Skin: Negative for pallor and rash.  Neurological: Positive for light-headedness. Negative for tremors.  Hematological: Negative for adenopathy. Bruises/bleeds easily.  Psychiatric/Behavioral: Positive for decreased concentration. Negative for suicidal ideas, confusion and sleep disturbance. The  patient is nervous/anxious.        Objective:   Physical Exam  Constitutional: She appears well-developed. No distress.  obese  HENT:  Head: Normocephalic.  Right Ear: External ear normal.  Left Ear: External ear normal.  Nose: Nose normal.  Mouth/Throat: Oropharynx is clear and moist.  Eyes: Conjunctivae are normal. Pupils are equal, round, and reactive to light. Right eye exhibits no discharge. Left eye exhibits no discharge.  Neck: Normal range of motion. Neck supple. No JVD present. No tracheal deviation present. No thyromegaly present.  Cardiovascular: Normal rate, regular rhythm and normal heart sounds.   Pulmonary/Chest: No stridor. No respiratory distress. She has no wheezes.  Abdominal: Soft. Bowel sounds are normal. She exhibits no distension and no mass. There is no tenderness. There is no rebound and no guarding.  Musculoskeletal: She exhibits edema (2+ B) and tenderness (B knees: R>L calf).  Lymphadenopathy:    She has no cervical adenopathy.  Neurological: She displays normal reflexes. No cranial nerve deficit. She exhibits normal muscle tone. Coordination abnormal.  Skin: No rash noted. No erythema. No pallor.  Psychiatric: She has a normal mood and affect. Her behavior is normal. Judgment and thought content normal.  No dry blood in nares Looks better "DNR" status was discussed and confirmed    Lab Results  Component Value Date   WBC 4.1 05/31/2014   HGB 10.0* 05/31/2014   HCT 29.7* 05/31/2014   PLT 46.0 Repeated and verified X2.* 05/31/2014   GLUCOSE 179* 05/31/2014   CHOL  159 06/13/2009   TRIG 37.0 06/13/2009   HDL 66.50 06/13/2009   LDLCALC 85 06/13/2009   ALT 20 05/31/2014   AST 39* 05/31/2014   NA 136 05/31/2014   K 2.6* 05/31/2014   CL 104 05/31/2014   CREATININE 1.2 05/31/2014   BUN 11 05/31/2014   CO2 26 05/31/2014   TSH 4.85* 05/31/2014   INR 1.5* 05/31/2014   HGBA1C 7.3* 05/31/2014     a complex case - multi-organ chronic failure     Assessment & Plan:

## 2014-06-08 NOTE — Telephone Encounter (Signed)
OV today with PCP. Closing phone note.

## 2014-06-08 NOTE — Assessment & Plan Note (Addendum)
Chronic, non-alcoholic. Advanced No CPR per previous discussion Continue with current prescription therapy as reflected on the Med list.

## 2014-06-08 NOTE — Assessment & Plan Note (Signed)
8/15 

## 2014-07-03 ENCOUNTER — Telehealth: Payer: Self-pay | Admitting: Internal Medicine

## 2014-07-03 NOTE — Telephone Encounter (Signed)
The family wanted to let you know the patient passed away.

## 2014-07-05 DEATH — deceased

## 2014-07-10 ENCOUNTER — Ambulatory Visit: Payer: Medicare PPO | Admitting: Internal Medicine

## 2014-08-14 ENCOUNTER — Ambulatory Visit: Payer: Medicare PPO | Admitting: Internal Medicine

## 2014-11-06 ENCOUNTER — Telehealth: Payer: Self-pay

## 2014-11-06 NOTE — Telephone Encounter (Signed)
Patient died @ [facility] in [place] per Iver Nestle

## 2014-12-29 IMAGING — CR DG CHEST 2V
2 series · 2 of 2 positions shown · non-contrast
Comparison: DG CHEST 2 VIEW dated 06/24/2010

CLINICAL DATA: Left-sided chest pain rate could neck. Diabetes and
hypertension.

EXAM:
CHEST  2 VIEW

[w chest pa]
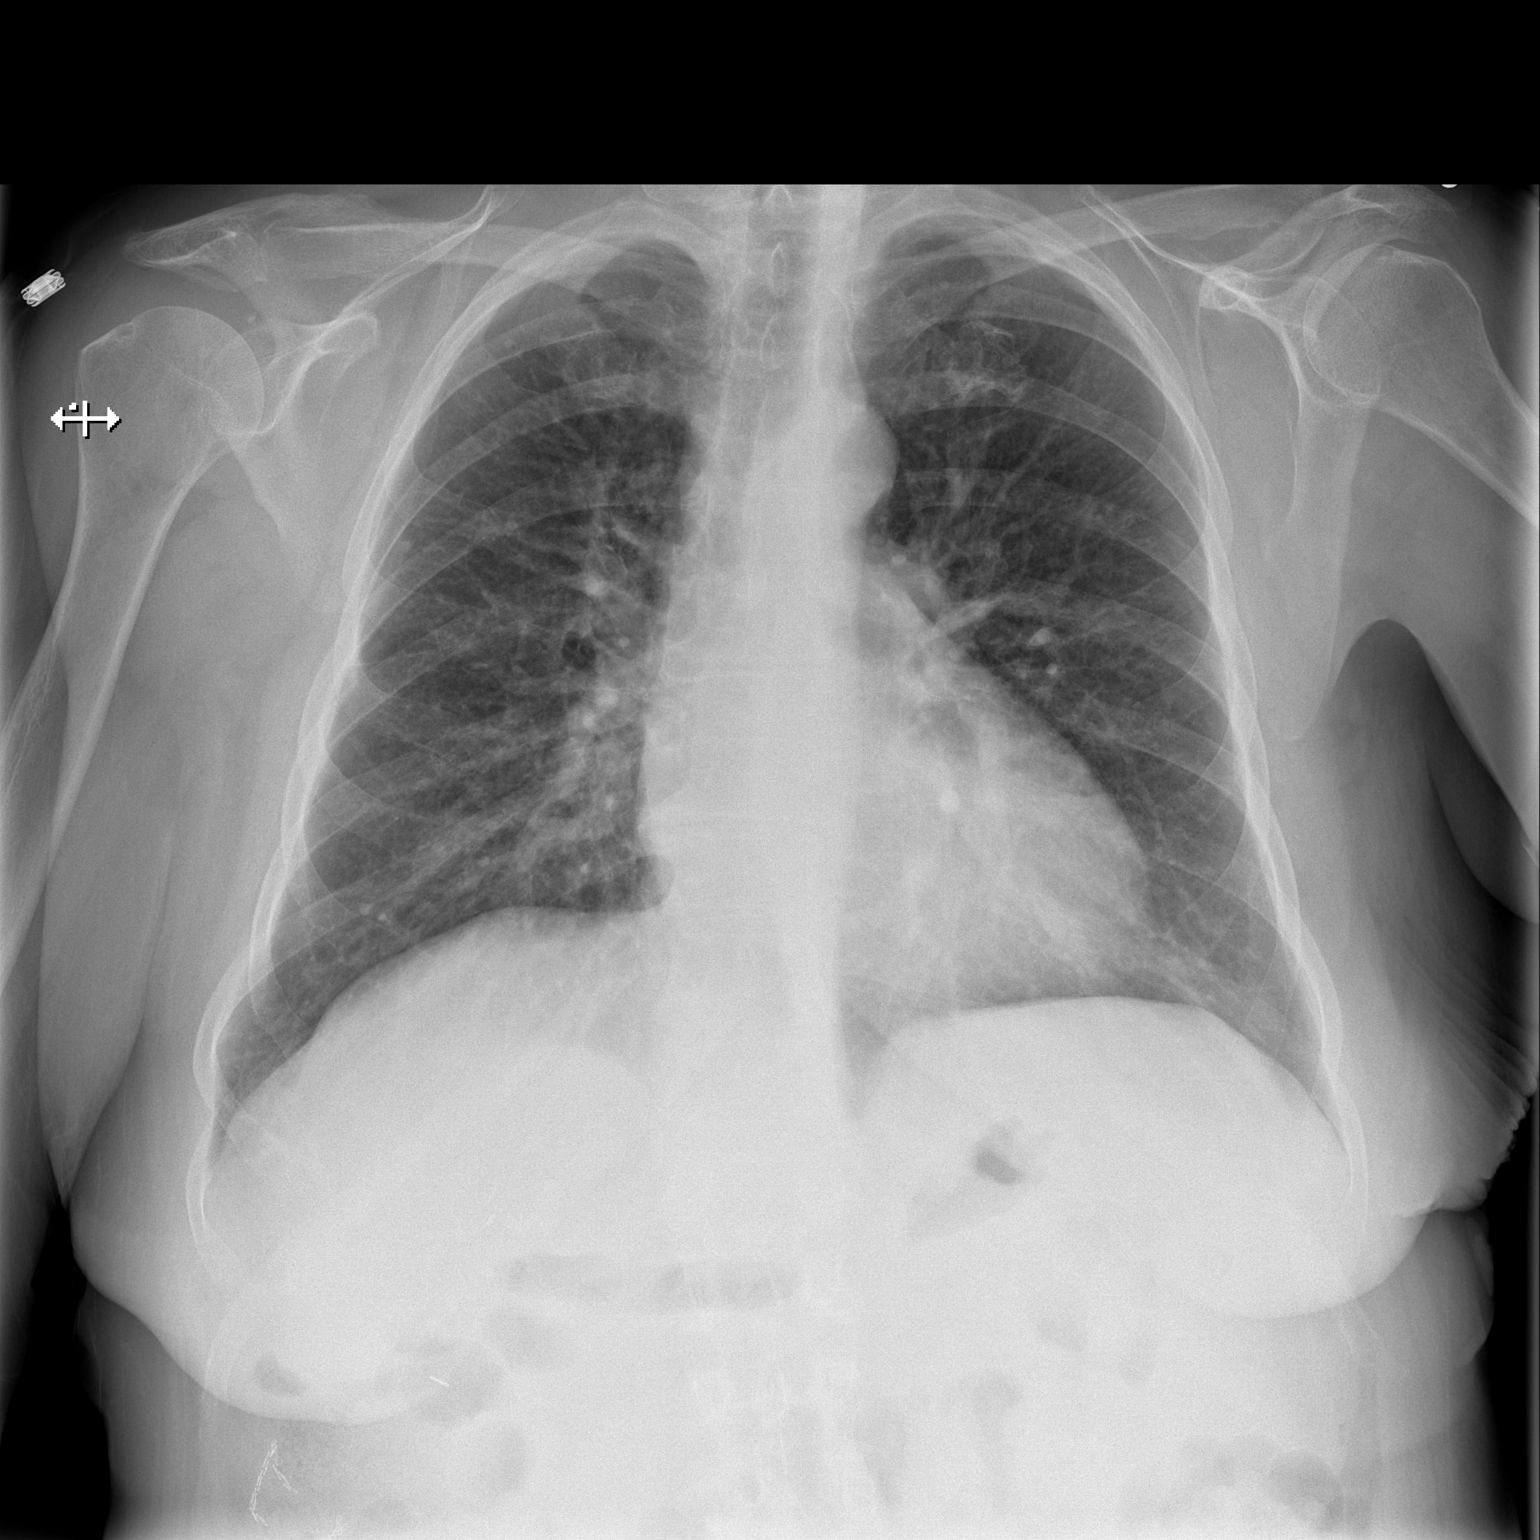

[w chest lat]
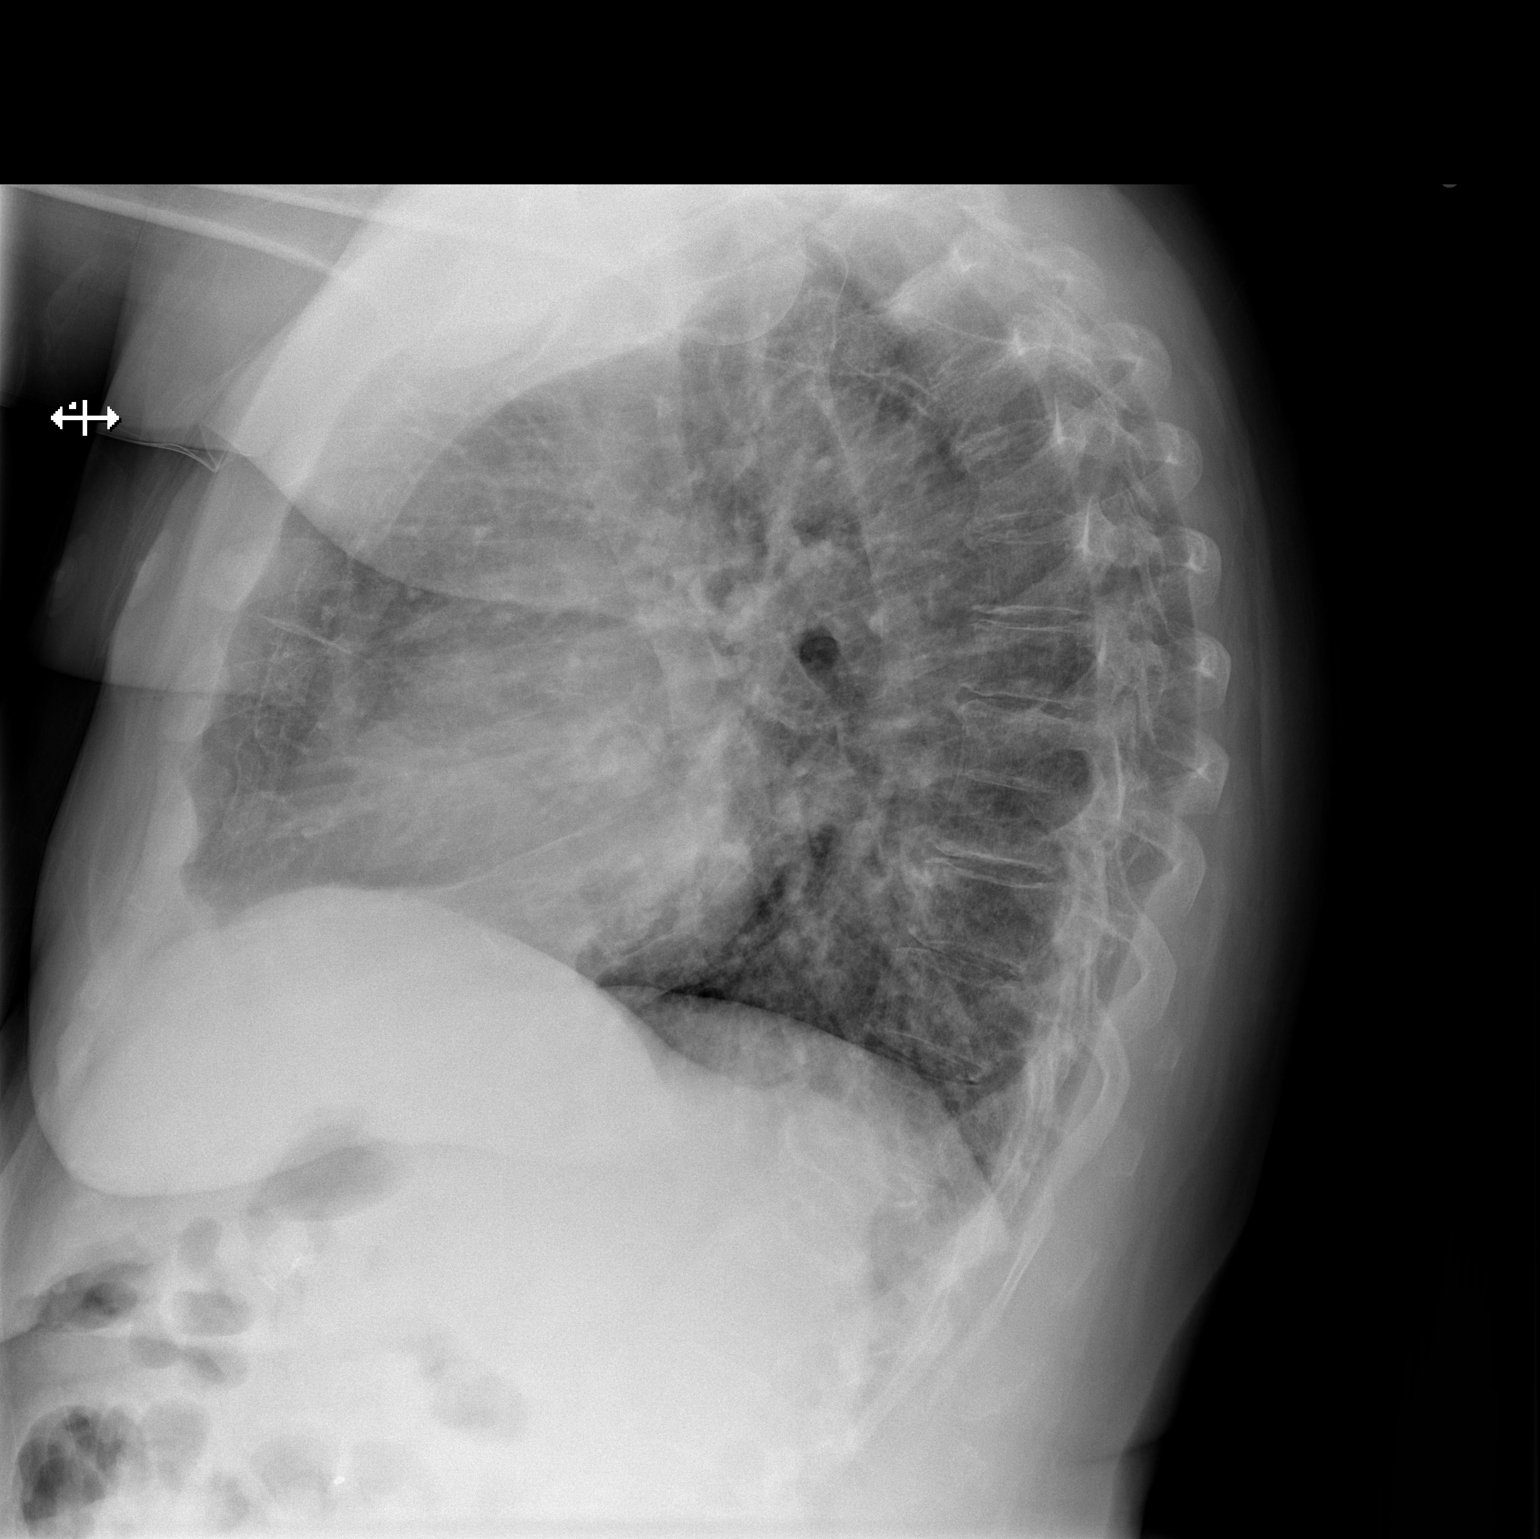

[2 of 2 positions shown; findings below may reference images not displayed]

FINDINGS: The heart size and mediastinal contours are within normal limits.
Both lungs are clear. The visualized skeletal structures are
unremarkable.
IMPRESSION: No active cardiopulmonary disease.

## 2015-05-30 IMAGING — CT CT HEAD W/O CM
1 series · 16 of 30 positions shown, 20 images · non-contrast
Comparison: 06/03/2010

CLINICAL DATA: Frequent falls, history cirrhosis, diabetes,
essential hypertension

EXAM:
CT HEAD WITHOUT CONTRAST
TECHNIQUE: Contiguous axial images were obtained from the base of the skull
through the vertex without intravenous contrast.

[Series 2: head 5.0 h30s · axial · 0.43mm/px · z∈[+32,+177]mm · 16 of 33 slices shown, 20 images]
[im 2/33  brain]
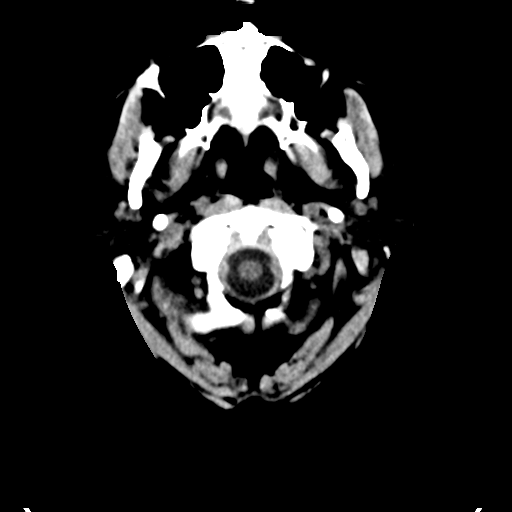
[im 2/33  bone]
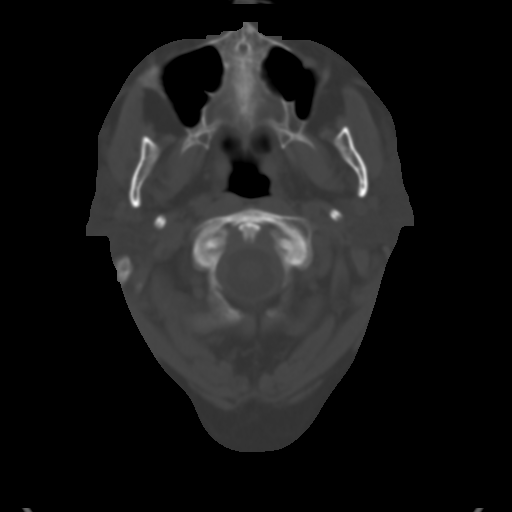
[im 4/33  brain]
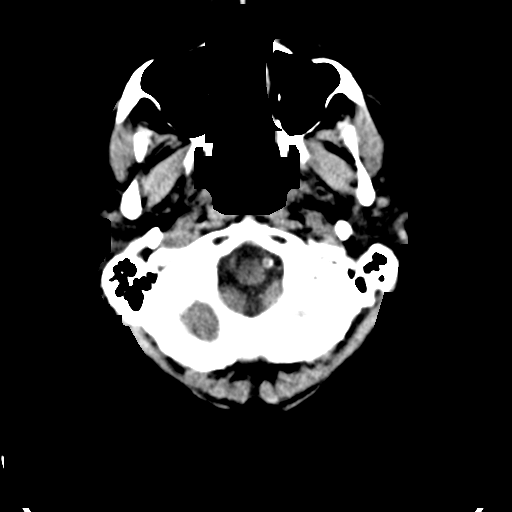
[im 6/33  brain]
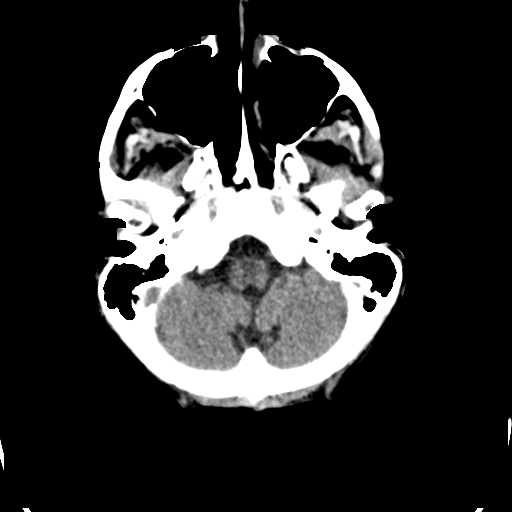
[im 8/33  brain]
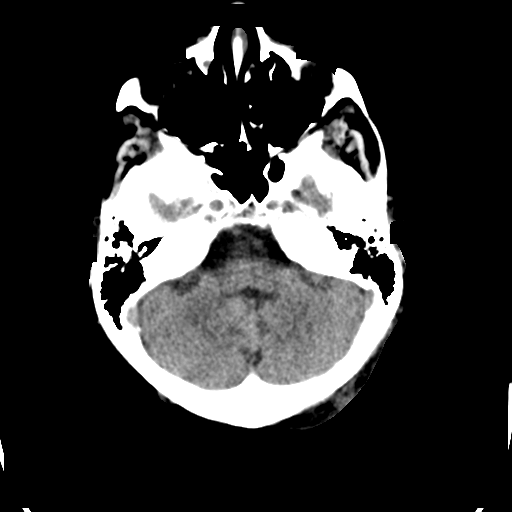
[im 9/33  brain]
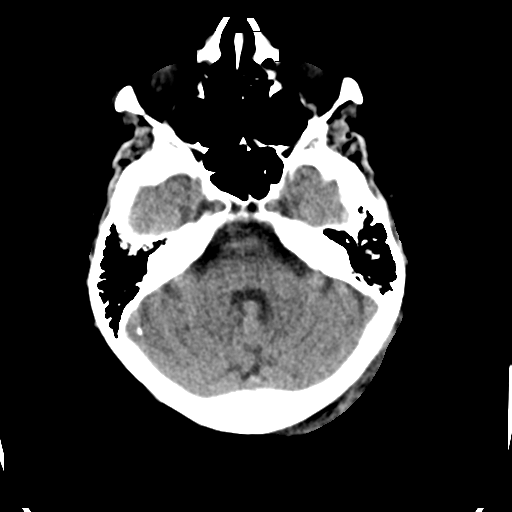
[im 9/33  bone]
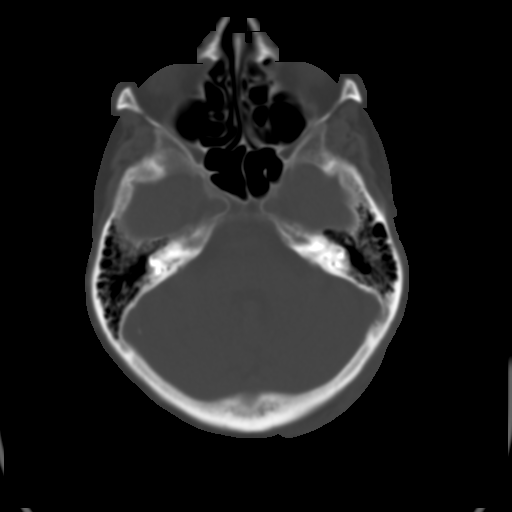
[im 12/33  brain]
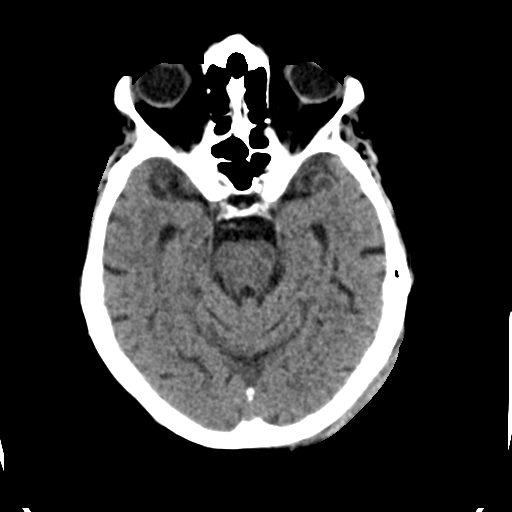
[im 14/33  brain]
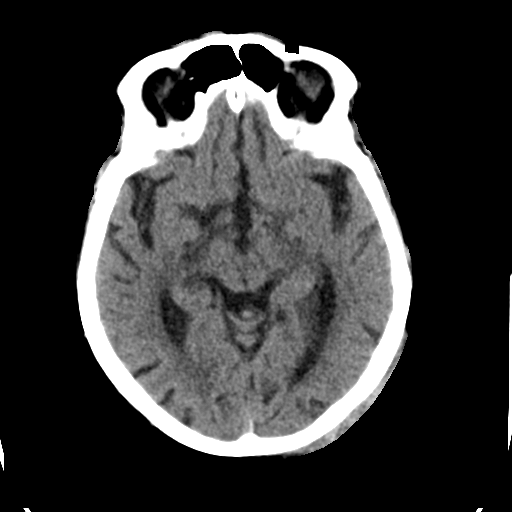
[im 16/33  brain]
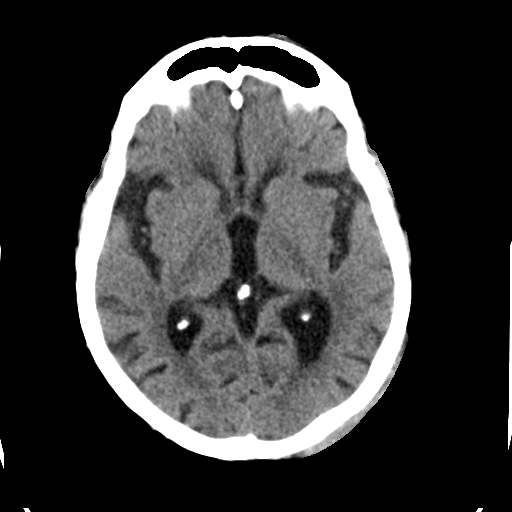
[im 17/33  brain]
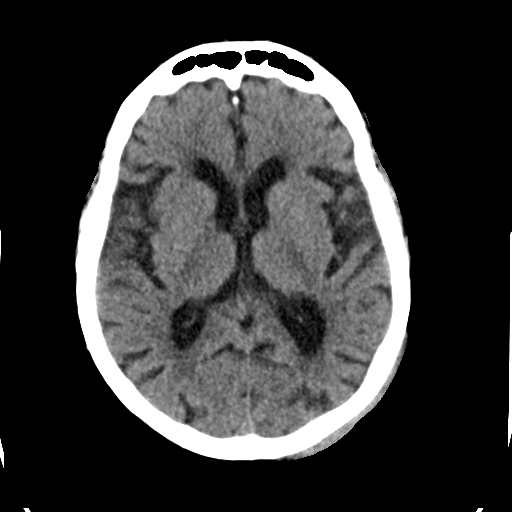
[im 17/33  bone]
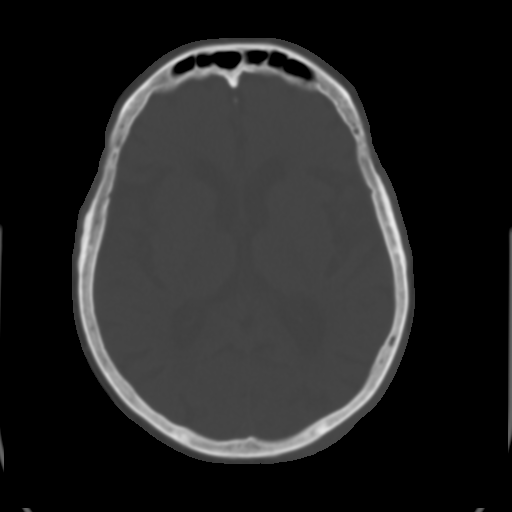
[im 19/33  brain]
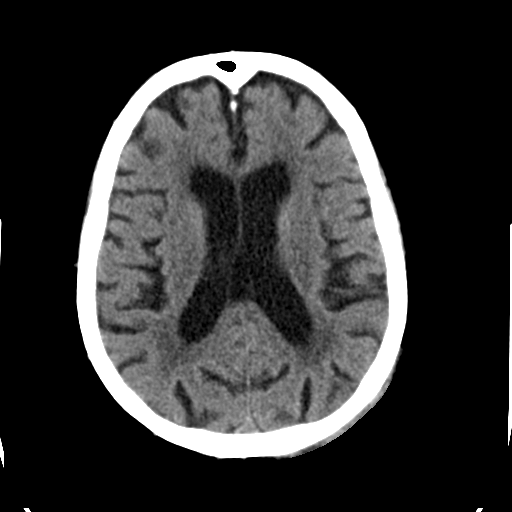
[im 21/33  brain]
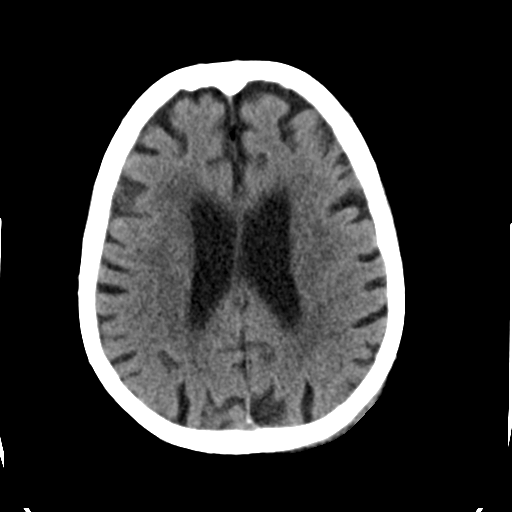
[im 24/33  brain]
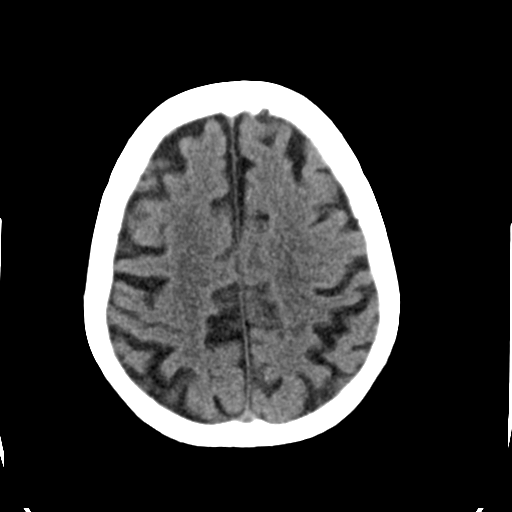
[im 25/33  brain]
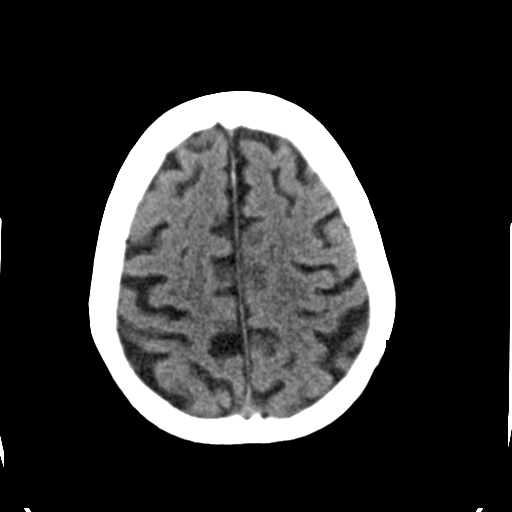
[im 25/33  bone]
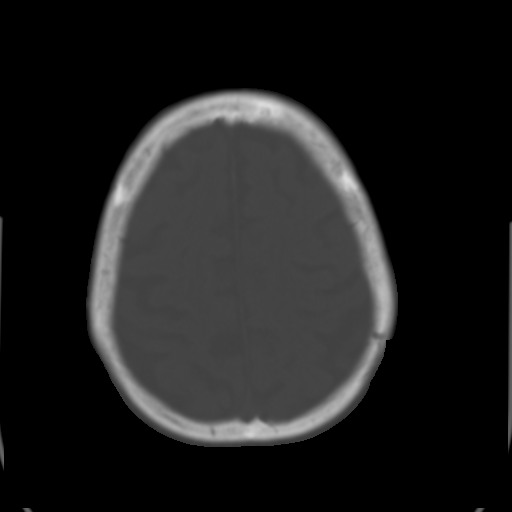
[im 27/33  brain]
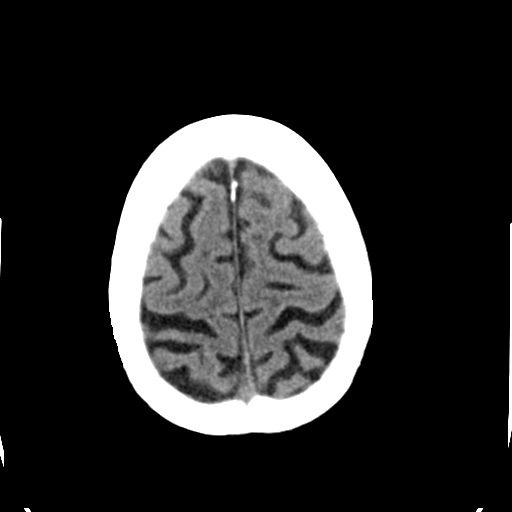
[im 29/33  brain]
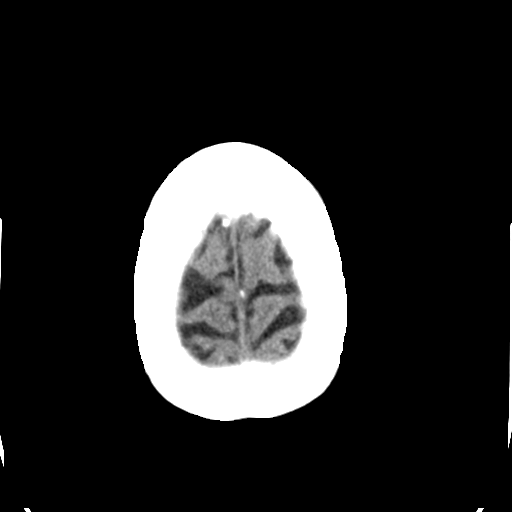
[im 31/33  brain]
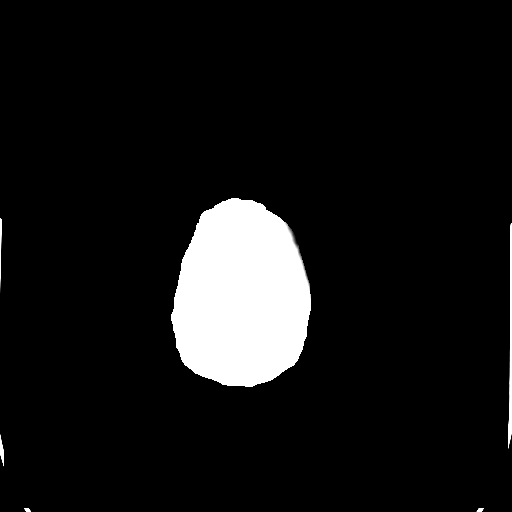

[16 of 30 positions shown; findings below may reference images not displayed]

FINDINGS: Generalized atrophy.

Normal ventricular morphology.

No midline shift or mass effect.

Small vessel chronic ischemic changes of deep cerebral white matter.

No intracranial hemorrhage, mass lesion, or acute infarction.

Visualized paranasal sinuses and mastoid air cells clear.

Bones unremarkable.

Atherosclerotic calcifications at skullbase.

LEFT temporo-occipital scalp hematoma.
IMPRESSION: [LEFT TEMPORO-OCCIPITAL SCALP HEMATOMA.:
IMPRESSION: [LEFT TEMPORO-OCCIPITAL SCALP HEMATOMA.
Atrophy with small vessel chronic ischemic changes of deep cerebral
white matter.

No acute intracranial abnormalities.

LEFT temporo-occipital scalp hematoma.

## 2015-05-30 IMAGING — CR DG CHEST 1V PORT
1 series · 1 of 1 positions shown · non-contrast
Comparison: Portable exam 4947 hr compared to 02/23/2014

CLINICAL DATA: Shortness of breath, weakness

EXAM:
PORTABLE CHEST - 1 VIEW

[AP]
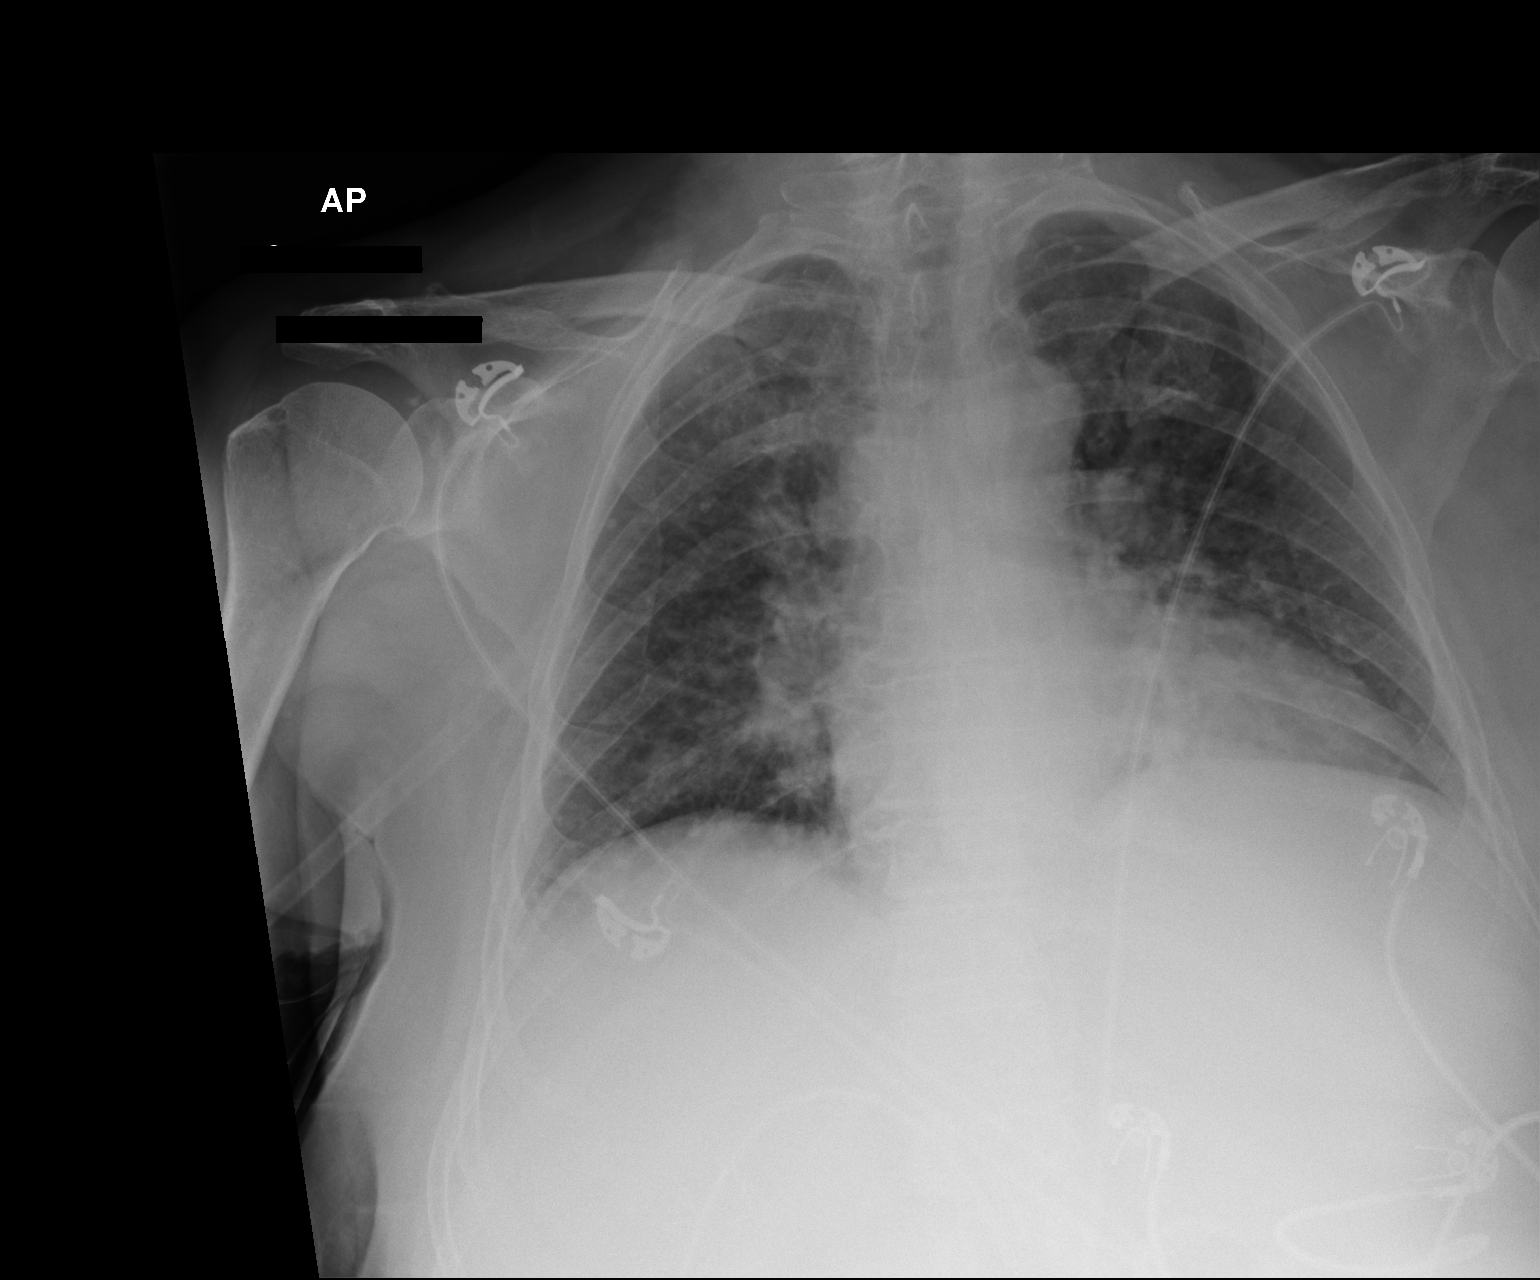

[1 of 1 positions shown; findings below may reference images not displayed]

FINDINGS: Enlargement of cardiac silhouette with mitral annular calcification.

Mediastinal contours and pulmonary vascularity normal.

Mild bibasilar atelectasis.

No acute failure or consolidation.

No pleural effusion or pneumothorax.

Bones unremarkable.
IMPRESSION: Enlargement of cardiac silhouette.

Mild bibasilar atelectasis.
